# Patient Record
Sex: Female | Born: 1955 | Race: Black or African American | Hispanic: No | Smoking: Never smoker
Health system: Southern US, Community
[De-identification: ages and names within clinical notes are randomized; demographics above are authoritative.]

## PROBLEM LIST (undated history)

## (undated) DIAGNOSIS — K635 Polyp of colon: Secondary | ICD-10-CM

## (undated) DIAGNOSIS — C55 Malignant neoplasm of uterus, part unspecified: Secondary | ICD-10-CM

## (undated) DIAGNOSIS — G629 Polyneuropathy, unspecified: Secondary | ICD-10-CM

## (undated) DIAGNOSIS — E119 Type 2 diabetes mellitus without complications: Secondary | ICD-10-CM

## (undated) DIAGNOSIS — I639 Cerebral infarction, unspecified: Secondary | ICD-10-CM

## (undated) HISTORY — DX: Cerebral infarction, unspecified: I63.9

## (undated) HISTORY — PX: OTHER SURGICAL HISTORY: SHX169

## (undated) HISTORY — PX: ABDOMINAL HYSTERECTOMY: SHX81

## (undated) HISTORY — PX: HERNIA REPAIR: SHX51

## (undated) HISTORY — DX: Polyp of colon: K63.5

## (undated) HISTORY — DX: Malignant neoplasm of uterus, part unspecified: C55

## (undated) HISTORY — DX: Polyneuropathy, unspecified: G62.9

---

## 2019-10-19 ENCOUNTER — Other Ambulatory Visit: Payer: Self-pay

## 2019-10-19 ENCOUNTER — Encounter (HOSPITAL_COMMUNITY): Payer: Self-pay

## 2019-10-19 ENCOUNTER — Inpatient Hospital Stay (HOSPITAL_COMMUNITY)
Admission: EM | Admit: 2019-10-19 | Discharge: 2019-10-24 | DRG: 041 | Disposition: A | Discharge status: Rehabilitation Facility | Payer: Medicare HMO | Attending: Internal Medicine | Admitting: Internal Medicine

## 2019-10-19 ENCOUNTER — Inpatient Hospital Stay (HOSPITAL_COMMUNITY): Payer: Medicare HMO

## 2019-10-19 ENCOUNTER — Emergency Department (HOSPITAL_COMMUNITY): Payer: Medicare HMO

## 2019-10-19 DIAGNOSIS — G629 Polyneuropathy, unspecified: Secondary | ICD-10-CM | POA: Diagnosis not present

## 2019-10-19 DIAGNOSIS — Z833 Family history of diabetes mellitus: Secondary | ICD-10-CM

## 2019-10-19 DIAGNOSIS — R29708 NIHSS score 8: Secondary | ICD-10-CM | POA: Diagnosis present

## 2019-10-19 DIAGNOSIS — R000 Tachycardia, unspecified: Secondary | ICD-10-CM

## 2019-10-19 DIAGNOSIS — Z8673 Personal history of transient ischemic attack (TIA), and cerebral infarction without residual deficits: Secondary | ICD-10-CM

## 2019-10-19 DIAGNOSIS — E1151 Type 2 diabetes mellitus with diabetic peripheral angiopathy without gangrene: Secondary | ICD-10-CM | POA: Diagnosis present

## 2019-10-19 DIAGNOSIS — H548 Legal blindness, as defined in USA: Secondary | ICD-10-CM | POA: Diagnosis present

## 2019-10-19 DIAGNOSIS — Z20822 Contact with and (suspected) exposure to covid-19: Secondary | ICD-10-CM | POA: Diagnosis present

## 2019-10-19 DIAGNOSIS — I63412 Cerebral infarction due to embolism of left middle cerebral artery: Secondary | ICD-10-CM | POA: Diagnosis present

## 2019-10-19 DIAGNOSIS — R509 Fever, unspecified: Secondary | ICD-10-CM

## 2019-10-19 DIAGNOSIS — I471 Supraventricular tachycardia: Secondary | ICD-10-CM | POA: Diagnosis present

## 2019-10-19 DIAGNOSIS — I69320 Aphasia following cerebral infarction: Secondary | ICD-10-CM | POA: Diagnosis not present

## 2019-10-19 DIAGNOSIS — Z803 Family history of malignant neoplasm of breast: Secondary | ICD-10-CM | POA: Diagnosis not present

## 2019-10-19 DIAGNOSIS — R296 Repeated falls: Secondary | ICD-10-CM | POA: Diagnosis present

## 2019-10-19 DIAGNOSIS — E1165 Type 2 diabetes mellitus with hyperglycemia: Secondary | ICD-10-CM | POA: Diagnosis not present

## 2019-10-19 DIAGNOSIS — M792 Neuralgia and neuritis, unspecified: Secondary | ICD-10-CM | POA: Diagnosis not present

## 2019-10-19 DIAGNOSIS — I639 Cerebral infarction, unspecified: Secondary | ICD-10-CM | POA: Diagnosis present

## 2019-10-19 DIAGNOSIS — Z8041 Family history of malignant neoplasm of ovary: Secondary | ICD-10-CM

## 2019-10-19 DIAGNOSIS — Z90710 Acquired absence of both cervix and uterus: Secondary | ICD-10-CM | POA: Diagnosis not present

## 2019-10-19 DIAGNOSIS — Z7902 Long term (current) use of antithrombotics/antiplatelets: Secondary | ICD-10-CM

## 2019-10-19 DIAGNOSIS — E785 Hyperlipidemia, unspecified: Secondary | ICD-10-CM | POA: Diagnosis present

## 2019-10-19 DIAGNOSIS — I10 Essential (primary) hypertension: Secondary | ICD-10-CM | POA: Diagnosis present

## 2019-10-19 DIAGNOSIS — E1142 Type 2 diabetes mellitus with diabetic polyneuropathy: Secondary | ICD-10-CM | POA: Diagnosis present

## 2019-10-19 DIAGNOSIS — D509 Iron deficiency anemia, unspecified: Secondary | ICD-10-CM

## 2019-10-19 DIAGNOSIS — I2510 Atherosclerotic heart disease of native coronary artery without angina pectoris: Secondary | ICD-10-CM | POA: Diagnosis present

## 2019-10-19 DIAGNOSIS — Z8541 Personal history of malignant neoplasm of cervix uteri: Secondary | ICD-10-CM | POA: Diagnosis not present

## 2019-10-19 DIAGNOSIS — D649 Anemia, unspecified: Secondary | ICD-10-CM | POA: Diagnosis present

## 2019-10-19 DIAGNOSIS — E119 Type 2 diabetes mellitus without complications: Secondary | ICD-10-CM

## 2019-10-19 DIAGNOSIS — R739 Hyperglycemia, unspecified: Secondary | ICD-10-CM | POA: Diagnosis not present

## 2019-10-19 DIAGNOSIS — I69351 Hemiplegia and hemiparesis following cerebral infarction affecting right dominant side: Secondary | ICD-10-CM

## 2019-10-19 DIAGNOSIS — Z79899 Other long term (current) drug therapy: Secondary | ICD-10-CM | POA: Diagnosis not present

## 2019-10-19 DIAGNOSIS — Z6841 Body Mass Index (BMI) 40.0 and over, adult: Secondary | ICD-10-CM

## 2019-10-19 DIAGNOSIS — I6300 Cerebral infarction due to thrombosis of unspecified precerebral artery: Secondary | ICD-10-CM | POA: Diagnosis not present

## 2019-10-19 DIAGNOSIS — R32 Unspecified urinary incontinence: Secondary | ICD-10-CM | POA: Diagnosis present

## 2019-10-19 DIAGNOSIS — R7309 Other abnormal glucose: Secondary | ICD-10-CM | POA: Diagnosis not present

## 2019-10-19 DIAGNOSIS — I6389 Other cerebral infarction: Secondary | ICD-10-CM | POA: Diagnosis not present

## 2019-10-19 DIAGNOSIS — Z794 Long term (current) use of insulin: Secondary | ICD-10-CM

## 2019-10-19 DIAGNOSIS — I63512 Cerebral infarction due to unspecified occlusion or stenosis of left middle cerebral artery: Secondary | ICD-10-CM | POA: Diagnosis not present

## 2019-10-19 DIAGNOSIS — I69354 Hemiplegia and hemiparesis following cerebral infarction affecting left non-dominant side: Secondary | ICD-10-CM

## 2019-10-19 DIAGNOSIS — E162 Hypoglycemia, unspecified: Secondary | ICD-10-CM | POA: Diagnosis not present

## 2019-10-19 DIAGNOSIS — E46 Unspecified protein-calorie malnutrition: Secondary | ICD-10-CM | POA: Diagnosis not present

## 2019-10-19 DIAGNOSIS — E8809 Other disorders of plasma-protein metabolism, not elsewhere classified: Secondary | ICD-10-CM | POA: Diagnosis not present

## 2019-10-19 DIAGNOSIS — Z87891 Personal history of nicotine dependence: Secondary | ICD-10-CM

## 2019-10-19 HISTORY — DX: Type 2 diabetes mellitus without complications: E11.9

## 2019-10-19 LAB — URINALYSIS, ROUTINE W REFLEX MICROSCOPIC
Bilirubin Urine: NEGATIVE
Glucose, UA: >=500 mg/dL — AB
Hgb urine dipstick: NEGATIVE
Ketones, ur: 5 mg/dL — AB
Leukocytes,Ua: NEGATIVE
Nitrite: NEGATIVE
Protein, ur: NEGATIVE mg/dL
Specific Gravity, Urine: 1.024 (ref 1.005–1.030)
pH: 6 (ref 5.0–8.0)

## 2019-10-19 LAB — DIFFERENTIAL
Abs Immature Granulocytes: 0.03 10*3/uL (ref 0.00–0.07)
Basophils Absolute: 0.1 10*3/uL (ref 0.0–0.1)
Basophils Relative: 1 %
Eosinophils Absolute: 0.2 10*3/uL (ref 0.0–0.5)
Eosinophils Relative: 3 %
Immature Granulocytes: 0 %
Lymphocytes Relative: 26 %
Lymphs Abs: 2.1 10*3/uL (ref 0.7–4.0)
Monocytes Absolute: 0.6 10*3/uL (ref 0.1–1.0)
Monocytes Relative: 8 %
Neutro Abs: 5 10*3/uL (ref 1.7–7.7)
Neutrophils Relative %: 62 %

## 2019-10-19 LAB — CBC
HCT: 37.6 % (ref 36.0–46.0)
Hemoglobin: 11.5 g/dL — ABNORMAL LOW (ref 12.0–15.0)
MCH: 25.3 pg — ABNORMAL LOW (ref 26.0–34.0)
MCHC: 30.6 g/dL (ref 30.0–36.0)
MCV: 82.6 fL (ref 80.0–100.0)
Platelets: 381 10*3/uL (ref 150–400)
RBC: 4.55 MIL/uL (ref 3.87–5.11)
RDW: 15.4 % (ref 11.5–15.5)
WBC: 8 10*3/uL (ref 4.0–10.5)
nRBC: 0 % (ref 0.0–0.2)

## 2019-10-19 LAB — RAPID URINE DRUG SCREEN, HOSP PERFORMED
Amphetamines: NOT DETECTED
Barbiturates: NOT DETECTED
Benzodiazepines: NOT DETECTED
Cocaine: NOT DETECTED
Opiates: NOT DETECTED
Tetrahydrocannabinol: NOT DETECTED

## 2019-10-19 LAB — COMPREHENSIVE METABOLIC PANEL
ALT: 17 U/L (ref 0–44)
AST: 17 U/L (ref 15–41)
Albumin: 3.3 g/dL — ABNORMAL LOW (ref 3.5–5.0)
Alkaline Phosphatase: 60 U/L (ref 38–126)
Anion gap: 10 (ref 5–15)
BUN: 12 mg/dL (ref 8–23)
CO2: 27 mmol/L (ref 22–32)
Calcium: 8.4 mg/dL — ABNORMAL LOW (ref 8.9–10.3)
Chloride: 100 mmol/L (ref 98–111)
Creatinine, Ser: 0.72 mg/dL (ref 0.44–1.00)
GFR calc Af Amer: >60 mL/min (ref >60)
GFR calc non Af Amer: >60 mL/min (ref >60)
Glucose, Bld: 371 mg/dL — ABNORMAL HIGH (ref 70–99)
Potassium: 4 mmol/L (ref 3.5–5.1)
Sodium: 137 mmol/L (ref 135–145)
Total Bilirubin: 0.5 mg/dL (ref 0.3–1.2)
Total Protein: 7.5 g/dL (ref 6.5–8.1)

## 2019-10-19 LAB — CBG MONITORING, ED: Glucose-Capillary: 282 mg/dL — ABNORMAL HIGH (ref 70–99)

## 2019-10-19 LAB — PROTIME-INR
INR: 1 (ref 0.8–1.2)
Prothrombin Time: 13.2 seconds (ref 11.4–15.2)

## 2019-10-19 LAB — APTT: aPTT: 28 seconds (ref 24–36)

## 2019-10-19 LAB — ETHANOL: Alcohol, Ethyl (B): <10 mg/dL (ref <10)

## 2019-10-19 MED ORDER — DULOXETINE HCL 60 MG PO CPEP
60.0000 mg | ORAL_CAPSULE | Freq: Every day | ORAL | Status: DC
  Administered 2019-10-19 – 2019-10-24 (×6): 60 mg via ORAL
  Filled 2019-10-19 (×2): qty 2
  Filled 2019-10-19 (×4): qty 1

## 2019-10-19 MED ORDER — GABAPENTIN 300 MG PO CAPS
600.0000 mg | ORAL_CAPSULE | Freq: Every day | ORAL | Status: DC
  Administered 2019-10-19 – 2019-10-23 (×5): 600 mg via ORAL
  Filled 2019-10-19 (×5): qty 2

## 2019-10-19 MED ORDER — INSULIN DETEMIR 100 UNIT/ML ~~LOC~~ SOLN
15.0000 [IU] | Freq: Every day | SUBCUTANEOUS | Status: DC
  Administered 2019-10-19 – 2019-10-23 (×5): 15 [IU] via SUBCUTANEOUS
  Filled 2019-10-19 (×7): qty 0.15

## 2019-10-19 MED ORDER — ENOXAPARIN SODIUM 40 MG/0.4ML ~~LOC~~ SOLN
40.0000 mg | Freq: Every day | SUBCUTANEOUS | Status: DC
  Administered 2019-10-19 – 2019-10-20 (×2): 40 mg via SUBCUTANEOUS
  Filled 2019-10-19 (×2): qty 0.4

## 2019-10-19 MED ORDER — ACETAMINOPHEN 325 MG PO TABS
650.0000 mg | ORAL_TABLET | ORAL | Status: DC | PRN
  Administered 2019-10-21 – 2019-10-24 (×8): 650 mg via ORAL
  Filled 2019-10-19 (×7): qty 2

## 2019-10-19 MED ORDER — CLOPIDOGREL BISULFATE 75 MG PO TABS
75.0000 mg | ORAL_TABLET | Freq: Every day | ORAL | Status: DC
  Administered 2019-10-19 – 2019-10-24 (×6): 75 mg via ORAL
  Filled 2019-10-19 (×6): qty 1

## 2019-10-19 MED ORDER — ACETAMINOPHEN 650 MG RE SUPP: 650.0000 mg | RECTAL | Status: DC | PRN

## 2019-10-19 MED ORDER — ATORVASTATIN CALCIUM 40 MG PO TABS
40.0000 mg | ORAL_TABLET | Freq: Every day | ORAL | Status: DC
  Administered 2019-10-19 – 2019-10-24 (×6): 40 mg via ORAL
  Filled 2019-10-19 (×7): qty 1

## 2019-10-19 MED ORDER — ACETAMINOPHEN 160 MG/5ML PO SOLN: 650.0000 mg | ORAL | Status: DC | PRN

## 2019-10-19 MED ORDER — IOHEXOL 350 MG/ML SOLN
100.0000 mL | Freq: Once | INTRAVENOUS | Status: AC | PRN
  Administered 2019-10-19: 100 mL via INTRAVENOUS

## 2019-10-19 MED ORDER — SODIUM CHLORIDE 0.9 % IV SOLN: INTRAVENOUS | Status: DC

## 2019-10-19 MED ORDER — OXYCODONE-ACETAMINOPHEN 5-325 MG PO TABS
2.0000 | ORAL_TABLET | Freq: Four times a day (QID) | ORAL | Status: DC | PRN
  Administered 2019-10-19 – 2019-10-23 (×4): 2 via ORAL
  Filled 2019-10-19 (×4): qty 2

## 2019-10-19 MED ORDER — INSULIN ASPART 100 UNIT/ML ~~LOC~~ SOLN
0.0000 [IU] | Freq: Three times a day (TID) | SUBCUTANEOUS | Status: DC
  Filled 2019-10-19: qty 0.2

## 2019-10-19 MED ORDER — AMITRIPTYLINE HCL 25 MG PO TABS
25.0000 mg | ORAL_TABLET | Freq: Every day | ORAL | Status: DC
  Administered 2019-10-19 – 2019-10-23 (×5): 25 mg via ORAL
  Filled 2019-10-19 (×5): qty 1

## 2019-10-19 MED ORDER — SODIUM CHLORIDE (PF) 0.9 % IJ SOLN
INTRAMUSCULAR | Status: AC
  Administered 2019-10-20: 1 mL
  Filled 2019-10-19: qty 50

## 2019-10-19 MED ORDER — SENNOSIDES-DOCUSATE SODIUM 8.6-50 MG PO TABS: 1.0000 | ORAL_TABLET | Freq: Every evening | ORAL | Status: DC | PRN

## 2019-10-19 MED ORDER — STROKE: EARLY STAGES OF RECOVERY BOOK
Freq: Once | Status: AC
  Filled 2019-10-19: qty 1

## 2019-10-19 NOTE — ED Notes (Signed)
Stroke swallow study was repeated at this time per pt. Request. This time she passed with ease.

## 2019-10-19 NOTE — ED Notes (Signed)
She has just returned from MRI and is in no distress. Her daughter remains with her.

## 2019-10-19 NOTE — ED Notes (Signed)
MRI called, no answer, will call again

## 2019-10-19 NOTE — ED Triage Notes (Signed)
Pt attempted to explain what brought in today but story was confusing. Pts family member reports pt has been confused, incontinence, and unable to control bowels over the last week. Pt has hx of stroke back in 2018.

## 2019-10-19 NOTE — H&P (Signed)
History and Physical    Regina Wolfe [id number] DOB: 1955-12-02 DOA: 10/19/2019  PCP: PCP in [place] Patient coming from: Home, lives in [place] with brother and sister.  Patient is a retired Marine scientist. I have personally briefly reviewed patient's old medical records in [place]  Chief Complaint: weakness, disorientation  HPI: Regina Wolfe is a 64 y.o. 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 Patient's daughter at bedside provides most of the history as she has mild short-term memory issues recalling her symptoms. About 2 weeks ago, daughter noticed that she would repeatedly fall off the bed due to weakness and started to have bowel/urinary incontinent in the bed.  When asked why the patient simply stated that she "cannot remember" to use the bathroom.  Then about a week ago she also began to have trouble word finding and seemed disoriented.  Patient normally is able to do all of her normal ADLs without assistance.  She normally ambulates with a cane. They then took her to an ER in [place] and was just told that she had a TIA and discharged home.  However they felt that she continued to be disoriented and decided to bring her here for better care.  Patient denies having any symptoms and just that she cannot recall what happened this past week.  Currently does not have any complaints of chest pain, palpitation or shortness of breath.  No headache.  No nausea, vomiting or diarrhea abdominal pain.  Has had good appetite.  ED Course: She was afebrile, hypertensive up to systolic of [id number] on room air.  No leukocytosis. Mild anemia with hemoglobin of 11.5 Glucose of 317.   MRI brain showed several acute/early subacture infarcts within the left frontal and  temporal lobes within the left MCA vascular territory.   ED physician discussed case with neurology and they would like her to have further embolic stroke work-up and be transferred to [facility].  Review of Systems:  Constitutional: No Weight Change, No Fever ENT/Mouth: No sore throat, No Rhinorrhea Eyes: No Eye Pain, No Vision Changes Cardiovascular: No Chest Pain, no SOB Respiratory: No Cough, No Sputum, No Wheezing, no Dyspnea  Gastrointestinal: No Nausea, No Vomiting, No Diarrhea, No Constipation, No Pain Genitourinary: + Urinary Incontinence, No Urgency, No Flank Pain Musculoskeletal: No Arthralgias, No Myalgias Skin: No Skin Lesions, No Pruritus, Neuro: +Weakness, No Numbness,  No Loss of Consciousness, No Syncope Psych: No Anxiety/Panic, No Depression, no decrease appetite Heme/Lymph: No Bruising, No Bleeding  Past Medical History:  Diagnosis Date   Diabetes mellitus without complication (HCC)    Stroke [facility])    Surgical history Hysterectomy secondary to cervical cancer C-section Heart catheter in 2006   Denies tobacco, alcohol illicit drug use.   Family history Mother-MI, type 2 diabetes Father-unknown Sisters-ovarian cancer, cervical cancer, leukemia Brother-pacemaker Aunt-breast cancer Maternal grandmother-brain cancer  Prior to Admission medications   Not on File    Physical Exam: Vitals:   10/19/19 1349 10/19/19 1715  BP: (!) 197/96 (!) 182/95  Pulse: (!) 118 97  Resp: 15 18  Temp: 98.4 F (36.9 C) (!) 97.5 F (36.4 C)  TempSrc: Oral Oral  SpO2: 100% 100%    Constitutional: NAD, calm, comfortable, well-appearing morbidly obese female laying at about 30 degree incline in bed Vitals:   10/19/19 1349 10/19/19  1715  BP: (!) 197/96 (!) 182/95  Pulse: (!) 118 97  Resp: 15 18  Temp: 98.4 F (36.9 C) (!) 97.5 F (36.4 C)  TempSrc: Oral Oral  SpO2: 100% 100%   Eyes: PERRL of the right eye but not the left due to legal blindness, lids and  conjunctivae normal ENMT: Mucous membranes are moist.  Neck: normal, supple, no masses, no thyromegaly Respiratory: clear to auscultation bilaterally, no wheezing, no crackles. Normal respiratory effort. No accessory muscle use.  Cardiovascular: Tachycardia with rates up to 110 on telemetry, no murmurs / rubs / gallops. No extremity edema. 2+ pedal pulses. No carotid bruits.  Abdomen: no tenderness, no masses palpated. Bowel sounds positive.  Musculoskeletal: no clubbing / cyanosis. No joint deformity upper and lower extremities. Good ROM, no contractures. Normal muscle tone.  Skin: no rashes, lesions, ulcers. No induration Neurologic: Alert and oriented x4 although unable to share recent symptoms, CN 2-12 grossly intact. Sensation intact. Strength 5/5 in all 4.  Able to name simple objects.  Normal finger-to-nose.  Intact heel-to-shin. Psychiatric: Normal judgment and insight. Alert and oriented x 3. Normal mood.     Labs on Admission: I have personally reviewed following labs and imaging studies  CBC: Recent Labs  Lab 10/19/19 1436  WBC 8.0  NEUTROABS 5.0  HGB 11.5*  HCT 37.6  MCV 82.6  PLT 123XX123   Basic Metabolic Panel: Recent Labs  Lab 10/19/19 1436  NA 137  K 4.0  CL 100  CO2 27  GLUCOSE 371*  BUN 12  CREATININE 0.72  CALCIUM 8.4*   GFR: CrCl cannot be calculated (Unknown ideal weight.). Liver Function Tests: Recent Labs  Lab 10/19/19 1436  AST 17  ALT 17  ALKPHOS 60  BILITOT 0.5  PROT 7.5  ALBUMIN 3.3*   No results for input(s): LIPASE, AMYLASE in the last 168 hours. No results for input(s): AMMONIA in the last 168 hours. Coagulation Profile: Recent Labs  Lab 10/19/19 1436  INR 1.0   Cardiac Enzymes: No results for input(s): CKTOTAL, CKMB, CKMBINDEX, TROPONINI in the last 168 hours. BNP (last 3 results) No results for input(s): PROBNP in the last 8760 hours. HbA1C: No results for input(s): HGBA1C in the last 72 hours. CBG: Recent Labs  Lab  10/19/19 1754  GLUCAP 282*   Lipid Profile: No results for input(s): CHOL, HDL, LDLCALC, TRIG, CHOLHDL, LDLDIRECT in the last 72 hours. Thyroid Function Tests: No results for input(s): TSH, T4TOTAL, FREET4, T3FREE, THYROIDAB in the last 72 hours. Anemia Panel: No results for input(s): VITAMINB12, FOLATE, FERRITIN, TIBC, IRON, RETICCTPCT in the last 72 hours. Urine analysis:    Component Value Date/Time   COLORURINE YELLOW 10/19/2019 1712   APPEARANCEUR CLEAR 10/19/2019 1712   LABSPEC 1.024 10/19/2019 1712   PHURINE 6.0 10/19/2019 1712   GLUCOSEU >=500 (A) 10/19/2019 1712   HGBUR NEGATIVE 10/19/2019 1712   BILIRUBINUR NEGATIVE 10/19/2019 1712   KETONESUR 5 (A) 10/19/2019 1712   PROTEINUR NEGATIVE 10/19/2019 1712   NITRITE NEGATIVE 10/19/2019 1712   LEUKOCYTESUR NEGATIVE 10/19/2019 1712    Radiological Exams on Admission: MR BRAIN WO CONTRAST  Result Date: 10/19/2019 CLINICAL DATA:  Focal neuro deficit, greater than 6 hours, stroke suspected. Additional history provided: Confusion, incontinence, history of stroke in 2018 EXAM: MRI HEAD WITHOUT CONTRAST TECHNIQUE: Multiplanar, multiecho pulse sequences of the brain and surrounding structures were obtained without intravenous contrast. COMPARISON:  No pertinent prior studies available for comparison. FINDINGS: Brain: Intermittently motion degraded examination. Most notably there  is moderate motion degradation of the axial T2 FLAIR and coronal T2 weighted sequences. There are multiple subcentimeter acute/early subacute infarcts within the left MCA vascular territory within the anterior left frontal lobe subcortical white matter (series 11, image 42) and a cortical/subcortical focus within the left frontal lobe motor strip (series 11, image 43), as well as within the anterior left temporal lobe (series 11, image 35). There is an additional subcentimeter acute/early subacute infarct within the body of the corpus callosum (series 11, image 41).  Background advanced patchy and confluent T2/FLAIR hyperintensity within the cerebral white matter and pons is nonspecific, but consistent with chronic small vessel ischemic disease. There are also numerous chronic lacunar infarcts within the bilateral cerebral white matter, basal ganglia, thalami and within the pons. There is also a focus of encephalomalacia within the anterior body/genu of the corpus callosum which may be post ischemic. No evidence of intracranial mass. No midline shift or extra-axial fluid collection. No chronic intracranial blood products. Moderate generalized parenchymal atrophy. Vascular: Flow voids maintained within the proximal large arterial vessels. Skull and upper cervical spine: No focal marrow lesion. Incompletely assessed upper cervical spondylosis. This includes a C3-C4 disc bulge partially effacing the ventral thecal sac and possibly contacting the ventral spinal cord. Sinuses/Orbits: Visualized orbits demonstrate no acute abnormality. No significant paranasal sinus disease or mastoid effusion. IMPRESSION: Intermittently motion degraded examination. Several subcentimeter acute/early subacute infarcts within the left frontal and temporal lobes within the left MCA vascular territory. There is an additional small acute/early subacute infarct within the body of the corpus callosum. Correlate for an embolic phenomenon, possibly central or within the left carotid system. Background advanced chronic small vessel ischemic disease with multiple chronic lacunar infarcts as described. Moderate generalized parenchymal atrophy. Electronically Signed   By: Kellie Simmering DO   On: 10/19/2019 16:53    EKG: Independently reviewed.   Assessment/Plan  Several acute/early subacute infarct of the left frontal and temporal lobe within the left MCA vascular territory with hx of CVA on Plavix   - CTA head and neck - echocardiogram  - continue Plavix and atorvastatin -Obtain A1c and  lipids -PT/OT/SLT -Frequent neuro checks and keep on telemetry -Allow for permissive hypertension with blood pressure treatment as needed only if systolic goes above [id number]  Tachycardia Unknown cause- due to stroke?Marland Kitchen No cardiac symptoms.  Keep on telemetry Will give 75cc IV fluid continuous  HTN allow for permissive HTN for stroke work up  Hold losartan and Coreg  Mild anemia Hgb of 11.5 Will repeat in the morning before further workup  Insulin-dependent type 2 diabetes with peripheral neuropathy Reports taking Humalog 30qHS, Levermir 15qHS and a sliding scale  continue with Levermir 15qHS and resistant SSI  Continue gabapentin, amitriptyline, duloxetine Continue home PRN Percocet  History of CAD No ischemic changes on EKG  DVT prophylaxis:.Lovenox Code Status: Full Family Communication: Plan discussed with patient daughter at bedside  disposition Plan: Home with at least 2 midnight stays  Consults called:  Admission status: inpatient with transportation to [facility] for further neurological work-up of suspected embolic stroke.  Orene Desanctis DO [facility]   If 7PM-7AM, please contact night-coverage [email or web address]   10/19/2019, 7:19 PM

## 2019-10-19 NOTE — ED Provider Notes (Signed)
[facility] DEPT Provider Note   CSN: [id number] Arrival date & time: 10/19/19  1342     History Chief Complaint  Patient presents with  . Altered Mental Status    Regina Wolfe is a 64 y.o. female.  HPI   Patient has a history of diabetes as well as a prior stroke in 2018.  This left the patient with some memory deficits but without other significant functional deficits according to the patient and her daughter.  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  The last couple of weeks however the daughter has noticed a significant change in the patient's behavior.  She has had some episodes of fecal and urinary incontinence.  Patient also has seemed very confused.  At times her speech has been very nonsensical.  She is able to speak clearly but the sentence structure does not take any sense.  The symptoms had been waxing and waning slightly but increasing in severity.  Family was with concerned and brought her to her local hospital in [facility].  Patient states she had an evaluation yesterday that included a CT scan.  They were told it is possible she might of had a TIA and recommended she start taking Plavix.  Family was concerned about the uncertainty of the diagnosis and so they brought her here for further evaluation.  Past Medical History:  Diagnosis Date  . Diabetes mellitus without complication ([place])   . Stroke [facility])     There are no problems to display for this patient.   History reviewed. No pertinent surgical history.   OB History   No obstetric history on file.     History reviewed. No pertinent family history.  Social History   Tobacco Use  . Smoking status: Not on file  Substance Use Topics  . Alcohol use: Not on file  . Drug use: Not on file    Home Medications Prior to Admission medications   Not on File    Allergies    Patient has no allergy information on  record.  Review of Systems   Review of Systems  All other systems reviewed and are negative.   Physical Exam Updated Vital Signs BP (!) 182/95 (BP Location: Left Arm)   Pulse 97   Temp (!) 97.5 F (36.4 C) (Oral)   Resp 18   SpO2 100%   Physical Exam Vitals and nursing note reviewed.  Constitutional:      General: She is not in acute distress.    Appearance: She is well-developed.  HENT:     Head: Normocephalic and atraumatic.     Right Ear: External ear normal.     Left Ear: External ear normal.  Eyes:     General: Visual field deficit present. No scleral icterus.       Right eye: No discharge.        Left eye: No discharge.     Conjunctiva/sclera: Conjunctivae normal.     Comments: Blind left eye (chronic per daughter)  Neck:     Trachea: No tracheal deviation.  Cardiovascular:     Rate and Rhythm: Normal rate and regular rhythm.  Pulmonary:     Effort: Pulmonary effort is normal. No respiratory distress.     Breath sounds: Normal breath sounds. No stridor. No wheezing or rales.  Abdominal:     General: Bowel sounds are normal. There is no distension.     Palpations:  Abdomen is soft.     Tenderness: There is no abdominal tenderness. There is no guarding or rebound.  Musculoskeletal:        General: No tenderness.     Cervical back: Neck supple.  Skin:    General: Skin is warm and dry.     Findings: No rash.  Neurological:     Mental Status: She is alert. She is disoriented.     GCS: GCS eye subscore is 4. GCS verbal subscore is 4. GCS motor subscore is 6.     Cranial Nerves: No cranial nerve deficit (no facial droop, extraocular movements intact, no slurred speech).     Sensory: No sensory deficit.     Motor: No weakness, abnormal muscle tone or seizure activity.     Coordination: Coordination abnormal.     Comments: No pronator drift bilateral upper extrem, able to hold both legs off bed for 5 seconds, sensation intact in all extremities,  no left or right  sided neglect, abnormal finger-nose exam bilaterally, no nystagmus noted      ED Results / Procedures / Treatments   Labs (all labs ordered are listed, but only abnormal results are displayed) Labs Reviewed  CBC - Abnormal; Notable for the following components:      Result Value   Hemoglobin 11.5 (*)    MCH 25.3 (*)    All other components within normal limits  COMPREHENSIVE METABOLIC PANEL - Abnormal; Notable for the following components:   Glucose, Bld 371 (*)    Calcium 8.4 (*)    Albumin 3.3 (*)    All other components within normal limits  URINALYSIS, ROUTINE W REFLEX MICROSCOPIC - Abnormal; Notable for the following components:   Glucose, UA >=500 (*)    Ketones, ur 5 (*)    Bacteria, UA RARE (*)    All other components within normal limits  ETHANOL  PROTIME-INR  APTT  DIFFERENTIAL  RAPID URINE DRUG SCREEN, HOSP PERFORMED  CBG MONITORING, ED    EKG EKG Interpretation  Date/Time:  Sunday October 19 2019 14:45:33 EST Ventricular Rate:  114 PR Interval:    QRS Duration: 101 QT Interval:  351 QTC Calculation: 484 R Axis:   -36 Text Interpretation: Sinus tachycardia Left ventricular hypertrophy Anterior Q waves, possibly due to LVH Minimal ST elevation, lateral leads Baseline wander in lead(s) III V5 V6 No old tracing to compare Confirmed by Dorie Rank [phone]) on 10/19/2019 2:54:48 PM   Radiology MR BRAIN WO CONTRAST  Result Date: 10/19/2019 CLINICAL DATA:  Focal neuro deficit, greater than 6 hours, stroke suspected. Additional history provided: Confusion, incontinence, history of stroke in 2018 EXAM: MRI HEAD WITHOUT CONTRAST TECHNIQUE: Multiplanar, multiecho pulse sequences of the brain and surrounding structures were obtained without intravenous contrast. COMPARISON:  No pertinent prior studies available for comparison. FINDINGS: Brain: Intermittently motion degraded examination. Most notably there is moderate motion degradation of the axial T2 FLAIR and coronal T2  weighted sequences. There are multiple subcentimeter acute/early subacute infarcts within the left MCA vascular territory within the anterior left frontal lobe subcortical white matter (series 11, image 42) and a cortical/subcortical focus within the left frontal lobe motor strip (series 11, image 43), as well as within the anterior left temporal lobe (series 11, image 35). There is an additional subcentimeter acute/early subacute infarct within the body of the corpus callosum (series 11, image 41). Background advanced patchy and confluent T2/FLAIR hyperintensity within the cerebral white matter and pons is nonspecific, but consistent with chronic  small vessel ischemic disease. There are also numerous chronic lacunar infarcts within the bilateral cerebral white matter, basal ganglia, thalami and within the pons. There is also a focus of encephalomalacia within the anterior body/genu of the corpus callosum which may be post ischemic. No evidence of intracranial mass. No midline shift or extra-axial fluid collection. No chronic intracranial blood products. Moderate generalized parenchymal atrophy. Vascular: Flow voids maintained within the proximal large arterial vessels. Skull and upper cervical spine: No focal marrow lesion. Incompletely assessed upper cervical spondylosis. This includes a C3-C4 disc bulge partially effacing the ventral thecal sac and possibly contacting the ventral spinal cord. Sinuses/Orbits: Visualized orbits demonstrate no acute abnormality. No significant paranasal sinus disease or mastoid effusion. IMPRESSION: Intermittently motion degraded examination. Several subcentimeter acute/early subacute infarcts within the left frontal and temporal lobes within the left MCA vascular territory. There is an additional small acute/early subacute infarct within the body of the corpus callosum. Correlate for an embolic phenomenon, possibly central or within the left carotid system. Background advanced  chronic small vessel ischemic disease with multiple chronic lacunar infarcts as described. Moderate generalized parenchymal atrophy. Electronically Signed   By: Kellie Simmering DO   On: 10/19/2019 16:53    Procedures .Critical Care Performed by: Dorie Rank, MD Authorized by: Dorie Rank, MD   Critical care provider statement:    Critical care time (minutes):  30   Critical care was time spent personally by me on the following activities:  Discussions with consultants, evaluation of patient's response to treatment, examination of patient, ordering and performing treatments and interventions, ordering and review of laboratory studies, ordering and review of radiographic studies, pulse oximetry, re-evaluation of patient's condition, obtaining history from patient or surrogate and review of old charts   (including critical care time)  Medications Ordered in ED Medications - No data to display  ED Course  I have reviewed the triage vital signs and the nursing notes.  Pertinent labs & imaging results that were available during my care of the patient were reviewed by me and considered in my medical decision making (see chart for details).  Clinical Course as of Oct 19 1743  Nancy Fetter Oct 19, 2019  1428 Neuro deficits noted.  Sx ongoing for a couple of weeks.  Per report, normal head CT the other day.  Will proceed with MRI   [JK]  1741 D/w Dr Lorraine Lax.  Recommend admission to [facility] hospital for further workup   [JK]    Clinical Course User Index [JK] Dorie Rank, MD   MDM Rules/Calculators/A&P               NIH Stroke Scale: 5      Patient presented with concerns of possible TIA stroke symptoms.  Patient had an abnormal neuro exam with NIH stroke scale of 5.  Patient reportedly was seen in another ED abnormal CT scan so I proceeded to MRI.  Patient MRI shows several subacute/acute areas of infarct in the temporal and frontal lobes on the left side.  Patient will need further work-up.  I will consult  with neuro hospitalist and the medical service for admission and further treatment.  Final Clinical Impression(s) / ED Diagnoses Final diagnoses:  Cerebrovascular accident (CVA), unspecified mechanism ([place])  Hyperglycemia      Dorie Rank, MD 10/19/19 1745

## 2019-10-20 ENCOUNTER — Encounter (HOSPITAL_COMMUNITY): Payer: Self-pay | Admitting: Family Medicine

## 2019-10-20 ENCOUNTER — Inpatient Hospital Stay (HOSPITAL_COMMUNITY): Payer: Medicare HMO

## 2019-10-20 DIAGNOSIS — I6389 Other cerebral infarction: Secondary | ICD-10-CM

## 2019-10-20 DIAGNOSIS — I639 Cerebral infarction, unspecified: Secondary | ICD-10-CM

## 2019-10-20 LAB — LIPID PANEL
Cholesterol: 113 mg/dL (ref 0–200)
HDL: 43 mg/dL (ref >40)
LDL Cholesterol: 57 mg/dL (ref 0–99)
Total CHOL/HDL Ratio: 2.6 RATIO
Triglycerides: 63 mg/dL (ref <150)
VLDL: 13 mg/dL (ref 0–40)

## 2019-10-20 LAB — CBG MONITORING, ED
Glucose-Capillary: 162 mg/dL — ABNORMAL HIGH (ref 70–99)
Glucose-Capillary: 235 mg/dL — ABNORMAL HIGH (ref 70–99)
Glucose-Capillary: 245 mg/dL — ABNORMAL HIGH (ref 70–99)
Glucose-Capillary: 263 mg/dL — ABNORMAL HIGH (ref 70–99)

## 2019-10-20 LAB — HEMOGLOBIN A1C
Hgb A1c MFr Bld: 8.7 % — ABNORMAL HIGH (ref 4.8–5.6)
Mean Plasma Glucose: 202.99 mg/dL

## 2019-10-20 LAB — GLUCOSE, CAPILLARY
Glucose-Capillary: 178 mg/dL — ABNORMAL HIGH (ref 70–99)
Glucose-Capillary: 193 mg/dL — ABNORMAL HIGH (ref 70–99)

## 2019-10-20 LAB — ECHOCARDIOGRAM COMPLETE

## 2019-10-20 LAB — HIV ANTIBODY (ROUTINE TESTING W REFLEX): HIV Screen 4th Generation wRfx: NONREACTIVE

## 2019-10-20 LAB — SARS CORONAVIRUS 2 (TAT 6-24 HRS): SARS Coronavirus 2: NEGATIVE

## 2019-10-20 MED ORDER — PERFLUTREN LIPID MICROSPHERE
1.0000 mL | INTRAVENOUS | Status: AC | PRN
  Administered 2019-10-20: 4 mL via INTRAVENOUS
  Filled 2019-10-20: qty 10

## 2019-10-20 MED ORDER — INSULIN ASPART 100 UNIT/ML ~~LOC~~ SOLN
0.0000 [IU] | Freq: Three times a day (TID) | SUBCUTANEOUS | Status: DC
  Administered 2019-10-20: 7 [IU] via SUBCUTANEOUS
  Administered 2019-10-20: 12:00:00 11 [IU] via SUBCUTANEOUS
  Administered 2019-10-20 – 2019-10-21 (×3): 4 [IU] via SUBCUTANEOUS
  Administered 2019-10-21: 17:00:00 7 [IU] via SUBCUTANEOUS
  Administered 2019-10-21 – 2019-10-22 (×2): 3 [IU] via SUBCUTANEOUS
  Administered 2019-10-23: 07:00:00 4 [IU] via SUBCUTANEOUS
  Administered 2019-10-23 (×2): 11 [IU] via SUBCUTANEOUS
  Administered 2019-10-24: 20 [IU] via SUBCUTANEOUS
  Filled 2019-10-20: qty 0.2

## 2019-10-20 NOTE — ED Notes (Signed)
Pt is currently A&Ox4.  Decision made to allow daughter to visit d/t diagnosis of disorientation, episodes of confusion, and memory impairment.

## 2019-10-20 NOTE — ED Notes (Signed)
Took recliner into room so patient can get up to reposition out of bed as needed.

## 2019-10-20 NOTE — Progress Notes (Signed)
STROKE TEAM PROGRESS NOTE   INTERVAL HISTORY I personally reviewed history of presenting illness with the patient and her sister was present at the bedside.  I reviewed electronic medical records as well as imaging films in PACS personally.  Patient continues to have some expressive aphasia but appears to be improving.  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  She has prior history of stroke with some residual mild left-sided weakness. Vitals:   10/21/19 0415 10/21/19 0839 10/21/19 0839 10/21/19 0841  BP: 123/77 (!) 166/98 (!) 166/98 (!) 166/98  Pulse: 95 (!) 122 (!) 124 (!) 123  Resp: 17  (!) 22 (!) 22  Temp: 98.7 F (37.1 C) 98.5 F (36.9 C) 98.5 F (36.9 C) 98.5 F (36.9 C)  TempSrc: Oral Oral Oral Oral  SpO2: 96% 100%  100%  Weight:      Height:        CBC:  Recent Labs  Lab 10/19/19 1436  WBC 8.0  NEUTROABS 5.0  HGB 11.5*  HCT 37.6  MCV 82.6  PLT [id number]    Basic Metabolic Panel:  Recent Labs  Lab 10/19/19 1436  NA 137  K 4.0  CL 100  CO2 27  GLUCOSE 371*  BUN 12  CREATININE 0.72  CALCIUM 8.4*   Lipid Panel:     Component Value Date/Time   CHOL 113 10/20/2019 0658   TRIG 63 10/20/2019 0658   HDL 43 10/20/2019 0658   CHOLHDL 2.6 10/20/2019 0658   VLDL 13 10/20/2019 0658   LDLCALC 57 10/20/2019 0658   HgbA1c:  Lab Results  Component Value Date   HGBA1C 8.7 (H) 10/19/2019   Urine Drug Screen:     Component Value Date/Time   LABOPIA NONE DETECTED 10/19/2019 1712   COCAINSCRNUR NONE DETECTED 10/19/2019 1712   LABBENZ NONE DETECTED 10/19/2019 1712   AMPHETMU NONE DETECTED 10/19/2019 1712   THCU NONE DETECTED 10/19/2019 1712   LABBARB NONE DETECTED 10/19/2019 1712    Alcohol Level     Component Value Date/Time   ETH <10 10/19/2019 1436    IMAGING past 48 hours CT ANGIO HEAD W OR WO CONTRAST  Result Date: 10/20/2019 CLINICAL DATA:  Confusion. Incontinence. Left MCA strokes seen by MRI. EXAM: CT ANGIOGRAPHY HEAD AND NECK TECHNIQUE:  Multidetector CT imaging of the head and neck was performed using the standard protocol during bolus administration of intravenous contrast. Multiplanar CT image reconstructions and MIPs were obtained to evaluate the vascular anatomy. Carotid stenosis measurements (when applicable) are obtained utilizing NASCET criteria, using the distal internal carotid diameter as the denominator. CONTRAST:  122mL OMNIPAQUE IOHEXOL 350 MG/ML SOLN COMPARISON:  MRI 10/19/2019 FINDINGS: CT HEAD FINDINGS Brain: Old infarction in the pons. No focal cerebellar finding. Cerebral hemispheres show old lacunar infarctions of the thalami, right basal ganglia and affecting the cerebral hemispheric white matter. No large vessel territory infarction. No mass lesion, hemorrhage, hydrocephalus or extra-axial collection. Vascular: There is atherosclerotic calcification of the major vessels at the base of the brain. Skull: Negative Sinuses: Clear Orbits: Normal Review of the MIP images confirms the above findings CTA NECK FINDINGS Aortic arch: Normal Right carotid system: Common carotid artery widely patent the bifurcation. Calcified plaque at the carotid bifurcation and ICA bulb but no stenosis. Cervical ICA widely patent. Left carotid system: Common carotid artery widely patent to the bifurcation. Calcified plaque at the carotid bifurcation and ICA bulb but no stenosis. Cervical ICA widely patent. Vertebral arteries: Both vertebral artery origins widely patent.  Both vertebral arteries widely patent through the cervical region to the foramen magnum. Skeleton: Mild cervical spondylosis. Other neck: No soft tissue mass or lymphadenopathy. Upper chest: Normal Review of the MIP images confirms the above findings CTA HEAD FINDINGS Anterior circulation: Both internal carotid arteries are patent through the skull base and siphon regions. There is calcified plaque in both carotid siphon regions but no stenosis greater than 50% suspected. The anterior and  middle cerebral vessels are patent. No large or medium vessel occlusion is identified. No correctable proximal stenosis. No aneurysm or vascular malformation. Posterior circulation: Both vertebral arteries are patent at the foramen magnum. There is calcified plaque in both vertebral V4 segments but no stenosis greater than 30%. Both vertebral arteries reach the basilar. No basilar stenosis. Posterior circulation branch vessels are patent. Venous sinuses: Patent and normal. Anatomic variants: None significant. Review of the MIP images confirms the above findings IMPRESSION: No large or medium vessel intracranial occlusion. Atherosclerotic disease at both carotid bifurcations but no stenosis or significant irregularity. Atherosclerotic change in both carotid siphon regions and both vertebral artery V4 segments, but no flow limiting stenosis. Electronically Signed   By: Nelson Chimes M.D.   On: 10/20/2019 00:11   CT ANGIO NECK W OR WO CONTRAST  Result Date: 10/20/2019 CLINICAL DATA:  Confusion. Incontinence. Left MCA strokes seen by MRI. EXAM: CT ANGIOGRAPHY HEAD AND NECK TECHNIQUE: Multidetector CT imaging of the head and neck was performed using the standard protocol during bolus administration of intravenous contrast. Multiplanar CT image reconstructions and MIPs were obtained to evaluate the vascular anatomy. Carotid stenosis measurements (when applicable) are obtained utilizing NASCET criteria, using the distal internal carotid diameter as the denominator. CONTRAST:  118mL OMNIPAQUE IOHEXOL 350 MG/ML SOLN COMPARISON:  MRI 10/19/2019 FINDINGS: CT HEAD FINDINGS Brain: Old infarction in the pons. No focal cerebellar finding. Cerebral hemispheres show old lacunar infarctions of the thalami, right basal ganglia and affecting the cerebral hemispheric white matter. No large vessel territory infarction. No mass lesion, hemorrhage, hydrocephalus or extra-axial collection. Vascular: There is atherosclerotic calcification  of the major vessels at the base of the brain. Skull: Negative Sinuses: Clear Orbits: Normal Review of the MIP images confirms the above findings CTA NECK FINDINGS Aortic arch: Normal Right carotid system: Common carotid artery widely patent the bifurcation. Calcified plaque at the carotid bifurcation and ICA bulb but no stenosis. Cervical ICA widely patent. Left carotid system: Common carotid artery widely patent to the bifurcation. Calcified plaque at the carotid bifurcation and ICA bulb but no stenosis. Cervical ICA widely patent. Vertebral arteries: Both vertebral artery origins widely patent. Both vertebral arteries widely patent through the cervical region to the foramen magnum. Skeleton: Mild cervical spondylosis. Other neck: No soft tissue mass or lymphadenopathy. Upper chest: Normal Review of the MIP images confirms the above findings CTA HEAD FINDINGS Anterior circulation: Both internal carotid arteries are patent through the skull base and siphon regions. There is calcified plaque in both carotid siphon regions but no stenosis greater than 50% suspected. The anterior and middle cerebral vessels are patent. No large or medium vessel occlusion is identified. No correctable proximal stenosis. No aneurysm or vascular malformation. Posterior circulation: Both vertebral arteries are patent at the foramen magnum. There is calcified plaque in both vertebral V4 segments but no stenosis greater than 30%. Both vertebral arteries reach the basilar. No basilar stenosis. Posterior circulation branch vessels are patent. Venous sinuses: Patent and normal. Anatomic variants: None significant. Review of the MIP images confirms the  above findings IMPRESSION: No large or medium vessel intracranial occlusion. Atherosclerotic disease at both carotid bifurcations but no stenosis or significant irregularity. Atherosclerotic change in both carotid siphon regions and both vertebral artery V4 segments, but no flow limiting  stenosis. Electronically Signed   By: Nelson Chimes M.D.   On: 10/20/2019 00:11   MR BRAIN WO CONTRAST  Result Date: 10/19/2019 CLINICAL DATA:  Focal neuro deficit, greater than 6 hours, stroke suspected. Additional history provided: Confusion, incontinence, history of stroke in 2018 EXAM: MRI HEAD WITHOUT CONTRAST TECHNIQUE: Multiplanar, multiecho pulse sequences of the brain and surrounding structures were obtained without intravenous contrast. COMPARISON:  No pertinent prior studies available for comparison. FINDINGS: Brain: Intermittently motion degraded examination. Most notably there is moderate motion degradation of the axial T2 FLAIR and coronal T2 weighted sequences. There are multiple subcentimeter acute/early subacute infarcts within the left MCA vascular territory within the anterior left frontal lobe subcortical white matter (series 11, image 42) and a cortical/subcortical focus within the left frontal lobe motor strip (series 11, image 43), as well as within the anterior left temporal lobe (series 11, image 35). There is an additional subcentimeter acute/early subacute infarct within the body of the corpus callosum (series 11, image 41). Background advanced patchy and confluent T2/FLAIR hyperintensity within the cerebral white matter and pons is nonspecific, but consistent with chronic small vessel ischemic disease. There are also numerous chronic lacunar infarcts within the bilateral cerebral white matter, basal ganglia, thalami and within the pons. There is also a focus of encephalomalacia within the anterior body/genu of the corpus callosum which may be post ischemic. No evidence of intracranial mass. No midline shift or extra-axial fluid collection. No chronic intracranial blood products. Moderate generalized parenchymal atrophy. Vascular: Flow voids maintained within the proximal large arterial vessels. Skull and upper cervical spine: No focal marrow lesion. Incompletely assessed upper cervical  spondylosis. This includes a C3-C4 disc bulge partially effacing the ventral thecal sac and possibly contacting the ventral spinal cord. Sinuses/Orbits: Visualized orbits demonstrate no acute abnormality. No significant paranasal sinus disease or mastoid effusion. IMPRESSION: Intermittently motion degraded examination. Several subcentimeter acute/early subacute infarcts within the left frontal and temporal lobes within the left MCA vascular territory. There is an additional small acute/early subacute infarct within the body of the corpus callosum. Correlate for an embolic phenomenon, possibly central or within the left carotid system. Background advanced chronic small vessel ischemic disease with multiple chronic lacunar infarcts as described. Moderate generalized parenchymal atrophy. Electronically Signed   By: Kellie Simmering DO   On: 10/19/2019 16:53   ECHOCARDIOGRAM COMPLETE  Result Date: 10/20/2019    ECHOCARDIOGRAM REPORT   Patient Name:   ERLINDA SARLEY Date of Exam: 10/20/2019 Medical Rec #:  [id number]     Height:       64.0 in Accession #:    [id number]    Weight:       289.0 lb Date of Birth:  17-Dec-1955    BSA:          2.29 m Patient Age:    12 years      BP:           158/83 mmHg Patient Gender: F             HR:           111 bpm. Exam Location:  Inpatient Procedure: 2D Echo, Color Doppler, Cardiac Doppler and Intracardiac            Opacification Agent  Indications:    Stroke i163.9  History:        Patient has no prior history of Echocardiogram examinations.                 CAD; Risk Factors:Hypertension and Diabetes.  Sonographer:    Raquel Sarna Senior RDCS Referring Phys: [id number] Rice T TU  Sonographer Comments: Technically difficult study due to poor echo windows. IMPRESSIONS  1. Left ventricular ejection fraction, by estimation, is 50 to 55%. The left ventricle has low normal function. The left ventricle has no regional wall motion abnormalities. Indeterminate diastolic filling due to E-A fusion.  2.  Right ventricular systolic function is normal. The right ventricular size is normal. Tricuspid regurgitation signal is inadequate for assessing PA pressure.  3. The mitral valve is degenerative. No evidence of mitral valve regurgitation. No evidence of mitral stenosis.  4. The aortic valve is tricuspid. Aortic valve regurgitation is not visualized. No aortic stenosis is present.  5. The inferior vena cava is normal in size with greater than 50% respiratory variability, suggesting right atrial pressure of 3 mmHg. Comparison(s): No prior Echocardiogram. Conclusion(s)/Recomendation(s): No intracardiac source of embolism detected on this transthoracic study. A transesophageal echocardiogram is recommended to exclude cardiac source of embolism if clinically indicated. FINDINGS  Left Ventricle: Left ventricular ejection fraction, by estimation, is 50 to 55%. The left ventricle has low normal function. The left ventricle has no regional wall motion abnormalities. Definity contrast agent was given IV to delineate the left ventricular endocardial borders. The left ventricular internal cavity size was normal in size. There is no left ventricular hypertrophy. Indeterminate diastolic filling due to E-A fusion. Right Ventricle: The right ventricular size is normal. No increase in right ventricular wall thickness. Right ventricular systolic function is normal. Tricuspid regurgitation signal is inadequate for assessing PA pressure. Left Atrium: Left atrial size was normal in size. Right Atrium: Right atrial size was normal in size. Pericardium: A small pericardial effusion is present. The pericardial effusion is circumferential. Mitral Valve: The mitral valve is degenerative in appearance. Mild to moderate mitral annular calcification. No evidence of mitral valve regurgitation. No evidence of mitral valve stenosis. Tricuspid Valve: The tricuspid valve is grossly normal. Tricuspid valve regurgitation is not demonstrated. Aortic  Valve: The aortic valve is tricuspid. Aortic valve regurgitation is not visualized. No aortic stenosis is present. Pulmonic Valve: The pulmonic valve was grossly normal. Pulmonic valve regurgitation is not visualized. No evidence of pulmonic stenosis. Aorta: The aortic root and ascending aorta are structurally normal, with no evidence of dilitation. Venous: The inferior vena cava is normal in size with greater than 50% respiratory variability, suggesting right atrial pressure of 3 mmHg. IAS/Shunts: No atrial level shunt detected by color flow Doppler.  LEFT VENTRICLE PLAX 2D LVIDd:         4.80 cm LVIDs:         3.60 cm LV PW:         0.90 cm LV IVS:        0.90 cm LVOT diam:     2.00 cm LV SV:         33.30 ml LV SV Index:   21.12 LVOT Area:     3.14 cm  RIGHT VENTRICLE RV S prime:     12.90 cm/s TAPSE (M-mode): 2.1 cm LEFT ATRIUM             Index LA diam:        3.60 cm 1.57 cm/m LA Vol (A2C):  49.8 ml 21.77 ml/m LA Vol (A4C):   67.2 ml 29.38 ml/m LA Biplane Vol: 62.7 ml 27.41 ml/m  AORTIC VALVE LVOT Vmax:   62.70 cm/s LVOT Vmean:  46.200 cm/s LVOT VTI:    0.106 m  AORTA Ao Root diam: 2.90 cm Ao Asc diam:  3.20 cm  SHUNTS Systemic VTI:  0.11 m Systemic Diam: 2.00 cm Eleonore Chiquito MD Electronically signed by Eleonore Chiquito MD Signature Date/Time: 10/20/2019/3:39:46 PM    Final     PHYSICAL EXAM Obese middle-aged African-American lady not in distress. . Afebrile. Head is nontraumatic. Neck is supple without bruit.    Cardiac exam no murmur or gallop. Lungs are clear to auscultation. Distal pulses are well felt. Neurological Exam ; ;  She is awake alert she is oriented to place and person.  She has nonfluent speech with mild expressive aphasia and word finding difficulties with some verbal perseveration.  She follows simple one-step midline and two-step commands.  Extraocular movements are full range without nystagmus.  She is blind in the left eye.  She blinks to threat in the right eye not in the left.   Face is symmetric.  Tongue midline.  Motor system exam shows poor cooperation but mild left upper and lower extremity drift.  Mild weakness of left grip and intrinsic hand muscles.  Orbits right over left upper extremity.  Mild weakness of left hip flexors and ankle dorsiflexors.  Tone is symmetric.  Sensation appears intact bilaterally.  Gait not tested.  NIH stroke scale 4.  Baseline premorbid modified Rankin scale 2 ASSESSMENT/PLAN Regina Wolfe is a 64 y.o. 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 Small corpus callosum infarct. Small vessel disease. Atrophy.       CTA head & neck no sign stenosis or occlusion. B ICA bifurcation atherosclerosis. B V4 atherosclerosis.   2D Echo EF 50-55%. No source of embolus   TEE to look for embolic source. Arranged with [facility] for Thursday. If positive for PFO (patent foramen ovale), check bilateral lower extremity venous dopplers to rule out DVT as possible source of stroke. (I have made patient NPO after midnight tonight).  If TEE negative, a [place] electrophysiologist will consult and consider placement of an implantable loop recorder to evaluate for atrial fibrillation as etiology of stroke. This has been explained to patient/family by Dr. Leonie Man and they are agreeable.   LDL 57  HgbA1c 8.7  Lovenox 60 mg sq daily for VTE prophylaxis  clopidogrel 75 mg daily prior to admission, now on aspirin 81 mg daily and  clopidogrel 75 mg daily. continue DAPT x 3 weeks then plavix alone    Therapy recommendations:  pending   Disposition:  pending   Hypertension  BP elevated 190s on arrival  Stable . Permissive hypertension (OK if < 220/120) but gradually normalize in 5-7 days . Long-term BP goal normotensive  Hyperlipidemia  Home meds:  On lipitor 40, resumed in hospital  LDL 57, goal < 70  Continue statin at discharge  Diabetes type II Uncontrolled Peripheral  Neuropathy  HgbA1c 8.7, goal < 7.0  CBGs Recent Labs    10/20/19 2110 10/21/19 0606 10/21/19 0846  GLUCAP 193* 144* 134*      SSI  Other Stroke Risk Factors  Hx stroke/TIA   2018 - put on plavix. Resultant mild expressive aphasia and R sided weakness from last stroke. Walks with cane. No futher details available in EPIC   CAD  Possible obstructive sleep apnea. Patient interested in participating in the [facility] will follow up for possible enrollment. Please contact them at [phone] for any questions.    Other Active Problems  Hx OS retinal detachment w/ resultant blindness  Hx cervical cancer s/p hysterectomy  Anemia   Hospital day # 2 I have personally obtained history,examined this patient, reviewed notes, independently viewed imaging studies, participated in medical decision making and plan of care.ROS completed by me personally and pertinent positives fully documented  I have made any additions or clarifications directly to the above note.  She presented with speech difficulties and left-sided weakness likely due to by cerebral embolic infarcts.  Continue ongoing stroke work-up and evaluate for cardiac source of embolism with TEE and loop recorder.  Patient also appears to be at risk for sleep apnea.  I discussed with patient and sister and she seems interested in considering participation in the sleep smart study for stroke prevention in patients at risk for  obstructive sleep apnea.  Will give her information to review and decide if she wants to participate.  Greater than 50% time during this 35-minute visit was spent on counseling and coordination of care and discussion with patient and his daughter and Dr. Darrick Meigs and answering questions Antony Contras, MD Medical Director [place] Pager: [phone] 10/21/2019 5:01 PM   To contact Stroke Continuity provider, please refer to [email or web address] After hours, contact General Neurology

## 2019-10-20 NOTE — Progress Notes (Signed)
Echocardiogram 2D Echocardiogram has been performed.  [place] Deputy Radwan Cowley 10/20/2019, 1:53 PM

## 2019-10-20 NOTE — ED Notes (Signed)
[facility] called for transport. 

## 2019-10-20 NOTE — Progress Notes (Signed)
PROGRESS NOTE    Tiffanni Bricco  [id number]:[id number] DOB: 1956-04-22 DOA: 10/19/2019 PCP: Patient, No Pcp Per   Brief Narrative:64 y.o. 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 Patient's daughter at bedside provides most of the history as she has mild short-term memory issues recalling her symptoms. About 2 weeks ago, daughter noticed that she would repeatedly fall off the bed due to weakness and started to have bowel/urinary incontinent in the bed.  When asked why the patient simply stated that she "cannot remember" to use the bathroom.  Then about a week ago she also began to have trouble word finding and seemed disoriented.  Patient normally is able to do all of her normal ADLs without assistance.  She normally ambulates with a cane. They then took her to an ER in [place] and was just told that she had a TIA and discharged home.  However they felt that she continued to be disoriented and decided to bring her here for better care.  Patient denies having any symptoms and just that she cannot recall what happened this past week.  Currently does not have any complaints of chest pain, palpitation or shortness of breath.  No headache.  No nausea, vomiting or diarrhea abdominal pain.  Has had good appetite.  ED Course: She was afebrile, hypertensive up to systolic of 190s on room air.  No leukocytosis. Mild anemia with hemoglobin of 11.5 Glucose of 317.   MRI brain showed several acute/early subacture infarcts within the left frontal and temporal lobes within the left MCA vascular territory.   ED physician discussed case with neurology and they would like her to have further embolic stroke work-up and be transferred to [facility].   Assessment & Plan:     Principal Problem:   CVA (cerebral vascular accident) [facility]) Active Problems:   Tachycardia   Essential hypertension   Anemia   Insulin dependent type 2 diabetes mellitus (HCC)   Peripheral neuropathy   CAD (coronary artery disease)   #1 left MCA stroke-continue Plavix and statin. Work-up shows LDL of 57 triglycerides 63 cholesterol 113 Hemoglobin A1c 8.7   MRI Several subcentimeter acute/early subacute infarcts within the left frontal and temporal lobes within the left MCA vascular territory. There is an additional small acute/early subacute infarct within the body of the corpus callosum. Correlate for an embolic phenomenon, possibly central or within the left carotid system.  Background advanced chronic small vessel ischemic disease with multiple chronic lacunar infarcts as described. Moderate generalized parenchymal atrophy.  CT angiogram head and neck no large or medium vessel intracranial occlusion, sclerotic disease at both carotid bifurcation but no stenosis.  Echo done results pending  PT/OT and speech therapy pending  #2 type 2 diabetes complicated with neuropathy- She is on glyburide/metformin and Humalog and Levemir insulin at home.  On Levemir 15 units nightly with resistant SSI CBG (last 3)  Recent Labs    10/20/19 0736 10/20/19 1218 10/20/19 1256  GLUCAP 162* 263* 245*   Continue same for now and adjust the dose as needed.  She is on gabapentin, amitriptyline, duloxetine, and as needed Percocet Consider tapering or stopping some of the above medications prior to discharge.  Patient on her way to [facility] now.  #3 history of essential hypertension-losartan and Coreg on hold to allow permissive hypertension.  Blood pressure is 113/64.  #4 history of CAD -stable continue statin    There is no height or weight on file to calculate BMI.  DVT prophylaxis: Lovenox Code Status: Full code Family Communication:  Disposition Plan: .  Patient came from  home PT consult pending for discharge plans She is admitted with acute to subacute stroke Neurology work-up in progress   Consultants:  Neurology  Procedures: None Antimicrobials: None  Subjective: She is resting in bed awake and alert her speech is fluent anxious to go to [facility] waiting for a bed  Objective: Vitals:   10/20/19 0630 10/20/19 0703 10/20/19 0730 10/20/19 0800  BP: (!) 152/88 (!) 161/77 (!) 141/72 (!) 155/81  Pulse: (!) 104 (!) 114 (!) 103 (!) 110  Resp: 19 19 20  (!) 22  Temp:      TempSrc:      SpO2: 91% 94% 91% 90%    Intake/Output Summary (Last 24 hours) at 10/20/2019 1038 Last data filed at 10/20/2019 0825 Gross per 24 hour  Intake 732.5 ml  Output --  Net 732.5 ml   There were no vitals filed for this visit.  Examination:  General exam: Appears calm and comfortable  Respiratory system: Clear to auscultation. Respiratory effort normal. Cardiovascular system: S1 & S2 heard, RRR. No JVD, murmurs, rubs, gallops or clicks. No pedal edema. Gastrointestinal system: Abdomen is nondistended, soft and nontender. No organomegaly or masses felt. Normal bowel sounds heard. Central nervous system: Awake alert oriented.  Moves all extremities.  Speech is fluent.   Extremities: Symmetric 5 x 5 power. Skin: No rashes, lesions or ulcers Psychiatry: Judgement and insight appear normal. Mood & affect appropriate.     Data Reviewed: I have personally reviewed following labs and imaging studies  CBC: Recent Labs  Lab 10/19/19 1436  WBC 8.0  NEUTROABS 5.0  HGB 11.5*  HCT 37.6  MCV 82.6  PLT 381   Basic Metabolic Panel: Recent Labs  Lab 10/19/19 1436  NA 137  K 4.0  CL 100  CO2 27  GLUCOSE 371*  BUN 12  CREATININE 0.72  CALCIUM 8.4*   GFR: CrCl cannot be calculated (Unknown ideal weight.). Liver Function Tests: Recent Labs  Lab 10/19/19 1436  AST 17  ALT 17  ALKPHOS 60  BILITOT 0.5  PROT 7.5  ALBUMIN 3.3*   No results for input(s):  LIPASE, AMYLASE in the last 168 hours. No results for input(s): AMMONIA in the last 168 hours. Coagulation Profile: Recent Labs  Lab 10/19/19 1436  INR 1.0   Cardiac Enzymes: No results for input(s): CKTOTAL, CKMB, CKMBINDEX, TROPONINI in the last 168 hours. BNP (last 3 results) No results for input(s): PROBNP in the last 8760 hours. HbA1C: Recent Labs    10/19/19 2300  HGBA1C 8.7*   CBG: Recent Labs  Lab 10/19/19 1754 10/20/19 0006 10/20/19 0736  GLUCAP 282* 235* 162*   Lipid Profile: Recent Labs    10/20/19 0658  CHOL 113  HDL 43  LDLCALC 57  TRIG 63  CHOLHDL 2.6   Thyroid Function Tests: No results for input(s): TSH, T4TOTAL, FREET4, T3FREE, THYROIDAB in the last 72 hours. Anemia Panel: No results for input(s): VITAMINB12, FOLATE, FERRITIN, TIBC, IRON, RETICCTPCT in the last 72 hours. Sepsis Labs: No results for input(s): PROCALCITON, LATICACIDVEN in the last 168 hours.  Recent Results (from the past 240 hour(s))  SARS CORONAVIRUS 2 (TAT 6-24 HRS) Nasopharyngeal Nasopharyngeal Swab     Status: None   Collection Time: 10/19/19  6:15 PM   Specimen: Nasopharyngeal Swab  Result Value Ref Range Status   SARS Coronavirus 2 NEGATIVE NEGATIVE Final    Comment: (NOTE) SARS-CoV-2 target nucleic acids are NOT DETECTED. The SARS-CoV-2 RNA is generally detectable in upper and lower respiratory specimens during the acute phase of infection. Negative results do not preclude SARS-CoV-2 infection, do not rule out co-infections with other pathogens, and should not be used as the sole basis for treatment or other patient management decisions. Negative results must be combined with clinical observations, patient history, and epidemiological information. The expected result is Negative. Fact Sheet for Patients: HairSlick.no Fact Sheet for Healthcare Providers: quierodirigir.com This test is not yet approved or cleared  by the [place] FDA and  has been authorized for detection and/or diagnosis of SARS-CoV-2 by FDA under an Emergency Use Authorization (EUA). This EUA will remain  in effect (meaning this test can be used) for the duration of the COVID-19 declaration under Section 56 4(b)(1) of the Act, 21 U.S.C. section 360bbb-3(b)(1), unless the authorization is terminated or revoked sooner. Performed at [facility], [address] [place], [place] [address]          Radiology Studies: CT ANGIO HEAD W OR WO CONTRAST  Result Date: 10/20/2019 CLINICAL DATA:  Confusion. Incontinence. Left MCA strokes seen by MRI. EXAM: CT ANGIOGRAPHY HEAD AND NECK TECHNIQUE: Multidetector CT imaging of the head and neck was performed using the standard protocol during bolus administration of intravenous contrast. Multiplanar CT image reconstructions and MIPs were obtained to evaluate the vascular anatomy. Carotid stenosis measurements (when applicable) are obtained utilizing NASCET criteria, using the distal internal carotid diameter as the denominator. CONTRAST:  OMNIPAQUE IOHEXOL 350 MG/ML SOLN COMPARISON:  MRI 10/19/2019 FINDINGS: CT HEAD FINDINGS Brain: Old infarction in the pons. No focal cerebellar finding. Cerebral hemispheres show old lacunar infarctions of the thalami, right basal ganglia and affecting the cerebral hemispheric white matter. No large vessel territory infarction. No mass lesion, hemorrhage, hydrocephalus or extra-axial collection. Vascular: There is atherosclerotic calcification of the major vessels at the base of the brain. Skull: Negative Sinuses: Clear Orbits: Normal Review of the MIP images confirms the above findings CTA NECK FINDINGS Aortic arch: Normal Right carotid system: Common carotid artery widely patent the bifurcation. Calcified plaque at the carotid bifurcation and ICA bulb but no stenosis. Cervical ICA widely patent. Left carotid system: Common carotid artery widely patent  to the bifurcation. Calcified plaque at the carotid bifurcation and ICA bulb but no stenosis. Cervical ICA widely patent. Vertebral arteries: Both vertebral artery origins widely patent. Both vertebral arteries widely patent through the cervical region to the foramen magnum. Skeleton: Mild cervical spondylosis. Other neck: No soft tissue mass or lymphadenopathy. Upper chest: Normal Review of the MIP images confirms the above findings CTA HEAD FINDINGS Anterior circulation: Both internal carotid arteries are patent through the skull base and siphon regions. There is calcified plaque in both carotid siphon regions but no stenosis greater than 50% suspected. The anterior and middle cerebral vessels are patent. No large or medium vessel occlusion is identified. No correctable proximal stenosis. No aneurysm or vascular malformation. Posterior circulation: Both vertebral arteries are patent at the foramen magnum. There is calcified plaque in both vertebral V4 segments but no stenosis greater than 30%. Both vertebral arteries reach the basilar. No basilar stenosis. Posterior circulation branch vessels are patent. Venous sinuses: Patent and normal. Anatomic variants: None significant. Review of the MIP images confirms the above findings IMPRESSION:  No large or medium vessel intracranial occlusion. Atherosclerotic disease at both carotid bifurcations but no stenosis or significant irregularity. Atherosclerotic change in both carotid siphon regions and both vertebral artery V4 segments, but no flow limiting stenosis. Electronically Signed   By: Paulina Fusi M.D.   On: 10/20/2019 00:11   CT ANGIO NECK W OR WO CONTRAST  Result Date: 10/20/2019 CLINICAL DATA:  Confusion. Incontinence. Left MCA strokes seen by MRI. EXAM: CT ANGIOGRAPHY HEAD AND NECK TECHNIQUE: Multidetector CT imaging of the head and neck was performed using the standard protocol during bolus administration of intravenous contrast. Multiplanar CT image  reconstructions and MIPs were obtained to evaluate the vascular anatomy. Carotid stenosis measurements (when applicable) are obtained utilizing NASCET criteria, using the distal internal carotid diameter as the denominator. CONTRAST:  OMNIPAQUE IOHEXOL 350 MG/ML SOLN COMPARISON:  MRI 10/19/2019 FINDINGS: CT HEAD FINDINGS Brain: Old infarction in the pons. No focal cerebellar finding. Cerebral hemispheres show old lacunar infarctions of the thalami, right basal ganglia and affecting the cerebral hemispheric white matter. No large vessel territory infarction. No mass lesion, hemorrhage, hydrocephalus or extra-axial collection. Vascular: There is atherosclerotic calcification of the major vessels at the base of the brain. Skull: Negative Sinuses: Clear Orbits: Normal Review of the MIP images confirms the above findings CTA NECK FINDINGS Aortic arch: Normal Right carotid system: Common carotid artery widely patent the bifurcation. Calcified plaque at the carotid bifurcation and ICA bulb but no stenosis. Cervical ICA widely patent. Left carotid system: Common carotid artery widely patent to the bifurcation. Calcified plaque at the carotid bifurcation and ICA bulb but no stenosis. Cervical ICA widely patent. Vertebral arteries: Both vertebral artery origins widely patent. Both vertebral arteries widely patent through the cervical region to the foramen magnum. Skeleton: Mild cervical spondylosis. Other neck: No soft tissue mass or lymphadenopathy. Upper chest: Normal Review of the MIP images confirms the above findings CTA HEAD FINDINGS Anterior circulation: Both internal carotid arteries are patent through the skull base and siphon regions. There is calcified plaque in both carotid siphon regions but no stenosis greater than 50% suspected. The anterior and middle cerebral vessels are patent. No large or medium vessel occlusion is identified. No correctable proximal stenosis. No aneurysm or vascular malformation.  Posterior circulation: Both vertebral arteries are patent at the foramen magnum. There is calcified plaque in both vertebral V4 segments but no stenosis greater than 30%. Both vertebral arteries reach the basilar. No basilar stenosis. Posterior circulation branch vessels are patent. Venous sinuses: Patent and normal. Anatomic variants: None significant. Review of the MIP images confirms the above findings IMPRESSION: No large or medium vessel intracranial occlusion. Atherosclerotic disease at both carotid bifurcations but no stenosis or significant irregularity. Atherosclerotic change in both carotid siphon regions and both vertebral artery V4 segments, but no flow limiting stenosis. Electronically Signed   By: Paulina Fusi M.D.   On: 10/20/2019 00:11   MR BRAIN WO CONTRAST  Result Date: 10/19/2019 CLINICAL DATA:  Focal neuro deficit, greater than 6 hours, stroke suspected. Additional history provided: Confusion, incontinence, history of stroke in 2018 EXAM: MRI HEAD WITHOUT CONTRAST TECHNIQUE: Multiplanar, multiecho pulse sequences of the brain and surrounding structures were obtained without intravenous contrast. COMPARISON:  No pertinent prior studies available for comparison. FINDINGS: Brain: Intermittently motion degraded examination. Most notably there is moderate motion degradation of the axial T2 FLAIR and coronal T2 weighted sequences. There are multiple subcentimeter acute/early subacute infarcts within the left MCA vascular territory within the anterior left  frontal lobe subcortical white matter (series 11, image 42) and a cortical/subcortical focus within the left frontal lobe motor strip (series 11, image 43), as well as within the anterior left temporal lobe (series 11, image 35). There is an additional subcentimeter acute/early subacute infarct within the body of the corpus callosum (series 11, image 41). Background advanced patchy and confluent T2/FLAIR hyperintensity within the cerebral white  matter and pons is nonspecific, but consistent with chronic small vessel ischemic disease. There are also numerous chronic lacunar infarcts within the bilateral cerebral white matter, basal ganglia, thalami and within the pons. There is also a focus of encephalomalacia within the anterior body/genu of the corpus callosum which may be post ischemic. No evidence of intracranial mass. No midline shift or extra-axial fluid collection. No chronic intracranial blood products. Moderate generalized parenchymal atrophy. Vascular: Flow voids maintained within the proximal large arterial vessels. Skull and upper cervical spine: No focal marrow lesion. Incompletely assessed upper cervical spondylosis. This includes a C3-C4 disc bulge partially effacing the ventral thecal sac and possibly contacting the ventral spinal cord. Sinuses/Orbits: Visualized orbits demonstrate no acute abnormality. No significant paranasal sinus disease or mastoid effusion. IMPRESSION: Intermittently motion degraded examination. Several subcentimeter acute/early subacute infarcts within the left frontal and temporal lobes within the left MCA vascular territory. There is an additional small acute/early subacute infarct within the body of the corpus callosum. Correlate for an embolic phenomenon, possibly central or within the left carotid system. Background advanced chronic small vessel ischemic disease with multiple chronic lacunar infarcts as described. Moderate generalized parenchymal atrophy. Electronically Signed   By: Jackey Loge DO   On: 10/19/2019 16:53        Scheduled Meds: . amitriptyline  25 mg Oral QHS  . atorvastatin  40 mg Oral q1800  . clopidogrel  75 mg Oral Daily  . DULoxetine  60 mg Oral Daily  . enoxaparin (LOVENOX) injection  40 mg Subcutaneous QHS  . gabapentin  600 mg Oral QHS  . insulin aspart  0-20 Units Subcutaneous TID WC  . insulin detemir  15 Units Subcutaneous QHS   Continuous Infusions: . sodium chloride  Stopped (10/20/19 0825)     LOS: 1 day     Alwyn Ren, MD 10/20/2019, 10:38 AM

## 2019-10-20 NOTE — Progress Notes (Signed)
PT Cancellation Note  Patient Details Name: Regina Wolfe MRN: [id number] DOB: [birth year]   Cancelled Treatment:    Reason Eval/Treat Not Completed: Patient not medically ready, to trnafer to [facility] most likely for stroke W/U. Will  Check back another time.   Claretha Cooper 10/20/2019, 7:18 AM  [place] Pager [phone] Office [phone]

## 2019-10-20 NOTE — ED Notes (Signed)
Neurology updated to bed status that pt may not get Leon bed until sometime in the AM. Dr Rory Percy states he will come to Regina Wolfe to evaluate the pt.

## 2019-10-20 NOTE — ED Notes (Signed)
Called to give report to nurse, was unavailable, awaiting a call back.

## 2019-10-20 NOTE — Consult Note (Signed)
Neurology Consultation  Reason for Consult: Stroke Referring Physician: Dr. Dorie Rank  CC: Weakness and disorientation  History is obtained from: Chart review  HPI: Regina Wolfe is a 64 y.o. 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  Was taken to a hospital in [place] where she was told she had a TIA but she kept getting worse and had difficulty walking and ambulating along with difficulty with her bowel bladder continence for which she was brought into [place] for a second opinion and better care. Other symptoms noted in the HPI were difficulty with memory, difficulty using things and having word finding issues. No family was available at bedside at the time of this review and patient was very drowsy and could not provide much of the history. History was obtained from the chart. In the emergency room, patient was hypertensive to the systolic highs in the [id number], saturating well on room air, hyper glycemic with glucose 317. MRI brain was done that showed acute/early subacute infarcts in the left frontal and temporal lobes within the left MCA territory. Neurological consultation was obtained for further work-up  Of note patient had received Cymbalta, Elavil, gabapentin and oxycodone as her night dosages and was very difficult to interview.   LKW: 1 to 2 weeks ago tpa given?: no, outside the window Premorbid modified Rankin scale (mRS): Unable to reliably ascertain  ROS:  Unable to obtain due to altered mental status.   Past Medical History:  Diagnosis Date  . Diabetes mellitus without complication ([place])   . Stroke [facility])    Family history Unable to provide-per H&P mother had MI and type 2 diabetes, unknown history in father, ovarian and cervical cancer along with leukemia and sisters, heart disease in  brother.  Social History:   has no history on file for tobacco, alcohol, and drug. Per H&P no tobacco or illicit drug use or alcohol history.  Medications  Current Facility-Administered Medications:  .  0.9 %  sodium chloride infusion, , Intravenous, Continuous, Tu, Ching T, DO, Last Rate: 75 mL/hr at 10/19/19 2239, New Bag at 10/19/19 2239 .  acetaminophen (TYLENOL) tablet 650 mg, 650 mg, Oral, Q4H PRN **OR** acetaminophen (TYLENOL) 160 MG/5ML solution 650 mg, 650 mg, Per Tube, Q4H PRN **OR** acetaminophen (TYLENOL) suppository 650 mg, 650 mg, Rectal, Q4H PRN, Tu, Ching T, DO .  amitriptyline (ELAVIL) tablet 25 mg, 25 mg, Oral, QHS, Tu, Ching T, DO, 25 mg at 10/19/19 2234 .  atorvastatin (LIPITOR) tablet 40 mg, 40 mg, Oral, q1800, Tu, Ching T, DO, 40 mg at 10/19/19 2235 .  clopidogrel (PLAVIX) tablet 75 mg, 75 mg, Oral, Daily, Tu, Ching T, DO, 75 mg at 10/19/19 2234 .  DULoxetine (CYMBALTA) DR capsule 60 mg, 60 mg, Oral, Daily, Tu, Ching T, DO, 60 mg at 10/19/19 2236 .  enoxaparin (LOVENOX) injection 40 mg, 40 mg, Subcutaneous, QHS, Tu, Ching T, DO, 40 mg at 10/19/19 2235 .  gabapentin (NEURONTIN) capsule 600 mg, 600 mg, Oral, QHS, Tu, Ching T, DO, 600 mg at 10/19/19 2234 .  insulin aspart (novoLOG) injection 0-20 Units, 0-20 Units, Subcutaneous, TID WC, Tu, Ching T, DO, 7 Units at 10/20/19 0141 .  insulin detemir (LEVEMIR) injection 15 Units, 15 Units, Subcutaneous, QHS, Tu, Ching T, DO, 15 Units at 10/19/19 2235 .  oxyCODONE-acetaminophen (PERCOCET/ROXICET) 5-325 MG per tablet 2 tablet, 2 tablet, Oral,  Q6H PRN, Tu, Ching T, DO, 2 tablet at 10/19/19 2037 .  senna-docusate (Senokot-S) tablet 1 tablet, 1 tablet, Oral, QHS PRN, Tu, Ching T, DO  Current Outpatient Medications:  .  amitriptyline (ELAVIL) 50 MG tablet, Take 25 mg by mouth at bedtime., Disp: , Rfl:  .  atorvastatin (LIPITOR) 40 MG tablet, Take 40 mg by mouth at bedtime., Disp: , Rfl:  .  carvedilol (COREG) 25 MG tablet, Take 25  mg by mouth 2 (two) times daily., Disp: , Rfl:  .  clopidogrel (PLAVIX) 75 MG tablet, Take 75 mg by mouth daily., Disp: , Rfl:  .  DULoxetine (CYMBALTA) 60 MG capsule, Take 60 mg by mouth daily., Disp: , Rfl:  .  gabapentin (NEURONTIN) 600 MG tablet, Take 600 mg by mouth at bedtime., Disp: , Rfl:  .  glyBURIDE-metformin (GLUCOVANCE) 5-500 MG tablet, Take 2 tablets by mouth 2 (two) times daily., Disp: , Rfl:  .  insulin detemir (LEVEMIR) 100 UNIT/ML injection, Inject 15 Units into the skin every evening., Disp: , Rfl:  .  insulin lispro (HUMALOG) 100 UNIT/ML injection, Inject 15-30 Units into the skin 3 (three) times daily with meals. Inject 15 units if blood sugar is below 350mg /dL; Inject 30 units if blood sugar is above 350mg /dL, Disp: , Rfl:  .  losartan (COZAAR) 100 MG tablet, Take 100 mg by mouth daily., Disp: , Rfl:  .  oxyCODONE-acetaminophen (PERCOCET) 10-325 MG tablet, Take 1 tablet by mouth every 6 (six) hours as needed for pain. , Disp: , Rfl:  .  vitamin B-12 (CYANOCOBALAMIN) 100 MCG tablet, Take 100 mcg by mouth daily., Disp: , Rfl:  .  Vitamin D, Ergocalciferol, (DRISDOL) 1.25 MG (50000 UNIT) CAPS capsule, Take 50,000 Units by mouth once a week., Disp: , Rfl:    Exam: Current vital signs: BP 119/68   Pulse (!) 113   Temp (!) 97.5 F (36.4 C) (Oral)   Resp 19   SpO2 93%  Vital signs in last 24 hours: Temp:  [97.5 F (36.4 C)-98.4 F (36.9 C)] 97.5 F (36.4 C) (02/14 1715) Pulse Rate:  [97-119] 113 (02/15 0330) Resp:  [15-28] 19 (02/15 0330) BP: (119-197)/(61-99) 119/68 (02/15 0330) SpO2:  [93 %-100 %] 93 % (02/15 0330) General: Sleeping, opens eyes and keeps falling asleep during the entire duration of this interview. HEENT: Normocephalic atraumatic CVS: Regular rate rhythm Respiratory: Breathing well and saturating normally on room air Abdomen: Obese, nontender Extremities: Trace edema bilaterally Neurological exam Very sleepy and drowsy, opens eyes to voice, follows  some simple commands and then falls asleep. Only answers yes or no questions. Difficult to assess for aphasia or dysarthria Cranial nerves: Left pupil is sluggish but reactive, right pupil round reactive to light, mildly disconjugate gaze, oculocephalics present, no facial asymmetry noted. Motor exam: Moves all 4 to command but unable to lift them up for 10 and 5 seconds to do a formal NIH stroke scale at this time. Sensory exam: Grimaces to noxious stimulation in all fours Unable to assess coordination NIH stroke scale 1a Level of Conscious.: 1 1b LOC Questions: 2 1c LOC Commands: 0 2 Best Gaze: 1 3 Visual: 0 4 Facial Palsy: 0 5a Motor Arm - left: 1 5b Motor Arm - Right: 1 6a Motor Leg - Left: 1 6b Motor Leg - Right: 1 7 Limb Ataxia: 0 8 Sensory: 0 9 Best Language: 0 10 Dysarthria: 0 11 Extinct. and Inatten.: 0 TOTAL: 8  Labs I have reviewed labs in epic  and the results pertinent to this consultation are:  CBC    Component Value Date/Time   WBC 8.0 10/19/2019 1436   RBC 4.55 10/19/2019 1436   HGB 11.5 (L) 10/19/2019 1436   HCT 37.6 10/19/2019 1436   PLT 381 10/19/2019 1436   MCV 82.6 10/19/2019 1436   MCH 25.3 (L) 10/19/2019 1436   MCHC 30.6 10/19/2019 1436   RDW 15.4 10/19/2019 1436   LYMPHSABS 2.1 10/19/2019 1436   MONOABS 0.6 10/19/2019 1436   EOSABS 0.2 10/19/2019 1436   BASOSABS 0.1 10/19/2019 1436    CMP     Component Value Date/Time   NA 137 10/19/2019 1436   K 4.0 10/19/2019 1436   CL 100 10/19/2019 1436   CO2 27 10/19/2019 1436   GLUCOSE 371 (H) 10/19/2019 1436   BUN 12 10/19/2019 1436   CREATININE 0.72 10/19/2019 1436   CALCIUM 8.4 (L) 10/19/2019 1436   PROT 7.5 10/19/2019 1436   ALBUMIN 3.3 (L) 10/19/2019 1436   AST 17 10/19/2019 1436   ALT 17 10/19/2019 1436   ALKPHOS 60 10/19/2019 1436   BILITOT 0.5 10/19/2019 1436   GFRNONAA >60 10/19/2019 1436   GFRAA >60 10/19/2019 1436   Imaging I have reviewed the images obtained:  MRI  examination of the brain reveals multiple punctate acute/early subacute infarcts within the left frontal and temporal lobes in the left MCA territory.  There is additional acute early subacute infarct within the body of the corpus callosum.  Background advanced chronic small vessel ischemic disease with multiple chronic lacunar infarcts noted.  CTA head and neck showed no large or medium vessel intracranial occlusion.  Atherosclerotic disease at both carotid bifurcations but no stenosis or significant irregularity.  Atherosclerotic change in both carotid siphon regions in both vertebral artery segments V4-but no flow-limiting stenosis  Assessment: 64 year old woman with above past medical history with ongoing weakness and confusion for the past 1 to 2 weeks, seen at an outside hospital and symptoms attributed to a TIA presenting for worsening of word finding difficulty, confusion as well. Bowel and bladder incontinence noted to have multiple punctate areas of restricted diffusion in the left hemisphere in the left MCA territory. No significant carotid stenosis or occlusion on that side. No emergent large vessel occlusion on either side. Suspect a cardioembolic source. Needs further work-up  Of note, patient was seen and examined at the emergency room at [facility] where she had been given her nighttime doses of Elavil, gabapentin, Cymbalta and opiates and was extremely drowsy during the exam.    Impression: Acute ischemic stroke  Recommendations: Admit to hospitalist For now continue Plavix. Attending work-up will make a decision on dual antiplatelet versus anticoagulation if A. fib is revealed-findings suspicious for cardioembolic source. Atorvastatin 80 2D echo A1c Lipid panel Frequent neurochecks Telemetry PT OT speech therapy N.p.o. until cleared by bedside stroke swallow screen or formal swallow evaluation Might need outpatient cardiac monitoring if inpatient work-up is  unrevealing of her source. Stroke team will follow with you upon transfer to [facility] d/w bedside RN, questions answered.  Await family arrival in the AM for more detailed history.  -- Amie Portland, MD [facility] Pager: [phone] If 7pm to 7am, please call on call as listed on AMION.

## 2019-10-20 NOTE — ED Notes (Signed)
This RN went over stoke recovery book with patient. Patient's goals for stroke risk factor reduction is keeping her BP and CBG down (hx of HTN & Type 2 DM). Patient also states she wants to improve her diet and exercise more (specifically more walks).

## 2019-10-21 ENCOUNTER — Inpatient Hospital Stay (HOSPITAL_COMMUNITY): Payer: Medicare HMO

## 2019-10-21 ENCOUNTER — Encounter (HOSPITAL_COMMUNITY): Payer: Self-pay | Admitting: Family Medicine

## 2019-10-21 LAB — GLUCOSE, CAPILLARY
Glucose-Capillary: 144 mg/dL — ABNORMAL HIGH (ref 70–99)
Glucose-Capillary: 134 mg/dL — ABNORMAL HIGH (ref 70–99)
Glucose-Capillary: 172 mg/dL — ABNORMAL HIGH (ref 70–99)
Glucose-Capillary: 240 mg/dL — ABNORMAL HIGH (ref 70–99)
Glucose-Capillary: 214 mg/dL — ABNORMAL HIGH (ref 70–99)

## 2019-10-21 LAB — URINALYSIS, ROUTINE W REFLEX MICROSCOPIC
Bacteria, UA: NONE SEEN
Bilirubin Urine: NEGATIVE
Glucose, UA: >=500 mg/dL — AB
Hgb urine dipstick: NEGATIVE
Ketones, ur: NEGATIVE mg/dL
Nitrite: POSITIVE — AB
Protein, ur: NEGATIVE mg/dL
Specific Gravity, Urine: 1.014 (ref 1.005–1.030)
pH: 6 (ref 5.0–8.0)

## 2019-10-21 MED ORDER — CARVEDILOL 12.5 MG PO TABS
25.0000 mg | ORAL_TABLET | Freq: Two times a day (BID) | ORAL | Status: DC
  Administered 2019-10-21 – 2019-10-24 (×7): 25 mg via ORAL
  Filled 2019-10-21 (×7): qty 2

## 2019-10-21 MED ORDER — ENOXAPARIN SODIUM 60 MG/0.6ML ~~LOC~~ SOLN
60.0000 mg | Freq: Every day | SUBCUTANEOUS | Status: DC
  Administered 2019-10-21 – 2019-10-23 (×3): 60 mg via SUBCUTANEOUS
  Filled 2019-10-21 (×3): qty 0.6

## 2019-10-21 NOTE — Progress Notes (Signed)
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  Patient has been having short-term memory issues.  Patient started having difficulty finding words and seemed disoriented.    Subjective   Patient seen and examined, denies any chest pain or shortness of breath.  Mildly confused this morning.   Assessment/Plan:     1. Left MCA stroke-MRI showed several subcentimeter acute/early subacute infarcts within the left frontal and temporal lobes there is additional small acute/early subacute infarct within the body of corpus callosum.  CT angiogram head and neck showed no large or medium vessel intracranial occlusion, sclerotic disease at both carotid bifurcation but no stenosis.  Neurology is following.  Continue Plavix, atorvastatin.  Echocardiogram showed EF 50 to 55%, indeterminate diastolic filling. 2. Diabetes mellitus type 2-continue Levemir 50 units subcu daily, sliding scale insulin with NovoLog.  CBG well controlled. 3. Hypertension-losartan and Coreg on hold for permissive hypertension.  Blood pressure stable. 4. History of CAD-stable, continue statin.    SpO2: 100 %   COVID-19 Labs  No results for input(s): DDIMER, FERRITIN, LDH, CRP in the last 72 hours.  Lab Results  Component Value Date   [place] NEGATIVE 10/19/2019     CBG: Recent Labs  Lab 10/20/19 1256 10/20/19 1634 10/20/19 2110 10/21/19 0606 10/21/19 0846  GLUCAP 245* 178* 193* 144* 134*    CBC: Recent Labs  Lab 10/19/19 1436  WBC 8.0  NEUTROABS 5.0  HGB 11.5*  HCT 37.6  MCV 82.6  PLT [id number]    Basic Metabolic Panel: Recent Labs  Lab 10/19/19 1436  NA 137  K 4.0  CL 100  CO2 27   GLUCOSE 371*  BUN 12  CREATININE 0.72  CALCIUM 8.4*     Liver Function Tests: Recent Labs  Lab 10/19/19 1436  AST 17  ALT 17  ALKPHOS 60  BILITOT 0.5  PROT 7.5  ALBUMIN 3.3*        DVT prophylaxis: Lovenox  Code Status: Full code  Family Communication: No family at bedside  Disposition Plan: likely home when medically ready for discharge         Scheduled medications:  . amitriptyline  25 mg Oral QHS  . atorvastatin  40 mg Oral q1800  . clopidogrel  75 mg Oral Daily  . DULoxetine  60 mg Oral Daily  . enoxaparin (LOVENOX) injection  60 mg Subcutaneous QHS  . gabapentin  600 mg Oral QHS  . insulin aspart  0-20 Units Subcutaneous TID WC  . insulin detemir  15 Units Subcutaneous QHS    Consultants:  Neurology  Procedures:  Echocardiogram  Antibiotics:   Anti-infectives (From admission, onward)   None       Objective   Vitals:   10/21/19 0415 10/21/19 0839 10/21/19 0839 10/21/19 0841  BP: 123/77 (!) 166/98 (!) 166/98 (!) 166/98  Pulse: 95 (!) 122 (!) 124 (!) 123  Resp: 17  (!) 22 (!) 22  Temp: 98.7 F (37.1 C) 98.5 F (36.9 C) 98.5 F (36.9 C) 98.5 F (36.9 C)  TempSrc: Oral Oral Oral Oral  SpO2: 96% 100%  100%  Weight:      Height:        Intake/Output Summary (Last 24 hours) at 10/21/2019  [place] filed at 10/21/2019 0845 Gross per 24 hour  Intake 480 ml  Output 600 ml  Net -120 ml    02/14 1901 - 02/16 0700 In: 1212.5 [P.O.:480; I.V.:732.5] Out: 300 [Urine:300]  Filed Weights   10/20/19 2115  Weight: 128.3 kg    Physical Examination:    General-appears in no acute distress  Heart-S1-S2, regular, no murmur auscultated  Lungs-clear to auscultation bilaterally, no wheezing or crackles auscultated  Abdomen-soft, nontender, no organomegaly  Extremities-no edema in the lower extremities  Neuro-alert, oriented x3, no focal deficit noted    Data Reviewed:   Recent Results (from the past 240 hour(s))   SARS CORONAVIRUS 2 (TAT 6-24 HRS) Nasopharyngeal Nasopharyngeal Swab     Status: None   Collection Time: 10/19/19  6:15 PM   Specimen: Nasopharyngeal Swab  Result Value Ref Range Status   SARS Coronavirus 2 NEGATIVE NEGATIVE Final    Comment: (NOTE) SARS-CoV-2 target nucleic acids are NOT DETECTED. The SARS-CoV-2 RNA is generally detectable in upper and lower respiratory specimens during the acute phase of infection. Negative results do not preclude SARS-CoV-2 infection, do not rule out co-infections with other pathogens, and should not be used as the sole basis for treatment or other patient management decisions. Negative results must be combined with clinical observations, patient history, and epidemiological information. The expected result is Negative. Fact Sheet for Patients: SugarRoll.be Fact Sheet for Healthcare Providers: https://www.woods-mathews.com/ This test is not yet approved or cleared by the [place] FDA and  has been authorized for detection and/or diagnosis of SARS-CoV-2 by FDA under an Emergency Use Authorization (EUA). This EUA will remain  in effect (meaning this test can be used) for the duration of the COVID-19 declaration under Section 56 4(b)(1) of the Act, 21 U.S.C. section 360bbb-3(b)(1), unless the authorization is terminated or revoked sooner. Performed at [facility], [place] [address], [place], [place] [address]     No results for input(s): LIPASE, AMYLASE in the last 168 hours. No results for input(s): AMMONIA in the last 168 hours.  Cardiac Enzymes: No results for input(s): CKTOTAL, CKMB, CKMBINDEX, TROPONINI in the last 168 hours. BNP (last 3 results) No results for input(s): BNP in the last 8760 hours.  ProBNP (last 3 results) No results for input(s): PROBNP in the last 8760 hours.  Studies:  CT ANGIO HEAD W OR WO CONTRAST  Result Date: 10/20/2019 CLINICAL DATA:  Confusion. Incontinence.  Left MCA strokes seen by MRI. EXAM: CT ANGIOGRAPHY HEAD AND NECK TECHNIQUE: Multidetector CT imaging of the head and neck was performed using the standard protocol during bolus administration of intravenous contrast. Multiplanar CT image reconstructions and MIPs were obtained to evaluate the vascular anatomy. Carotid stenosis measurements (when applicable) are obtained utilizing NASCET criteria, using the distal internal carotid diameter as the denominator. CONTRAST:  167mL OMNIPAQUE IOHEXOL 350 MG/ML SOLN COMPARISON:  MRI 10/19/2019 FINDINGS: CT HEAD FINDINGS Brain: Old infarction in the pons. No focal cerebellar finding. Cerebral hemispheres show old lacunar infarctions of the thalami, right basal ganglia and affecting the cerebral hemispheric white matter. No large vessel territory infarction. No mass lesion, hemorrhage, hydrocephalus or extra-axial collection. Vascular: There is atherosclerotic calcification of the major vessels at the base of the brain. Skull: Negative Sinuses: Clear Orbits: Normal Review of the MIP images confirms the above findings CTA NECK FINDINGS Aortic arch: Normal Right carotid system: Common carotid artery widely patent the bifurcation. Calcified plaque at the carotid bifurcation and ICA bulb but no  stenosis. Cervical ICA widely patent. Left carotid system: Common carotid artery widely patent to the bifurcation. Calcified plaque at the carotid bifurcation and ICA bulb but no stenosis. Cervical ICA widely patent. Vertebral arteries: Both vertebral artery origins widely patent. Both vertebral arteries widely patent through the cervical region to the foramen magnum. Skeleton: Mild cervical spondylosis. Other neck: No soft tissue mass or lymphadenopathy. Upper chest: Normal Review of the MIP images confirms the above findings CTA HEAD FINDINGS Anterior circulation: Both internal carotid arteries are patent through the skull base and siphon regions. There is calcified plaque in both carotid  siphon regions but no stenosis greater than 50% suspected. The anterior and middle cerebral vessels are patent. No large or medium vessel occlusion is identified. No correctable proximal stenosis. No aneurysm or vascular malformation. Posterior circulation: Both vertebral arteries are patent at the foramen magnum. There is calcified plaque in both vertebral V4 segments but no stenosis greater than 30%. Both vertebral arteries reach the basilar. No basilar stenosis. Posterior circulation branch vessels are patent. Venous sinuses: Patent and normal. Anatomic variants: None significant. Review of the MIP images confirms the above findings IMPRESSION: No large or medium vessel intracranial occlusion. Atherosclerotic disease at both carotid bifurcations but no stenosis or significant irregularity. Atherosclerotic change in both carotid siphon regions and both vertebral artery V4 segments, but no flow limiting stenosis. Electronically Signed   By: Nelson Chimes M.D.   On: 10/20/2019 00:11   CT ANGIO NECK W OR WO CONTRAST  Result Date: 10/20/2019 CLINICAL DATA:  Confusion. Incontinence. Left MCA strokes seen by MRI. EXAM: CT ANGIOGRAPHY HEAD AND NECK TECHNIQUE: Multidetector CT imaging of the head and neck was performed using the standard protocol during bolus administration of intravenous contrast. Multiplanar CT image reconstructions and MIPs were obtained to evaluate the vascular anatomy. Carotid stenosis measurements (when applicable) are obtained utilizing NASCET criteria, using the distal internal carotid diameter as the denominator. CONTRAST:  145mL OMNIPAQUE IOHEXOL 350 MG/ML SOLN COMPARISON:  MRI 10/19/2019 FINDINGS: CT HEAD FINDINGS Brain: Old infarction in the pons. No focal cerebellar finding. Cerebral hemispheres show old lacunar infarctions of the thalami, right basal ganglia and affecting the cerebral hemispheric white matter. No large vessel territory infarction. No mass lesion, hemorrhage, hydrocephalus  or extra-axial collection. Vascular: There is atherosclerotic calcification of the major vessels at the base of the brain. Skull: Negative Sinuses: Clear Orbits: Normal Review of the MIP images confirms the above findings CTA NECK FINDINGS Aortic arch: Normal Right carotid system: Common carotid artery widely patent the bifurcation. Calcified plaque at the carotid bifurcation and ICA bulb but no stenosis. Cervical ICA widely patent. Left carotid system: Common carotid artery widely patent to the bifurcation. Calcified plaque at the carotid bifurcation and ICA bulb but no stenosis. Cervical ICA widely patent. Vertebral arteries: Both vertebral artery origins widely patent. Both vertebral arteries widely patent through the cervical region to the foramen magnum. Skeleton: Mild cervical spondylosis. Other neck: No soft tissue mass or lymphadenopathy. Upper chest: Normal Review of the MIP images confirms the above findings CTA HEAD FINDINGS Anterior circulation: Both internal carotid arteries are patent through the skull base and siphon regions. There is calcified plaque in both carotid siphon regions but no stenosis greater than 50% suspected. The anterior and middle cerebral vessels are patent. No large or medium vessel occlusion is identified. No correctable proximal stenosis. No aneurysm or vascular malformation. Posterior circulation: Both vertebral arteries are patent at the foramen magnum. There is calcified plaque in both  vertebral V4 segments but no stenosis greater than 30%. Both vertebral arteries reach the basilar. No basilar stenosis. Posterior circulation branch vessels are patent. Venous sinuses: Patent and normal. Anatomic variants: None significant. Review of the MIP images confirms the above findings IMPRESSION: No large or medium vessel intracranial occlusion. Atherosclerotic disease at both carotid bifurcations but no stenosis or significant irregularity. Atherosclerotic change in both carotid siphon  regions and both vertebral artery V4 segments, but no flow limiting stenosis. Electronically Signed   By: Nelson Chimes M.D.   On: 10/20/2019 00:11   MR BRAIN WO CONTRAST  Result Date: 10/19/2019 CLINICAL DATA:  Focal neuro deficit, greater than 6 hours, stroke suspected. Additional history provided: Confusion, incontinence, history of stroke in 2018 EXAM: MRI HEAD WITHOUT CONTRAST TECHNIQUE: Multiplanar, multiecho pulse sequences of the brain and surrounding structures were obtained without intravenous contrast. COMPARISON:  No pertinent prior studies available for comparison. FINDINGS: Brain: Intermittently motion degraded examination. Most notably there is moderate motion degradation of the axial T2 FLAIR and coronal T2 weighted sequences. There are multiple subcentimeter acute/early subacute infarcts within the left MCA vascular territory within the anterior left frontal lobe subcortical white matter (series 11, image 42) and a cortical/subcortical focus within the left frontal lobe motor strip (series 11, image 43), as well as within the anterior left temporal lobe (series 11, image 35). There is an additional subcentimeter acute/early subacute infarct within the body of the corpus callosum (series 11, image 41). Background advanced patchy and confluent T2/FLAIR hyperintensity within the cerebral white matter and pons is nonspecific, but consistent with chronic small vessel ischemic disease. There are also numerous chronic lacunar infarcts within the bilateral cerebral white matter, basal ganglia, thalami and within the pons. There is also a focus of encephalomalacia within the anterior body/genu of the corpus callosum which may be post ischemic. No evidence of intracranial mass. No midline shift or extra-axial fluid collection. No chronic intracranial blood products. Moderate generalized parenchymal atrophy. Vascular: Flow voids maintained within the proximal large arterial vessels. Skull and upper cervical  spine: No focal marrow lesion. Incompletely assessed upper cervical spondylosis. This includes a C3-C4 disc bulge partially effacing the ventral thecal sac and possibly contacting the ventral spinal cord. Sinuses/Orbits: Visualized orbits demonstrate no acute abnormality. No significant paranasal sinus disease or mastoid effusion. IMPRESSION: Intermittently motion degraded examination. Several subcentimeter acute/early subacute infarcts within the left frontal and temporal lobes within the left MCA vascular territory. There is an additional small acute/early subacute infarct within the body of the corpus callosum. Correlate for an embolic phenomenon, possibly central or within the left carotid system. Background advanced chronic small vessel ischemic disease with multiple chronic lacunar infarcts as described. Moderate generalized parenchymal atrophy. Electronically Signed   By: Kellie Simmering DO   On: 10/19/2019 16:53   ECHOCARDIOGRAM COMPLETE  Result Date: 10/20/2019    ECHOCARDIOGRAM REPORT   Patient Name:   Regina Wolfe Date of Exam: 10/20/2019 Medical Rec #:  [id number]     Height:       64.0 in Accession #:    [id number]    Weight:       289.0 lb Date of Birth:  [birth year]    BSA:          2.29 m Patient Age:    79 years      BP:           158/83 mmHg Patient Gender: F  HR:           111 bpm. Exam Location:  Inpatient Procedure: 2D Echo, Color Doppler, Cardiac Doppler and Intracardiac            Opacification Agent Indications:    Stroke i163.9  History:        Patient has no prior history of Echocardiogram examinations.                 CAD; Risk Factors:Hypertension and Diabetes.  Sonographer:    Raquel Sarna Senior RDCS Referring Phys: [id number] [place] T TU  Sonographer Comments: Technically difficult study due to poor echo windows. IMPRESSIONS  1. Left ventricular ejection fraction, by estimation, is 50 to 55%. The left ventricle has low normal function. The left ventricle has no regional wall motion  abnormalities. Indeterminate diastolic filling due to E-A fusion.  2. Right ventricular systolic function is normal. The right ventricular size is normal. Tricuspid regurgitation signal is inadequate for assessing PA pressure.  3. The mitral valve is degenerative. No evidence of mitral valve regurgitation. No evidence of mitral stenosis.  4. The aortic valve is tricuspid. Aortic valve regurgitation is not visualized. No aortic stenosis is present.  5. The inferior vena cava is normal in size with greater than 50% respiratory variability, suggesting right atrial pressure of 3 mmHg. Comparison(s): No prior Echocardiogram. Conclusion(s)/Recomendation(s): No intracardiac source of embolism detected on this transthoracic study. A transesophageal echocardiogram is recommended to exclude cardiac source of embolism if clinically indicated. FINDINGS  Left Ventricle: Left ventricular ejection fraction, by estimation, is 50 to 55%. The left ventricle has low normal function. The left ventricle has no regional wall motion abnormalities. Definity contrast agent was given IV to delineate the left ventricular endocardial borders. The left ventricular internal cavity size was normal in size. There is no left ventricular hypertrophy. Indeterminate diastolic filling due to E-A fusion. Right Ventricle: The right ventricular size is normal. No increase in right ventricular wall thickness. Right ventricular systolic function is normal. Tricuspid regurgitation signal is inadequate for assessing PA pressure. Left Atrium: Left atrial size was normal in size. Right Atrium: Right atrial size was normal in size. Pericardium: A small pericardial effusion is present. The pericardial effusion is circumferential. Mitral Valve: The mitral valve is degenerative in appearance. Mild to moderate mitral annular calcification. No evidence of mitral valve regurgitation. No evidence of mitral valve stenosis. Tricuspid Valve: The tricuspid valve is grossly  normal. Tricuspid valve regurgitation is not demonstrated. Aortic Valve: The aortic valve is tricuspid. Aortic valve regurgitation is not visualized. No aortic stenosis is present. Pulmonic Valve: The pulmonic valve was grossly normal. Pulmonic valve regurgitation is not visualized. No evidence of pulmonic stenosis. Aorta: The aortic root and ascending aorta are structurally normal, with no evidence of dilitation. Venous: The inferior vena cava is normal in size with greater than 50% respiratory variability, suggesting right atrial pressure of 3 mmHg. IAS/Shunts: No atrial level shunt detected by color flow Doppler.  LEFT VENTRICLE PLAX 2D LVIDd:         4.80 cm LVIDs:         3.60 cm LV PW:         0.90 cm LV IVS:        0.90 cm LVOT diam:     2.00 cm LV SV:         33.30 ml LV SV Index:   21.12 LVOT Area:     3.14 cm  RIGHT VENTRICLE RV S prime:  12.90 cm/s TAPSE (M-mode): 2.1 cm LEFT ATRIUM             Index LA diam:        3.60 cm 1.57 cm/m LA Vol (A2C):   49.8 ml 21.77 ml/m LA Vol (A4C):   67.2 ml 29.38 ml/m LA Biplane Vol: 62.7 ml 27.41 ml/m  AORTIC VALVE LVOT Vmax:   62.70 cm/s LVOT Vmean:  46.200 cm/s LVOT VTI:    0.106 m  AORTA Ao Root diam: 2.90 cm Ao Asc diam:  3.20 cm  SHUNTS Systemic VTI:  0.11 m Systemic Diam: 2.00 cm Eleonore Chiquito MD Electronically signed by Eleonore Chiquito MD Signature Date/Time: 10/20/2019/3:39:46 PM    Final      Admission status: Inpatient: Based on patients clinical presentation and evaluation of above clinical data, I have made determination that patient meets Inpatient criteria at this time.   Oswald Hillock   [facility] If 7PM-7AM, please contact night-coverage at [email or web address], Office  [phone]  password [facility]  10/21/2019, 11:44 AM  LOS: 2 days

## 2019-10-21 NOTE — Significant Event (Signed)
Rapid Response Event Note  Overview: Neurologic - Altered Mental Status   Initial Focused Assessment: Called by nurse to assess for altered mental status. Per nurse, in report this morning, she was told that the patient has been A/O x 4, but currently the patient is hard to arouse and once awake, she quickly falls back asleep. Upon arrival, patient woke to noxious stimuli, would follow some commands and then would fall asleep. She was able to move all extremities to commands but she was not willing to participate consistently. Not in acute distress, able to track my finger in all directions - though it required prompting. HR 120 (ongoing), SBP 160s, blood sugar 134. About 15-20 minutes ago patient ate breakfast and even took some pills by mouth. Did received Percocet last night for chronic back. She does not appear to have acute focal neuro deficits, but seems fatigued overall. [facility] MD was the bedside as well  Interventions: -- No RRT Interventions   Plan of Care: -- Avoid narcotics/sedatives if possible - treat pain using other interventions if possible -- Monitor neurologic status as ordered  -- Delirium Precautions  -- Encourage OOB   Event Summary:  Call Time 0844 Arrival Time 0848 End Time 0900  Jahmir Salo, [place]

## 2019-10-21 NOTE — Evaluation (Signed)
Physical Therapy Evaluation Patient Details Name: Regina Wolfe MRN: [id number] DOB: [birth year] Today's Date: 10/21/2019   History of Present Illness  64 yo female recurrent falls and disorientation. pt found to have several acute/ early subacute infarct on the L frontal and temporal lobe within L MCA, tachycardia,HTN and mild anemia. 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 PT/OT eval limited due to tachycardia, 137 bpm in supine, up to 160 bpm in sitting. Pt required mod A for coming to EOB, +2 mod A for repositioning in bed once returned to supine. Extremity weakness continues to be L side weaker than R from previous CVA in 2018. Word finding difficulties and processing speed seem to be worsened with this CVA.  Pt currently with functional limitations due to the deficits listed below (see PT Problem List). Pt will benefit from skilled PT to increase their independence and safety with mobility to allow discharge to the venue listed below.      Follow Up Recommendations CIR    Equipment Recommendations  None recommended by PT    Recommendations for Other Services Rehab consult     Precautions / Restrictions Precautions Precautions: Fall Precaution Comments: watch HR Restrictions Weight Bearing Restrictions: No      Mobility  Bed Mobility Overal bed mobility: Needs Assistance Bed Mobility: Supine to Sit;Sit to Supine     Supine to sit: +2 for physical assistance;Mod assist Sit to supine: +2 for physical assistance;Mod assist   General bed mobility comments: pt requires (A) with pad to bring hips to EOB and hand over hand to reach bed rail. pt with pain with return to supine. pt requires (A) to control trunk to surface  Transfers                 General transfer comment: unable to (A)  Ambulation/Gait             General Gait Details: deferred as  transfers  Stairs            Wheelchair Mobility    Modified Rankin (Stroke Patients Only) Modified Rankin (Stroke Patients Only) Pre-Morbid Rankin Score: Moderately severe disability Modified Rankin: Moderately severe disability     Balance Overall balance assessment: Needs assistance Sitting-balance support: Bilateral upper extremity supported Sitting balance-Leahy Scale: Poor Sitting balance - Comments: reliant on L HHA for stability in sitting, posterior lean with near LOB when moving LE's in sitting Postural control: Posterior lean                                   Pertinent Vitals/Pain Pain Assessment: Faces Faces Pain Scale: Hurts little more Pain Location: LE's below knee to touch Pain Descriptors / Indicators: Tender Pain Intervention(s): Monitored during session;Repositioned    Home Living Family/patient expects to be discharged to:: Private residence Living Arrangements: Other relatives Available Help at Discharge: Family;Available 24 hours/day Type of Home: House         Home Equipment: Walker - 2 wheels Additional Comments: per pt's sister that lives in [place], pt lives with her other sister and nephew    Prior Function Level of Independence: Needs assistance   Gait / Transfers Assistance Needed: ambulated with RW, L side weak since first CVA in 2018  ADL's / Homemaking Assistance Needed: family assisted with ADL's  Comments: pt has had  some word finding and cognitive issues since first CVA     Hand Dominance   Dominant Hand: Right    Extremity/Trunk Assessment   Upper Extremity Assessment Upper Extremity Assessment: Generalized weakness;RUE deficits/detail;LUE deficits/detail LUE Deficits / Details: noted to have slight decrease strength to the R UE but very close in functional use    Lower Extremity Assessment Lower Extremity Assessment: Defer to PT evaluation RLE Deficits / Details: hip flex 2/5, knee ext 3/5, knee flex  3/5 RLE Sensation: decreased light touch LLE Deficits / Details: weaker than R (from previous CVA), hip flex 2-/5, knee ext 3/5, knee flex 3/5    Cervical / Trunk Assessment Cervical / Trunk Assessment: Kyphotic(body habitus rounded shoulders)  Communication   Communication: Expressive difficulties;Receptive difficulties  Cognition Arousal/Alertness: Lethargic Behavior During Therapy: Flat affect Overall Cognitive Status: Impaired/Different from baseline Area of Impairment: Memory;Following commands;Safety/judgement;Awareness;Problem solving;Attention;Orientation                 Orientation Level: Disoriented to;Situation Current Attention Level: Focused Memory: Decreased short-term memory Following Commands: Follows one step commands with increased time Safety/Judgement: Decreased awareness of deficits Awareness: Intellectual Problem Solving: Slow processing;Decreased initiation;Requires verbal cues;Difficulty sequencing;Requires tactile cues General Comments: pt demosntrates perseveration on statements and repeats them for the next set of questions. Pt when asked to name objects names first object correctly then continues to say that same object name      General Comments General comments (skin integrity, edema, etc.): HR 160 max during session with static sitting. sister at bed side     Exercises     Assessment/Plan    PT Assessment Patient needs continued PT services  PT Problem List Decreased strength;Decreased activity tolerance;Decreased balance;Decreased mobility;Decreased range of motion;Decreased coordination;Decreased cognition;Decreased knowledge of use of DME;Decreased safety awareness;Decreased knowledge of precautions;Obesity;Cardiopulmonary status limiting activity;Impaired tone;Impaired sensation       PT Treatment Interventions DME instruction;Gait training;Functional mobility training;Therapeutic activities;Therapeutic exercise;Balance  training;Neuromuscular re-education;Cognitive remediation;Patient/family education    PT Goals (Current goals can be found in the Care Plan section)  Acute Rehab PT Goals Patient Stated Goal: to be able to return home PT Goal Formulation: With patient/family Time For Goal Achievement: 11/04/19 Potential to Achieve Goals: Fair    Frequency Min 4X/week   Barriers to discharge        Co-evaluation PT/OT/SLP Co-Evaluation/Treatment: Yes Reason for Co-Treatment: To address functional/ADL transfers;For patient/therapist safety PT goals addressed during session: Mobility/safety with mobility OT goals addressed during session: ADL's and self-care;Proper use of Adaptive equipment and DME;Strengthening/ROM       AM-PAC PT "6 Clicks" Mobility  Outcome Measure Help needed turning from your back to your side while in a flat bed without using bedrails?: A Lot Help needed moving from lying on your back to sitting on the side of a flat bed without using bedrails?: A Lot Help needed moving to and from a bed to a chair (including a wheelchair)?: Total Help needed standing up from a chair using your arms (e.g., wheelchair or bedside chair)?: Total Help needed to walk in hospital room?: Total Help needed climbing 3-5 steps with a railing? : Total 6 Click Score: 8    End of Session   Activity Tolerance: Treatment limited secondary to medical complications (Comment)(tachy) Patient left: in bed;with call bell/phone within reach;with bed alarm set;with family/visitor present Nurse Communication: Mobility status PT Visit Diagnosis: Muscle weakness (generalized) (M62.81);Other symptoms and signs involving the nervous system (R29.898);Difficulty in walking, not elsewhere classified (R26.2)    Time:  [id number] PT Time Calculation (min) (ACUTE ONLY): 33 min   Charges:   PT Evaluation $PT Eval Moderate Complexity: [place]  Pager  [phone] Office [place] 10/21/2019, 1:44 PM

## 2019-10-21 NOTE — Progress Notes (Signed)
Pt alert on initial assessment this am. Participating in conversation appropriately. Pt started eating breakfast and stated she was having back pain. This RN gave patient tylenol as ordered. When beginning assessment patient noted to be drifting off to sleep and not answering questions. CBG 134, tachycardic (baseline). Required repeated stimulation to answer simple questions such as her name or birthday. [place] score significantly elevated compared to prior assessment by previous RN. Rapid response RN called to assess at bedside. MD [place] present at bedside notified of neuro assessment findings. Pt currently resting, sister at bedside. No acute distress.

## 2019-10-21 NOTE — Evaluation (Addendum)
Speech Language Pathology Evaluation Patient Details Name: Regina Wolfe MRN: [id number] DOB: Mar 01, 1956 Today's Date: 10/21/2019 Time: [id number] SLP Time Calculation (min) (ACUTE ONLY): 21 min  Problem List:  Patient Active Problem List   Diagnosis Date Noted  . CVA (cerebral vascular accident) ([place]) 10/19/2019  . Tachycardia 10/19/2019  . Essential hypertension 10/19/2019  . Anemia 10/19/2019  . Insulin dependent type 2 diabetes mellitus ([place]) 10/19/2019  . Peripheral neuropathy 10/19/2019  . CAD (coronary artery disease) 10/19/2019   Past Medical History:  Past Medical History:  Diagnosis Date  . Diabetes mellitus without complication ([place])   . Stroke [facility])    Past Surgical History:  Past Surgical History:  Procedure Laterality Date  . ABDOMINAL HYSTERECTOMY     Total  . CESAREAN SECTION  1978  . Right and Left Eye Surgery for Detached Retinas     HPI:  64 y.o.femalewith medical history significant forCAD, CVA in 2018on Plavix, insulin-dependent type 2 diabetes with neuropathy, venous insufficiency, hyperlipidemia, and left eye blindness secondary to retinal detachment who presents with concerns of recurrent falls and disorientation. MRI brain (10/19/19) showed several acute/early subacture infarcts within the left frontal and temporal lobes within the left MCA vascular territory. Patient's sister, Clarene Critchley, reports pt lives in [place] with another sister and nephew.    Assessment / Plan / Recommendation Clinical Impression   Patient received at bedside for speech/language evaluation. Patient lethargic, with eyes closed for majority of evaluation. Evaluation impacted by patient's level of fatigue. However, patient does present with moderate receptive language deficits and expressive language deficits characterized by anomia, phonemic paraphasias. Patient able to follow one step verbal commands with 70% accuracy, though this was impacted by fatigue. Patient able to answer  basic biographical questions with 80% accuracy and basic immediate environment questions with 50% accuracy. Patient oriented to self and place ("hospital"). In confrontation naming task (objects) patient demonstrated phonemic paraphasias.   Sister, Clarene Critchley, present at bedside and able to provide pertinent information. Sister states patient lives with another sister and nephew in [place], does not drive and receives some help with IADLs from family (medication management, groceries, finances) since her 2018 CVA. Sister reports she feels patient has had an acute decline in her speech since this most recent stroke. When asked to elucidate, sister reports "she tries to talk, but it's gibberish." Sister reports patient has been consistently "very sleepy."  Patient will benefit from skilled ST while in-house for continued diagnostic treatment and to address above-named deficits. Patient will benefit from skilled ST at the next venue of care as well.     SLP Assessment  SLP Recommendation/Assessment: Patient needs continued Speech Lanaguage Pathology Services SLP Visit Diagnosis: Aphasia (R47.01)    Follow Up Recommendations  Other (comment)(TBD) will assess as patient with improved alertness   Frequency and Duration min 2x/week  2 weeks      SLP Evaluation Cognition  Overall Cognitive Status: Impaired/Different from baseline Arousal/Alertness: Lethargic Orientation Level: Oriented to person;Oriented to place       Comprehension  Auditory Comprehension Overall Auditory Comprehension: Impaired Yes/No Questions: Impaired Basic Biographical Questions: 76-100% accurate Basic Immediate Environment Questions: 50-74% accurate Complex Questions: Not tested Commands: Impaired One Step Basic Commands: 50-74% accurate    Expression Expression Primary Mode of Expression: Verbal Verbal Expression Overall Verbal Expression: Impaired Initiation: Impaired Level of Generative/Spontaneous  Verbalization: Word Written Expression Dominant Hand: Right Written Expression: Not tested   Oral / Motor  Oral Motor/Sensory Function Overall Oral Motor/Sensory Function: Within functional limits  [place] 10/21/2019, 9:57 AM  Marina Goodell, M.Ed., [facility] Acute Rehabilitation [phone]: Acute Rehab office [phone] - pager

## 2019-10-21 NOTE — Progress Notes (Signed)
Rehab Admissions Coordinator Note:  Per PT/OT recommendation, patient was screened by Michel Santee for appropriateness for an Inpatient Acute Rehab Consult.  At this time, we are recommending Inpatient Rehab consult.  I will place a consult order per protocol.   Michel Santee 10/21/2019, 1:52 PM  I can be reached at [phone].

## 2019-10-21 NOTE — Evaluation (Signed)
Occupational Therapy Evaluation Patient Details Name: Regina Wolfe MRN: [id number] DOB: 22-Sep-1955 Today's Date: 10/21/2019    History of Present Illness 64 yo female recurrent falls and disorientation. pt found to have several acute/ early subacute infarct on the L frontal and temporal lobe within L MCA, tachycardia,HTN and mild anemia. 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 Session elevation focused on bed level assessment. Recommend total +2 to attempt basic transfer. Recommend follow up CIR to progress to home with sister and nephew.     Follow Up Recommendations  CIR    Equipment Recommendations  3 in 1 bedside commode    Recommendations for Other Services Rehab consult     Precautions / Restrictions Precautions Precautions: Fall Precaution Comments: watch HR Restrictions Weight Bearing Restrictions: No      Mobility Bed Mobility Overal bed mobility: Needs Assistance Bed Mobility: Supine to Sit;Sit to Supine     Supine to sit: +2 for physical assistance;Mod assist Sit to supine: +2 for physical assistance;Mod assist   General bed mobility comments: pt requires (A) with pad to bring hips to EOB and hand over hand to reach bed rail. pt with pain with return to supine. pt requires (A) to control trunk to surface  Transfers                 General transfer comment: unable to (A)    Balance                                           ADL either performed or assessed with clinical judgement   ADL Overall ADL's : Needs assistance/impaired Eating/Feeding: Set up;Sitting   Grooming: Wash/dry face;Set up;Bed level   Upper Body Bathing: Moderate assistance   Lower Body Bathing: Total assistance   Upper Body Dressing : Moderate assistance   Lower Body Dressing:  Total assistance                 General ADL Comments: pt with HR 137 start session and increased to HR160 with EOB sitting. Unable to progress to basic transfer attempts at this time     Vision Baseline Vision/History: Legally blind Additional Comments: residual L eye blindness; decrease R peripheral vision     Perception     Praxis      Pertinent Vitals/Pain Pain Assessment: Faces Faces Pain Scale: Hurts little more Pain Location: LE's below knee to touch Pain Descriptors / Indicators: Tender Pain Intervention(s): Monitored during session;Repositioned     Hand Dominance Right   Extremity/Trunk Assessment Upper Extremity Assessment Upper Extremity Assessment: Generalized weakness;RUE deficits/detail;LUE deficits/detail LUE Deficits / Details: noted to have slight decrease strength to the R UE but very close in functional use   Lower Extremity Assessment Lower Extremity Assessment: Defer to PT evaluation   Cervical / Trunk Assessment Cervical / Trunk Assessment: Kyphotic(body habitus rounded shoulders)   Communication Communication Communication: Expressive difficulties;Receptive difficulties   Cognition Arousal/Alertness: Lethargic Behavior During Therapy: Flat affect Overall Cognitive Status: Impaired/Different from baseline                                 General Comments:  pt demosntrates perseveration on statements and repeats them for the next set of questions. Pt when asked to name objects names first object correctly then continues to say that same object name   General Comments  HR 160 max during session with static sitting. sister at bed side     Exercises     Shoulder Instructions      Home Living Family/patient expects to be discharged to:: Private residence Living Arrangements: Other relatives Available Help at Discharge: Family;Available 24 hours/day Type of Home: House                       Home Equipment: Walker -  2 wheels   Additional Comments: per pt's sister that lives in [place], pt lives with her other sister and nephew      Prior Functioning/Environment Level of Independence: Needs assistance  Gait / Transfers Assistance Needed: ambulated with RW, L side weak since first CVA in 2018 ADL's / Homemaking Assistance Needed: family assisted with ADL's   Comments: pt has had some word finding and cognitive issues since first CVA        OT Problem List: Decreased strength;Decreased activity tolerance;Impaired balance (sitting and/or standing);Decreased safety awareness;Decreased knowledge of use of DME or AE;Decreased knowledge of precautions;Obesity;Cardiopulmonary status limiting activity      OT Treatment/Interventions: Self-care/ADL training;Therapeutic exercise;Neuromuscular education;Energy conservation;DME and/or AE instruction;Manual therapy;Modalities;Therapeutic activities;Cognitive remediation/compensation;Visual/perceptual remediation/compensation;Patient/family education;Balance training    OT Goals(Current goals can be found in the care plan section) Acute Rehab OT Goals Patient Stated Goal: to be able to return home OT Goal Formulation: With patient Time For Goal Achievement: 11/04/19 Potential to Achieve Goals: Good  OT Frequency: Min 2X/week   Barriers to D/C:            Co-evaluation PT/OT/SLP Co-Evaluation/Treatment: Yes Reason for Co-Treatment: To address functional/ADL transfers;For patient/therapist safety   OT goals addressed during session: ADL's and self-care;Proper use of Adaptive equipment and DME;Strengthening/ROM      AM-PAC OT "6 Clicks" Daily Activity     Outcome Measure Help from another person eating meals?: A Little Help from another person taking care of personal grooming?: A Little Help from another person toileting, which includes using toliet, bedpan, or urinal?: A Lot Help from another person bathing (including washing, rinsing, drying)?: A  Lot Help from another person to put on and taking off regular upper body clothing?: A Lot Help from another person to put on and taking off regular lower body clothing?: Total 6 Click Score: 13   End of Session Nurse Communication: Mobility status;Precautions  Activity Tolerance: Patient tolerated treatment well Patient left: in bed;with call bell/phone within reach;with bed alarm set  OT Visit Diagnosis: Unsteadiness on feet (R26.81);Muscle weakness (generalized) (M62.81)                Time: [id number] OT Time Calculation (min): 52 min Charges:  OT General Charges $OT Visit: 1 Visit OT Evaluation $OT Eval Moderate Complexity: 1 Mod   [place], OTR/L  Acute Rehabilitation Services Pager: [phone] Office: [phone] .   Jeri Modena 10/21/2019, 1:36 PM

## 2019-10-22 ENCOUNTER — Encounter (HOSPITAL_COMMUNITY): Payer: Self-pay | Admitting: Family Medicine

## 2019-10-22 ENCOUNTER — Inpatient Hospital Stay (HOSPITAL_COMMUNITY): Payer: Medicare HMO | Admitting: Certified Registered"

## 2019-10-22 ENCOUNTER — Ambulatory Visit (HOSPITAL_COMMUNITY): Admit: 2019-10-22 | Payer: Medicare HMO | Admitting: Cardiology

## 2019-10-22 ENCOUNTER — Inpatient Hospital Stay (HOSPITAL_COMMUNITY): Payer: Medicare HMO

## 2019-10-22 ENCOUNTER — Encounter (HOSPITAL_COMMUNITY)
Admission: EM | Disposition: A | Discharge status: Rehabilitation Facility | Payer: Self-pay | Source: Home / Self Care | Attending: Internal Medicine

## 2019-10-22 DIAGNOSIS — I6389 Other cerebral infarction: Secondary | ICD-10-CM

## 2019-10-22 DIAGNOSIS — I10 Essential (primary) hypertension: Secondary | ICD-10-CM

## 2019-10-22 DIAGNOSIS — R509 Fever, unspecified: Secondary | ICD-10-CM

## 2019-10-22 DIAGNOSIS — I6300 Cerebral infarction due to thrombosis of unspecified precerebral artery: Secondary | ICD-10-CM

## 2019-10-22 DIAGNOSIS — Z8673 Personal history of transient ischemic attack (TIA), and cerebral infarction without residual deficits: Secondary | ICD-10-CM

## 2019-10-22 DIAGNOSIS — D649 Anemia, unspecified: Secondary | ICD-10-CM

## 2019-10-22 DIAGNOSIS — R739 Hyperglycemia, unspecified: Secondary | ICD-10-CM

## 2019-10-22 DIAGNOSIS — E1142 Type 2 diabetes mellitus with diabetic polyneuropathy: Secondary | ICD-10-CM

## 2019-10-22 HISTORY — PX: TEE WITHOUT CARDIOVERSION: SHX5443

## 2019-10-22 HISTORY — PX: LOOP RECORDER INSERTION: EP1214

## 2019-10-22 HISTORY — PX: BUBBLE STUDY: SHX6837

## 2019-10-22 LAB — GLUCOSE, CAPILLARY
Glucose-Capillary: 131 mg/dL — ABNORMAL HIGH (ref 70–99)
Glucose-Capillary: 102 mg/dL — ABNORMAL HIGH (ref 70–99)
Glucose-Capillary: 86 mg/dL (ref 70–99)
Glucose-Capillary: 160 mg/dL — ABNORMAL HIGH (ref 70–99)

## 2019-10-22 LAB — BASIC METABOLIC PANEL
Anion gap: 8 (ref 5–15)
BUN: 8 mg/dL (ref 8–23)
CO2: 25 mmol/L (ref 22–32)
Calcium: 8.4 mg/dL — ABNORMAL LOW (ref 8.9–10.3)
Chloride: 108 mmol/L (ref 98–111)
Creatinine, Ser: 0.62 mg/dL (ref 0.44–1.00)
GFR calc Af Amer: >60 mL/min (ref >60)
GFR calc non Af Amer: >60 mL/min (ref >60)
Glucose, Bld: 161 mg/dL — ABNORMAL HIGH (ref 70–99)
Potassium: 3.7 mmol/L (ref 3.5–5.1)
Sodium: 141 mmol/L (ref 135–145)

## 2019-10-22 LAB — CBC
HCT: 34.2 % — ABNORMAL LOW (ref 36.0–46.0)
Hemoglobin: 10.9 g/dL — ABNORMAL LOW (ref 12.0–15.0)
MCH: 25.4 pg — ABNORMAL LOW (ref 26.0–34.0)
MCHC: 31.9 g/dL (ref 30.0–36.0)
MCV: 79.7 fL — ABNORMAL LOW (ref 80.0–100.0)
Platelets: 333 10*3/uL (ref 150–400)
RBC: 4.29 MIL/uL (ref 3.87–5.11)
RDW: 15.1 % (ref 11.5–15.5)
WBC: 6.2 10*3/uL (ref 4.0–10.5)
nRBC: 0 % (ref 0.0–0.2)

## 2019-10-22 SURGERY — ECHOCARDIOGRAM, TRANSESOPHAGEAL
Anesthesia: Monitor Anesthesia Care

## 2019-10-22 SURGERY — LOOP RECORDER INSERTION

## 2019-10-22 MED ORDER — LIDOCAINE 2% (20 MG/ML) 5 ML SYRINGE
INTRAMUSCULAR | Status: DC | PRN
  Administered 2019-10-22: 30 mg via INTRAVENOUS

## 2019-10-22 MED ORDER — PHENYLEPHRINE 40 MCG/ML (10ML) SYRINGE FOR IV PUSH (FOR BLOOD PRESSURE SUPPORT)
PREFILLED_SYRINGE | INTRAVENOUS | Status: DC | PRN
  Administered 2019-10-22: 80 ug via INTRAVENOUS

## 2019-10-22 MED ORDER — LIDOCAINE-EPINEPHRINE 1 %-1:100000 IJ SOLN
INTRAMUSCULAR | Status: DC | PRN
  Administered 2019-10-22: 20 mL

## 2019-10-22 MED ORDER — LIDOCAINE-EPINEPHRINE 1 %-1:100000 IJ SOLN
INTRAMUSCULAR | Status: AC
  Filled 2019-10-22: qty 1

## 2019-10-22 MED ORDER — PROPOFOL 10 MG/ML IV BOLUS
INTRAVENOUS | Status: DC | PRN
  Administered 2019-10-22: 20 mg via INTRAVENOUS

## 2019-10-22 MED ORDER — PROPOFOL 500 MG/50ML IV EMUL
INTRAVENOUS | Status: DC | PRN
  Administered 2019-10-22: 100 ug/kg/min via INTRAVENOUS

## 2019-10-22 MED ORDER — SODIUM CHLORIDE 0.9 % IV SOLN: INTRAVENOUS | Status: DC

## 2019-10-22 SURGICAL SUPPLY — 2 items
MONITOR REVEAL LINQ II (Prosthesis & Implant Heart) ×3 IMPLANT
PACK LOOP INSERTION (CUSTOM PROCEDURE TRAY) ×3 IMPLANT

## 2019-10-22 NOTE — Consult Note (Signed)
Physical Medicine and Rehabilitation Consult   Reason for Consult: Stroke with functional deficits.  Referring Physician: Dr. Darrick Meigs  HPI: Regina Wolfe is a 64 y.o. 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  History taken from chart review and patient.  Processing.  She was evaluated in ED out of town and diagnosed with TIA but continued to have issues with disorientation and family presented to [facility] ED on 10/19/2019.  MRI brain showing multiple left frontal and temporal lobe infarcts within left MCA and additional small acute/subacute infract within body of corpus callosum.  Echocardiogram showed ejection fraction of 50-55%.  TEE ordered (to be done 2/18)  to rule out PFO due to embolic stroke of unknown origin --- on DAPT. Therapy evaluations completed and patient with lethargy, difficulty following one step commands and has deficits with ADLs and mobility. CIR recommended due to functional decline.   Review of Systems  Constitutional: Negative for chills and fever.  HENT: Negative for hearing loss.   Eyes: Positive for blurred vision (left eye blind and decreased vision in right eye).  Respiratory: Positive for shortness of breath. Negative for cough.   Cardiovascular: Negative for chest pain.  Gastrointestinal: Positive for heartburn.  Musculoskeletal: Positive for back pain and joint pain.  Skin: Negative for rash.  Neurological: Positive for focal weakness and headaches. Negative for sensory change.  Psychiatric/Behavioral: Positive for memory loss. The patient is nervous/anxious.      Past Medical History:  Diagnosis Date  . Diabetes mellitus without complication ([place])   . Stroke [facility])     Past Surgical History:  Procedure Laterality Date  . ABDOMINAL HYSTERECTOMY     Total  . CESAREAN SECTION  1978  . Right and  Left Eye Surgery for Detached Retinas      Family History  Problem Relation Age of Onset  . Diabetes Mother   . Heart attack Mother   . Cancer Sister        ovarian and cervical   . Leukemia Sister     Social History:  Lives with family. Nephew manages home and drives patient. She reports that she has quit smoking. Her smoking use included cigarettes. She has never used smokeless tobacco. She reports that she does not drink alcohol or use drugs.   Allergies  Allergen Reactions  . Latex Other (See Comments)    "Hands turned black with extreme swelling"  . Morphine And Related Hives   Medications Prior to Admission  Medication Sig Dispense Refill  . amitriptyline (ELAVIL) 50 MG tablet Take 25 mg by mouth at bedtime.    Marland Kitchen atorvastatin (LIPITOR) 40 MG tablet Take 40 mg by mouth at bedtime.    . carvedilol (COREG) 25 MG tablet Take 25 mg by mouth 2 (two) times daily.    . clopidogrel (PLAVIX) 75 MG tablet Take 75 mg by mouth daily.    . DULoxetine (CYMBALTA) 60 MG capsule Take 60 mg by mouth daily.    Marland Kitchen gabapentin (NEURONTIN) 600 MG tablet Take 600 mg by mouth at bedtime.    Marland Kitchen glyBURIDE-metformin (GLUCOVANCE) 5-500 MG tablet Take 2 tablets by mouth 2 (two) times daily.    . insulin detemir (LEVEMIR) 100 UNIT/ML injection Inject 15 Units into the skin every evening.    . insulin lispro (HUMALOG) 100 UNIT/ML injection Inject 15-30 Units into the  skin 3 (three) times daily with meals. Inject 15 units if blood sugar is below 350mg /dL; Inject 30 units if blood sugar is above 350mg /dL    . losartan (COZAAR) 100 MG tablet Take 100 mg by mouth daily.    Marland Kitchen oxyCODONE-acetaminophen (PERCOCET) 10-325 MG tablet Take 1 tablet by mouth every 6 (six) hours as needed for pain.     . vitamin B-12 (CYANOCOBALAMIN) 100 MCG tablet Take 100 mcg by mouth daily.    . Vitamin D, Ergocalciferol, (DRISDOL) 1.25 MG (50000 UNIT) CAPS capsule Take 50,000 Units by mouth once a week.      Home: Home Living  Family/patient expects to be discharged to:: Private residence Living Arrangements: Other relatives Available Help at Discharge: Family, Available 24 hours/day Type of Home: House Home Equipment: Walker - 2 wheels Additional Comments: per pt's sister that lives in [place], pt lives with her other sister and nephew  Lives With: Other (Comment)(sister and nephew)  Functional History: Prior Function Level of Independence: Needs assistance Gait / Transfers Assistance Needed: ambulated with RW, L side weak since first CVA in 2018 ADL's / Homemaking Assistance Needed: family assisted with ADL's Comments: pt has had some word finding and cognitive issues since first CVA Functional Status:  Mobility: Bed Mobility Overal bed mobility: Needs Assistance Bed Mobility: Supine to Sit, Sit to Supine Supine to sit: +2 for physical assistance, Mod assist Sit to supine: +2 for physical assistance, Mod assist General bed mobility comments: pt requires (A) with pad to bring hips to EOB and hand over hand to reach bed rail. pt with pain with return to supine. pt requires (A) to control trunk to surface Transfers General transfer comment: unable to (A) Ambulation/Gait General Gait Details: deferred as transfers    ADL: ADL Overall ADL's : Needs assistance/impaired Eating/Feeding: Set up, Sitting Grooming: Wash/dry face, Set up, Bed level Upper Body Bathing: Moderate assistance Lower Body Bathing: Total assistance Upper Body Dressing : Moderate assistance Lower Body Dressing: Total assistance General ADL Comments: pt with HR 137 start session and increased to HR160 with EOB sitting. Unable to progress to basic transfer attempts at this time  Cognition: Cognition Overall Cognitive Status: Impaired/Different from baseline Arousal/Alertness: Lethargic Orientation Level: Oriented to person, Oriented to place, Oriented to time, Oriented to situation Cognition Arousal/Alertness: Lethargic Behavior  During Therapy: Flat affect Overall Cognitive Status: Impaired/Different from baseline Area of Impairment: Memory, Following commands, Safety/judgement, Awareness, Problem solving, Attention, Orientation Orientation Level: Disoriented to, Situation Current Attention Level: Focused Memory: Decreased short-term memory Following Commands: Follows one step commands with increased time Safety/Judgement: Decreased awareness of deficits Awareness: Intellectual Problem Solving: Slow processing, Decreased initiation, Requires verbal cues, Difficulty sequencing, Requires tactile cues General Comments: pt demosntrates perseveration on statements and repeats them for the next set of questions. Pt when asked to name objects names first object correctly then continues to say that same object name   Blood pressure (!) 150/79, pulse 100, temperature 98.6 F (37 C), temperature source Oral, resp. rate 16, height 5\' 4"  (1.626 m), weight 128.3 kg, SpO2 97 %. Physical Exam  Nursing note and vitals reviewed. Constitutional: She appears well-developed and well-nourished.  HENT:  Head: Normocephalic and atraumatic.  Eyes: EOM are normal. Right eye exhibits no discharge. Left eye exhibits no discharge.  Neck: No tracheal deviation present. No thyromegaly present.  Respiratory: Effort normal. No respiratory distress.  GI: She exhibits no distension.  Musculoskeletal:     Comments: No edema or tenderness in extremities  Neurological:  She is alert.  Morbidly obese Some difficulty following simple motor commands.  Motor: Bilateral upper extremities: 5/5 proximal distal  Left lower extremity: 5/5 proximal distally  Right lower extremity: 4/5, knee extension 4/5, ankle dorsiflexion 5/5  Skin: Skin is warm and dry.  Psychiatric: Her speech is normal. Her affect is blunt.    Results for orders placed or performed during the hospital encounter of 10/19/19 (from the past 24 hour(s))  Glucose, capillary      Status: Abnormal   Collection Time: 10/21/19 11:56 AM  Result Value Ref Range   Glucose-Capillary 172 (H) 70 - 99 mg/dL  Culture, blood (Routine X 2) w Reflex to ID Panel     Status: None (Preliminary result)   Collection Time: 10/21/19  1:29 PM   Specimen: BLOOD RIGHT HAND  Result Value Ref Range   Specimen Description BLOOD RIGHT HAND    Special Requests      BOTTLES DRAWN AEROBIC ONLY Blood Culture adequate volume   Culture      NO GROWTH < 24 HOURS Performed at [facility], [place] [address] [place], [place] [address]    Report Status PENDING   Culture, blood (Routine X 2) w Reflex to ID Panel     Status: None (Preliminary result)   Collection Time: 10/21/19  1:29 PM   Specimen: BLOOD LEFT HAND  Result Value Ref Range   Specimen Description BLOOD LEFT HAND    Special Requests      BOTTLES DRAWN AEROBIC ONLY Blood Culture adequate volume   Culture      NO GROWTH < 24 HOURS Performed at [facility], [place] [address] [place], [place] [address]    Report Status PENDING   Glucose, capillary     Status: Abnormal   Collection Time: 10/21/19  4:55 PM  Result Value Ref Range   Glucose-Capillary 240 (H) 70 - 99 mg/dL  Urinalysis, Routine w reflex microscopic     Status: Abnormal   Collection Time: 10/21/19  7:33 PM  Result Value Ref Range   Color, Urine YELLOW YELLOW   APPearance CLEAR CLEAR   Specific Gravity, Urine 1.014 1.005 - 1.030   pH 6.0 5.0 - 8.0   Glucose, UA >=500 (A) NEGATIVE mg/dL   Hgb urine dipstick NEGATIVE NEGATIVE   Bilirubin Urine NEGATIVE NEGATIVE   Ketones, ur NEGATIVE NEGATIVE mg/dL   Protein, ur NEGATIVE NEGATIVE mg/dL   Nitrite POSITIVE (A) NEGATIVE   Leukocytes,Ua SMALL (A) NEGATIVE   RBC / HPF 0-5 0 - 5 RBC/hpf   WBC, UA 0-5 0 - 5 WBC/hpf   Bacteria, UA NONE SEEN NONE SEEN   Squamous Epithelial / LPF 0-5 0 - 5  Glucose, capillary     Status: Abnormal   Collection Time: 10/21/19  9:08 PM  Result Value Ref Range   Glucose-Capillary 214  (H) 70 - 99 mg/dL  Basic metabolic panel     Status: Abnormal   Collection Time: 10/22/19  2:05 AM  Result Value Ref Range   Sodium 141 135 - 145 mmol/L   Potassium 3.7 3.5 - 5.1 mmol/L   Chloride 108 98 - 111 mmol/L   CO2 25 22 - 32 mmol/L   Glucose, Bld 161 (H) 70 - 99 mg/dL   BUN 8 8 - 23 mg/dL   Creatinine, Ser 0.62 0.44 - 1.00 mg/dL   Calcium 8.4 (L) 8.9 - 10.3 mg/dL   GFR calc non Af Amer >60 >60 mL/min  GFR calc Af Amer >60 >60 mL/min   Anion gap 8 5 - 15  CBC     Status: Abnormal   Collection Time: 10/22/19  2:05 AM  Result Value Ref Range   WBC 6.2 4.0 - 10.5 K/uL   RBC 4.29 3.87 - 5.11 MIL/uL   Hemoglobin 10.9 (L) 12.0 - 15.0 g/dL   HCT 34.2 (L) 36.0 - 46.0 %   MCV 79.7 (L) 80.0 - 100.0 fL   MCH 25.4 (L) 26.0 - 34.0 pg   MCHC 31.9 30.0 - 36.0 g/dL   RDW 15.1 11.5 - 15.5 %   Platelets 333 150 - 400 K/uL   nRBC 0.0 0.0 - 0.2 %  Glucose, capillary     Status: Abnormal   Collection Time: 10/22/19  6:08 AM  Result Value Ref Range   Glucose-Capillary 131 (H) 70 - 99 mg/dL  Glucose, capillary     Status: Abnormal   Collection Time: 10/22/19  8:42 AM  Result Value Ref Range   Glucose-Capillary 102 (H) 70 - 99 mg/dL   DG Chest Port 1V same Day  Result Date: 10/21/2019 CLINICAL DATA:  Fever. EXAM: PORTABLE CHEST 1 VIEW COMPARISON:  No recent. FINDINGS: Heart size stable. Low lung volumes. Mild bibasilar atelectasis. Mild infiltrates can not be excluded. No pleural effusion or pneumothorax. Old left sixth rib fracture. No acute bony abnormality. Degenerative change thoracic spine. IMPRESSION: Low lung volumes with mild bibasilar atelectasis. Mild basilar infiltrates cannot be excluded. Electronically Signed   By: Marcello Moores  Register   On: 10/21/2019 13:47   ECHOCARDIOGRAM COMPLETE  Result Date: 10/20/2019    ECHOCARDIOGRAM REPORT   Patient Name:   KENIDEE SHIRRELL Date of Exam: 10/20/2019 Medical Rec #:  [id number]     Height:       64.0 in Accession #:    [id number]    Weight:        289.0 lb Date of Birth:  [birth year]    BSA:          2.29 m Patient Age:    16 years      BP:           158/83 mmHg Patient Gender: F             HR:           111 bpm. Exam Location:  Inpatient Procedure: 2D Echo, Color Doppler, Cardiac Doppler and Intracardiac            Opacification Agent Indications:    Stroke i163.9  History:        Patient has no prior history of Echocardiogram examinations.                 CAD; Risk Factors:Hypertension and Diabetes.  Sonographer:    Raquel Sarna Senior RDCS Referring Phys: [id number] [place] T [facility]  Sonographer Comments: Technically difficult study due to poor echo windows. IMPRESSIONS  1. Left ventricular ejection fraction, by estimation, is 50 to 55%. The left ventricle has low normal function. The left ventricle has no regional wall motion abnormalities. Indeterminate diastolic filling due to E-A fusion.  2. Right ventricular systolic function is normal. The right ventricular size is normal. Tricuspid regurgitation signal is inadequate for assessing PA pressure.  3. The mitral valve is degenerative. No evidence of mitral valve regurgitation. No evidence of mitral stenosis.  4. The aortic valve is tricuspid. Aortic valve regurgitation is not visualized. No aortic stenosis is present.  5. The inferior vena cava is  normal in size with greater than 50% respiratory variability, suggesting right atrial pressure of 3 mmHg. Comparison(s): No prior Echocardiogram. Conclusion(s)/Recomendation(s): No intracardiac source of embolism detected on this transthoracic study. A transesophageal echocardiogram is recommended to exclude cardiac source of embolism if clinically indicated. FINDINGS  Left Ventricle: Left ventricular ejection fraction, by estimation, is 50 to 55%. The left ventricle has low normal function. The left ventricle has no regional wall motion abnormalities. Definity contrast agent was given IV to delineate the left ventricular endocardial borders. The left ventricular internal  cavity size was normal in size. There is no left ventricular hypertrophy. Indeterminate diastolic filling due to E-A fusion. Right Ventricle: The right ventricular size is normal. No increase in right ventricular wall thickness. Right ventricular systolic function is normal. Tricuspid regurgitation signal is inadequate for assessing PA pressure. Left Atrium: Left atrial size was normal in size. Right Atrium: Right atrial size was normal in size. Pericardium: A small pericardial effusion is present. The pericardial effusion is circumferential. Mitral Valve: The mitral valve is degenerative in appearance. Mild to moderate mitral annular calcification. No evidence of mitral valve regurgitation. No evidence of mitral valve stenosis. Tricuspid Valve: The tricuspid valve is grossly normal. Tricuspid valve regurgitation is not demonstrated. Aortic Valve: The aortic valve is tricuspid. Aortic valve regurgitation is not visualized. No aortic stenosis is present. Pulmonic Valve: The pulmonic valve was grossly normal. Pulmonic valve regurgitation is not visualized. No evidence of pulmonic stenosis. Aorta: The aortic root and ascending aorta are structurally normal, with no evidence of dilitation. Venous: The inferior vena cava is normal in size with greater than 50% respiratory variability, suggesting right atrial pressure of 3 mmHg. IAS/Shunts: No atrial level shunt detected by color flow Doppler.  LEFT VENTRICLE PLAX 2D LVIDd:         4.80 cm LVIDs:         3.60 cm LV PW:         0.90 cm LV IVS:        0.90 cm LVOT diam:     2.00 cm LV SV:         33.30 ml LV SV Index:   21.12 LVOT Area:     3.14 cm  RIGHT VENTRICLE RV S prime:     12.90 cm/s TAPSE (M-mode): 2.1 cm LEFT ATRIUM             Index LA diam:        3.60 cm 1.57 cm/m LA Vol (A2C):   49.8 ml 21.77 ml/m LA Vol (A4C):   67.2 ml 29.38 ml/m LA Biplane Vol: 62.7 ml 27.41 ml/m  AORTIC VALVE LVOT Vmax:   62.70 cm/s LVOT Vmean:  46.200 cm/s LVOT VTI:    0.106 m  AORTA  Ao Root diam: 2.90 cm Ao Asc diam:  3.20 cm  SHUNTS Systemic VTI:  0.11 m Systemic Diam: 2.00 cm Eleonore Chiquito MD Electronically signed by Eleonore Chiquito MD Signature Date/Time: 10/20/2019/3:39:46 PM    Final     Assessment/Plan: Diagnosis: 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   Stroke: Continue secondary stroke prophylaxis and Risk Factor Modification listed below:   Antiplatelet therapy:   Blood Pressure Management:  Continue current medication with prn's with permisive HTN per primary team Statin Agent:   Diabetes management:   Right sided hemiparesis: fit for orthosis to prevent contractures (resting hand splint for day, wrist cock up splint at  night, PRAFO, etc) PT/OT for mobility, ADL training  Motor recovery: Fluoxetine Labs independently reviewed.  Records reviewed and summated above.  1. Does the need for close, 24 hr/day medical supervision in concert with the patient's rehab needs make it unreasonable for this patient to be served in a less intensive setting? Yes 2. Co-Morbidities requiring supervision/potential complications: history of CVA, CAD, STM deficits, T2DM with neuropathy (Monitor in accordance with exercise and adjust meds as necessary), retinal detachment with left eye blindness, anemia (repeat labs, consider transfusion if necessary to ensure appropriate perfusion for increased activity tolerance)  3. Due to safety, disease management and patient education, does the patient require 24 hr/day rehab nursing? Yes 4. Does the patient require coordinated care of a physician, rehab nurse, therapy disciplines of PT/OT/SLP to address physical and functional deficits in the context of the above medical diagnosis(es)? Yes Addressing deficits in the following areas: balance, endurance, locomotion, strength, transferring, bathing, dressing, toileting, cognition and psychosocial  support 5. Can the patient actively participate in an intensive therapy program of at least 3 hrs of therapy per day at least 5 days per week? Yes 6. The potential for patient to make measurable gains while on inpatient rehab is excellent 7. Anticipated functional outcomes upon discharge from inpatient rehab are supervision  with PT, supervision with OT, n/a with SLP. 8. Estimated rehab length of stay to reach the above functional goals is: 15-18 days. 9. Anticipated discharge destination: Home 10. Overall Rehab/Functional Prognosis: good  RECOMMENDATIONS: This patient's condition is appropriate for continued rehabilitative care in the following setting: Recommend CIR if/when patient agreeable and medical work-up complete.  She would like to go home at present. Patient has agreed to participate in recommended program. Potentially Note that insurance prior authorization may be required for reimbursement for recommended care.  Comment: Rehab Admissions Coordinator to follow up.  I have personally performed a face to face diagnostic evaluation, including, but not limited to relevant history and physical exam findings, of this patient and developed relevant assessment and plan.  Additionally, I have reviewed and concur with the physician assistant's documentation above.   Delice Lesch, MD, ABPMR Bary Leriche, PA-C 10/22/2019

## 2019-10-22 NOTE — Progress Notes (Signed)
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  Patient has been having short-term memory issues.  Patient started having difficulty finding words and seemed disoriented.  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 EP cardiology has been consulted for this reason. TTE demonstrated no embolic source. TEE was performed this afternoon. Result is pending. If this is unrevealing EP recommends a LINQ monitor to look for atrial fibrillation.   The patient has been evaluated by PM&R for inpatient rehab. Will need fresh eval by PT/OT.  Consultants  . Neurology . Electrophysiology  Procedures  . TTE . TEE  Antibiotics   Anti-infectives (From admission, onward)   None    .  Subjective  The patient is resting comfortably. No new complaints.  Objective   Vitals:  Vitals:   10/22/19 1320 10/22/19 1634  BP: (!) 133/56 (!) 147/70  Pulse: 93 96  Resp: 20 20  Temp:  97.9 F (36.6 C)  SpO2: 93% 100%   Exam:  Constitutional:  . The patient is awake, alert, and oriented x 3. No acute distress. Eyes:  . pupils and irises appear normal . Normal lids and conjunctivae ENMT:  . grossly normal hearing  . Lips appear normal . external ears, nose appear normal . Oropharynx: mucosa, tongue,posterior pharynx appear normal Neck:  . neck appears normal, no masses, normal ROM, supple . no thyromegaly Respiratory:  . No increased work of breathing. . No wheezes, rales, or rhonchi . No tactile fremitus Cardiovascular:  . Regular rate and rhythm . No murmurs, ectopy, or gallups. . No lateral PMI. No thrills. Abdomen:  . Abdomen is soft, non-tender,  non-distended . No hernias, masses, or organomegaly . Normoactive bowel sounds.  Musculoskeletal:  . No cyanosis, clubbing, or edema Skin:  . No rashes, lesions, ulcers . palpation of skin: no induration or nodules Neurologic:  . CN 2-12 intact . Sensation all 4 extremities intact Psychiatric:  . Mental status o Mood, affect appropriate o Orientation to person, place, time  . judgment and insight appear intact  I have personally reviewed the following:   Today's Data  . Vitals, BMP, CBC  Micro Data  . Blood cultures x 2: No growth  Imaging  . MRI Brain . CT Brain  Cardiology Data  . TTE . TEE - pending  Scheduled Meds: . amitriptyline  25 mg Oral QHS  . atorvastatin  40 mg Oral q1800  . carvedilol  25 mg Oral BID  . clopidogrel  75 mg Oral Daily  . DULoxetine  60 mg Oral Daily  . enoxaparin (LOVENOX) injection  60 mg Subcutaneous QHS  . gabapentin  600 mg Oral QHS  . insulin aspart  0-20 Units Subcutaneous TID WC  . insulin detemir  15 Units Subcutaneous QHS   Continuous Infusions: . sodium chloride 75 mL/hr at 10/22/19 0600    Principal Problem:   CVA (cerebral vascular accident) [facility]) Active Problems:   Tachycardia   Essential hypertension   Anemia   Insulin dependent type 2 diabetes mellitus ([place])   Peripheral neuropathy   CAD (coronary artery disease)   History of CVA (cerebrovascular accident)   Diabetic peripheral neuropathy ([place])   LOS: 3 days  A & P  Left MCA stroke: MRI showed several subcentimeter acute/early subacute infarcts within the left frontal and temporal lobes there is additional small acute/early subacute infarct within the body of corpus callosum.  CT angiogram head and neck showed no large or medium vessel intracranial occlusion, sclerotic disease at both carotid bifurcation but no stenosis.  Neurology is following.  Continue Plavix, atorvastatin.  Echocardiogram showed EF 50 to 55%, indeterminate diastolic filling. No embolic  source seen on TTE. PE consulted. TEE is pending. If TEE is unrevealing EP has recommended LINQ monitor to evaluate for atrial fibrillation.  Diabetes mellitus type 2-continue Levemir 50 units subcu daily, sliding scale insulin with NovoLog. Glucoses have run from 86 - 214 in the last 24 hours.   Hypertension: Blood pressures normotensive.  Losartan on hold for permissive hypertension.  Blood pressure stable.  History of CAD-stable: The patient has been continued on Coreg, Plavix, and Lipitor. Continue telemetry monitoring. No chest pain.  I have seen and examined this patient myself. I have spent 35 minutes in her evaluation and care.  DVT prophylaxis: SCD's CODE STATUS: Full Code Family Communication: None at bedside Disposition: From Home. Likely to CIR vs Home at discharge.  Mykeisha Dysert, DO [facility] Direct contact: see [email or web address]  7PM-7AM contact night coverage as above 10/22/2019, 6:12 PM  LOS: 3 days

## 2019-10-22 NOTE — Consult Note (Addendum)
ELECTROPHYSIOLOGY CONSULT NOTE  Patient ID: Regina Wolfe MRN: [id number], DOB/AGE: 1956-07-17   Admit date: 10/19/2019 Date of Consult: 10/22/2019  Primary Physician: Patient, No Pcp Per Primary Cardiologist:none Reason for Consultation: Cryptogenic stroke ; recommendations regarding Implantable Loop Recorder, requested by Dr. Leonie Man  History of Present Illness Regina Wolfe was admitted on 10/19/2019 with recurrent falls, progressive gait instability, disorientation, recently was evaluated at a hospital in [place] w/TIA.  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  she has undergone workup for stroke including echocardiogram and carotid angio.  The patient has been monitored on telemetry which has demonstrated sinus rhythm , and SVT, no AFib/flutter.  Inpatient stroke work-up is to be completed with a TEE.    Echocardiogram this admission demonstrated   IMPRESSIONS  1. Left ventricular ejection fraction, by estimation, is 50 to 55%. The  left ventricle has low normal function. The left ventricle has no regional  wall motion abnormalities. Indeterminate diastolic filling due to E-A  fusion.   2. Right ventricular systolic function is normal. The right ventricular  size is normal. Tricuspid regurgitation signal is inadequate for assessing  PA pressure.   3. The mitral valve is degenerative. No evidence of mitral valve  regurgitation. No evidence of mitral stenosis.   4. The aortic valve is tricuspid. Aortic valve regurgitation is not  visualized. No aortic stenosis is present.   5. The inferior vena cava is normal in size with greater than 50%  respiratory variability, suggesting right atrial pressure of 3 mmHg.    Lab work is  reviewed.  Prior to admission, the patient denies chest pain, shortness of breath, dizziness, palpitations, or syncope.   She is known to have sinus tachycardia,, has never had palpitations, no known arrhythmias They are recovering from their stroke with plans to CIR at discharge.   Past Medical History:  Diagnosis Date   Diabetes mellitus without complication ([place])    Stroke [facility])      Surgical History:  Past Surgical History:  Procedure Laterality Date   ABDOMINAL HYSTERECTOMY     Total   CESAREAN SECTION  1978   Right and Left Eye Surgery for Detached Retinas       Medications Prior to Admission  Medication Sig Dispense Refill Last Dose   amitriptyline (ELAVIL) 50 MG tablet Take 25 mg by mouth at bedtime.   10/18/2019 at Unknown time   atorvastatin (LIPITOR) 40 MG tablet Take 40 mg by mouth at bedtime.   10/18/2019 at Unknown time   carvedilol (COREG) 25 MG tablet Take 25 mg by mouth 2 (two) times daily.   10/17/2019 at 2030   clopidogrel (PLAVIX) 75 MG tablet Take 75 mg by mouth daily.   10/18/2019 at Unknown time   DULoxetine (CYMBALTA) 60 MG capsule Take 60 mg by mouth daily.   10/18/2019 at Unknown time   gabapentin (NEURONTIN) 600 MG tablet Take 600 mg by mouth at bedtime.   10/18/2019 at Unknown time   glyBURIDE-metformin (GLUCOVANCE) 5-500 MG tablet Take 2 tablets by mouth 2 (two) times daily.   10/18/2019 at Unknown time   insulin detemir (LEVEMIR) 100 UNIT/ML injection Inject 15 Units into the skin every evening.   10/18/2019 at Unknown time  insulin lispro (HUMALOG) 100 UNIT/ML injection Inject 15-30 Units into the skin 3 (three) times daily with meals. Inject 15 units if blood sugar is below 350mg /dL; Inject 30 units if blood sugar is above 350mg /dL   10/19/2019 at Unknown time   losartan (COZAAR) 100 MG tablet Take 100 mg by mouth daily.   10/18/2019 at Unknown time   oxyCODONE-acetaminophen (PERCOCET) 10-325 MG tablet Take 1 tablet by mouth every 6 (six) hours as needed for  pain.    10/18/2019 at Unknown time   vitamin B-12 (CYANOCOBALAMIN) 100 MCG tablet Take 100 mcg by mouth daily.   10/18/2019 at Unknown time   Vitamin D, Ergocalciferol, (DRISDOL) 1.25 MG (50000 UNIT) CAPS capsule Take 50,000 Units by mouth once a week.   Past Week at Unknown time    Inpatient Medications:   amitriptyline  25 mg Oral QHS   atorvastatin  40 mg Oral q1800   carvedilol  25 mg Oral BID   clopidogrel  75 mg Oral Daily   DULoxetine  60 mg Oral Daily   enoxaparin (LOVENOX) injection  60 mg Subcutaneous QHS   gabapentin  600 mg Oral QHS   insulin aspart  0-20 Units Subcutaneous TID WC   insulin detemir  15 Units Subcutaneous QHS    Allergies:  Allergies  Allergen Reactions   Latex Other (See Comments)    "Hands turned black with extreme swelling"   Morphine And Related Hives    Social History   Socioeconomic History   Marital status: Divorced    Spouse name: Not on file   Number of children: Not on file   Years of education: Not on file   Highest education level: Not on file  Occupational History   Not on file  Tobacco Use   Smoking status: Former Smoker    Types: Cigarettes   Smokeless tobacco: Never Used   Tobacco comment: smoked for 1 year when she was 64 and quit  Substance and Sexual Activity   Alcohol use: Never   Drug use: Never   Sexual activity: Not Currently  Other Topics Concern   Not on file  Social History Narrative   Not on file   Social Determinants of Health   Financial Resource Strain:    Difficulty of Paying Living Expenses: Not on file  Food Insecurity:    Worried About [place] in the Last Year: Not on file   Ran Out of Food in the Last Year: Not on file  Transportation Needs:    Lack of Transportation (Medical): Not on file   Lack of Transportation (Non-Medical): Not on file  Physical Activity:    Days of Exercise per Week: Not on file   Minutes of Exercise per Session: Not on file  Stress:    Feeling of Stress :  Not on file  Social Connections:    Frequency of Communication with Friends and Family: Not on file   Frequency of Social Gatherings with Friends and Family: Not on file   Attends Religious Services: Not on file   Active Member of Clubs or Organizations: Not on file   Attends Archivist Meetings: Not on file   Marital Status: Not on file  Intimate Partner Violence:    Fear of Current or Ex-Partner: Not on file   Emotionally Abused: Not on file   Physically Abused: Not on file   Sexually Abused: Not on file     Family History  Problem Relation Age of  Onset   Diabetes Mother    Heart attack Mother    Cancer Sister        ovarian and cervical    Leukemia Sister       Review of Systems: All other systems reviewed and are otherwise negative except as noted above.   Physical Exam: Vitals:   10/21/19 2018 10/21/19 2332 10/22/19 0336 10/22/19 0840  BP: (!) 151/79 (!) 151/89 (!) 143/80 (!) 150/79  Pulse: 97 99 100 100  Resp: 16 16 18 16   Temp: 98.3 F (36.8 C) 98.6 F (37 C) 98.1 F (36.7 C) 98.6 F (37 C)  TempSrc: Oral Oral Oral Oral  SpO2: 96% 94% 93% 97%  Weight:      Height:        GEN- The patient is well appearing, alert and oriented x 3 today.   Head- normocephalic, atraumatic Eyes-  Sclera clear, conjunctiva pink Ears- hearing intact Oropharynx- clear Neck- supple Lungs- CTA b/l, normal work of breathing Heart- RRR, no murmurs, rubs or gallops  GI- soft, NT, ND Extremities- no clubbing, cyanosis, or edema MS- no significant deformity or atrophy Skin- no rash or lesion Psych- euthymic mood, full affect   Labs:   Lab Results  Component Value Date   WBC 6.2 10/22/2019   HGB 10.9 (L) 10/22/2019   HCT 34.2 (L) 10/22/2019   MCV 79.7 (L) 10/22/2019   PLT 333 10/22/2019    Recent Labs  Lab 10/19/19 1436 10/19/19 1436 10/22/19 0205  NA 137   < > 141  K 4.0   < > 3.7  CL 100   < > 108  CO2 27   < > 25  BUN 12   < > 8  CREATININE 0.72    < > 0.62  CALCIUM 8.4*   < > 8.4*  PROT 7.5  --   --   BILITOT 0.5  --   --   ALKPHOS 60  --   --   ALT 17  --   --   AST 17  --   --   GLUCOSE 371*   < > 161*   < > = values in this interval not displayed.   No results found for: CKTOTAL, CKMB, CKMBINDEX, TROPONINI Lab Results  Component Value Date   CHOL 113 10/20/2019   Lab Results  Component Value Date   HDL 43 10/20/2019   Lab Results  Component Value Date   LDLCALC 57 10/20/2019   Lab Results  Component Value Date   TRIG 63 10/20/2019   Lab Results  Component Value Date   CHOLHDL 2.6 10/20/2019   No results found for: LDLDIRECT  No results found for: DDIMER   Radiology/Studies:   CT ANGIO HEAD W OR WO CONTRAST Result Date: 10/20/2019 CLINICAL DATA:  Confusion. Incontinence. Left MCA strokes seen by MRI. EXAM: CT ANGIOGRAPHY HEAD AND NECK TECHNIQUE: Multidetector CT imaging of the head and neck was performed using the standard protocol during bolus administration of intravenous contrast. Multiplanar CT image reconstructions and MIPs were obtained to evaluate the vascular anatomy. Carotid stenosis measurements (when applicable) are obtained utilizing NASCET criteria, using the distal internal carotid diameter as the denominator. CONTRAST:  129mL OMNIPAQUE IOHEXOL 350 MG/ML SOLN COMPARISON:  MRI 10/19/2019 FINDINGS: CT HEAD FINDINGS Brain: Old infarction in the pons. No focal cerebellar finding. Cerebral hemispheres show old lacunar infarctions of the thalami, right basal ganglia and affecting the cerebral hemispheric white matter. No large vessel territory infarction. No  mass lesion, hemorrhage, hydrocephalus or extra-axial collection. Vascular: There is atherosclerotic calcification of the major vessels at the base of the brain. Skull: Negative Sinuses: Clear Orbits: Normal Review of the MIP images confirms the above findings CTA NECK FINDINGS Aortic arch: Normal Right carotid system: Common carotid artery widely patent the  bifurcation. Calcified plaque at the carotid bifurcation and ICA bulb but no stenosis. Cervical ICA widely patent. Left carotid system: Common carotid artery widely patent to the bifurcation. Calcified plaque at the carotid bifurcation and ICA bulb but no stenosis. Cervical ICA widely patent. Vertebral arteries: Both vertebral artery origins widely patent. Both vertebral arteries widely patent through the cervical region to the foramen magnum. Skeleton: Mild cervical spondylosis. Other neck: No soft tissue mass or lymphadenopathy. Upper chest: Normal Review of the MIP images confirms the above findings CTA HEAD FINDINGS Anterior circulation: Both internal carotid arteries are patent through the skull base and siphon regions. There is calcified plaque in both carotid siphon regions but no stenosis greater than 50% suspected. The anterior and middle cerebral vessels are patent. No large or medium vessel occlusion is identified. No correctable proximal stenosis. No aneurysm or vascular malformation. Posterior circulation: Both vertebral arteries are patent at the foramen magnum. There is calcified plaque in both vertebral V4 segments but no stenosis greater than 30%. Both vertebral arteries reach the basilar. No basilar stenosis. Posterior circulation branch vessels are patent. Venous sinuses: Patent and normal. Anatomic variants: None significant. Review of the MIP images confirms the above findings IMPRESSION: No large or medium vessel intracranial occlusion. Atherosclerotic disease at both carotid bifurcations but no stenosis or significant irregularity. Atherosclerotic change in both carotid siphon regions and both vertebral artery V4 segments, but no flow limiting stenosis. Electronically Signed   By: Nelson Chimes M.D.   On: 10/20/2019 00:11     MR BRAIN WO CONTRAST Result Date: 10/19/2019 CLINICAL DATA:  Focal neuro deficit, greater than 6 hours, stroke suspected. Additional history provided: Confusion,  incontinence, history of stroke in 2018 EXAM: MRI HEAD WITHOUT CONTRAST TECHNIQUE: Multiplanar, multiecho pulse sequences of the brain and surrounding structures were obtained without intravenous contrast. COMPARISON:  No pertinent prior studies available for comparison. FINDINGS: Brain: Intermittently motion degraded examination. Most notably there is moderate motion degradation of the axial T2 FLAIR and coronal T2 weighted sequences. There are multiple subcentimeter acute/early subacute infarcts within the left MCA vascular territory within the anterior left frontal lobe subcortical white matter (series 11, image 42) and a cortical/subcortical focus within the left frontal lobe motor strip (series 11, image 43), as well as within the anterior left temporal lobe (series 11, image 35). There is an additional subcentimeter acute/early subacute infarct within the body of the corpus callosum (series 11, image 41). Background advanced patchy and confluent T2/FLAIR hyperintensity within the cerebral white matter and pons is nonspecific, but consistent with chronic small vessel ischemic disease. There are also numerous chronic lacunar infarcts within the bilateral cerebral white matter, basal ganglia, thalami and within the pons. There is also a focus of encephalomalacia within the anterior body/genu of the corpus callosum which may be post ischemic. No evidence of intracranial mass. No midline shift or extra-axial fluid collection. No chronic intracranial blood products. Moderate generalized parenchymal atrophy. Vascular: Flow voids maintained within the proximal large arterial vessels. Skull and upper cervical spine: No focal marrow lesion. Incompletely assessed upper cervical spondylosis. This includes a C3-C4 disc bulge partially effacing the ventral thecal sac and possibly contacting the ventral spinal cord.  Sinuses/Orbits: Visualized orbits demonstrate no acute abnormality. No significant paranasal sinus disease or  mastoid effusion. IMPRESSION: Intermittently motion degraded examination. Several subcentimeter acute/early subacute infarcts within the left frontal and temporal lobes within the left MCA vascular territory. There is an additional small acute/early subacute infarct within the body of the corpus callosum. Correlate for an embolic phenomenon, possibly central or within the left carotid system. Background advanced chronic small vessel ischemic disease with multiple chronic lacunar infarcts as described. Moderate generalized parenchymal atrophy. Electronically Signed   By: Kellie Simmering DO   On: 10/19/2019 16:53    DG Chest Port 1V same Day Result Date: 10/21/2019 CLINICAL DATA:  Fever. EXAM: PORTABLE CHEST 1 VIEW COMPARISON:  No recent. FINDINGS: Heart size stable. Low lung volumes. Mild bibasilar atelectasis. Mild infiltrates can not be excluded. No pleural effusion or pneumothorax. Old left sixth rib fracture. No acute bony abnormality. Degenerative change thoracic spine. IMPRESSION: Low lung volumes with mild bibasilar atelectasis. Mild basilar infiltrates cannot be excluded. Electronically Signed   By: Marcello Moores  Register   On: 10/21/2019 13:47      12-lead ECG SR, ST All prior EKG's in EPIC reviewed with no documented atrial fibrillation  Telemetry SR, has ST as well, rates to 130's, this trennds up and then down again, does notbehave like an SVT. No arrhythmias  Assessment and Plan:  1. Cryptogenic stroke The patient presents with cryptogenic stroke.  The patient has a TEE planned for this afternoon.  I spoke at length with the patient about monitoring for afib with either a 30 day event monitor or an implantable loop recorder.  Risks, benefits, and alteratives to implantable loop recorder were discussed with the patient today.   At this time, the patient is very clear in their decision to proceed with implantable loop recorder.   Wound care was reviewed with the patient (keep incision clean and  dry for 3 days).  Wound check Hiliary Osorto scheduled for the patient.  She is comfortable and agreeable having follow up in [place] from her hoome in [place].  Please call with questions.   Baldwin Jamaica, PA-C 10/22/2019  I have seen and examined this patient with Tommye Standard.  Agree with above, note added to reflect my findings.  On exam, RRR, no murmurs, lungs clear.  Patient presented to the hospital with cryptogenic stroke. To date, no cause has been found. TEE planned for today. If unrevealing, Sekai Nayak plan for LINQ monitor to look for atrial fibrillation. Risks and benefits discussed. Risks include but not limited to bleeding and infection. The patient understands the risks and has agreed to the procedure.  Ocean Kearley M. Jeremaih Klima MD 10/22/2019 12:48 PM

## 2019-10-22 NOTE — H&P (View-Only) (Signed)
    [facility] has been requested to perform a transesophageal echocardiogram on Regina Wolfe for cryptogenic stroke.  After careful review of history and examination, the risks and benefits of transesophageal echocardiogram have been explained including risks of esophageal damage, perforation (1:10,000 risk), bleeding, pharyngeal hematoma as well as other potential complications associated with conscious sedation including aspiration, arrhythmia, respiratory failure and death. Alternatives to treatment were discussed, questions were answered. Patient is willing to proceed.    The patient denies any hx of dysphagia now or historically, she does c/o of a scratchy/sore throat currently She denies any issues with sedation or anesthesia historically She uses percocet Q6hrs daily for neuropathy pain , she is scheduled for MAC   Baldwin Jamaica, PA-C  10/22/2019 9:49 AM

## 2019-10-22 NOTE — Progress Notes (Signed)
  Echocardiogram Echocardiogram Transesophageal has been performed.  Regina Wolfe [name] [name] 10/22/2019, 12:55 PM

## 2019-10-22 NOTE — Plan of Care (Signed)
  Problem: Education: Goal: Knowledge of General Education information will improve Description Including pain rating scale, medication(s)/side effects and non-pharmacologic comfort measures Outcome: Progressing   

## 2019-10-22 NOTE — Interval H&P Note (Signed)
History and Physical Interval Note:  10/22/2019 12:14 PM  Regina Wolfe  has presented today for surgery, with the diagnosis of stroke.  The various methods of treatment have been discussed with the patient and family. After consideration of risks, benefits and other options for treatment, the patient has consented to  Procedure(s): TRANSESOPHAGEAL ECHOCARDIOGRAM (TEE) (N/A) as a surgical intervention.  The patient's history has been reviewed, patient examined, no change in status, stable for surgery.  I have reviewed the patient's chart and labs.  Questions were answered to the patient's satisfaction.     Trev Boley

## 2019-10-22 NOTE — Discharge Instructions (Signed)
Post implant wound care instructions °Keep incision clean and dry for 3 days. °You can remove outer dressing tomorrow. °Leave steri-strips (little pieces of tape) on until seen in the office for wound check appointment. °Call the office ([phone]) for redness, drainage, swelling, or fever. ° °

## 2019-10-22 NOTE — Transfer of Care (Signed)
Immediate Anesthesia Transfer of Care Note  Patient: Regina Wolfe  Procedure(s) Performed: TRANSESOPHAGEAL ECHOCARDIOGRAM (TEE) (N/A ) BUBBLE STUDY  Patient Location: Endoscopy Unit  Anesthesia Type:MAC  Level of Consciousness: drowsy and patient cooperative  Airway & Oxygen Therapy: Patient Spontanous Breathing and Patient connected to nasal cannula oxygen  Post-op Assessment: Report given to RN  Post vital signs: Reviewed and stable  Last Vitals:  Vitals Value Taken Time  BP    Temp    Pulse 96 10/22/19 1252  Resp 37 10/22/19 1252  SpO2 92 % 10/22/19 1252  Vitals shown include unvalidated device data.  Last Pain:  Vitals:   10/22/19 1152  TempSrc: Oral  PainSc: 9       Patients Stated Pain Goal: 0 (77/41/28 7867)  Complications: No apparent anesthesia complications

## 2019-10-22 NOTE — Op Note (Signed)
INDICATIONS: cryptogenic stroke  PROCEDURE:   Informed consent was obtained prior to the procedure. The risks, benefits and alternatives for the procedure were discussed and the patient comprehended these risks.  Risks include, but are not limited to, cough, sore throat, vomiting, nausea, somnolence, esophageal and stomach trauma or perforation, bleeding, low blood pressure, aspiration, pneumonia, infection, trauma to the teeth and death.    After a procedural time-out, the oropharynx was anesthetized with 20% benzocaine spray.   During this procedure the patient was administered IV propofol by Anesthesiology. The transesophageal probe was inserted in the esophagus and stomach without difficulty and multiple views were obtained.  The patient was kept under observation until the patient left the procedure room.  The patient left the procedure room in stable condition.   Agitated microbubble saline contrast was administered.  COMPLICATIONS:    There were no immediate complications.  FINDINGS:  No intracardiac mass or thrombus, no ASD/PFO by saline contrast,  No significant aortic atherosclerosis.  RECOMMENDATIONS:     Loop recorder implantation.  Time Spent Directly with the Patient:  30 minutes   Regina Wolfe 10/22/2019, 12:40 PM

## 2019-10-22 NOTE — Progress Notes (Signed)
STROKE TEAM PROGRESS NOTE   INTERVAL HISTORY Patient is sitting up in bed.  She states she is doing better and speech is much improved.  She does not seem to be having significant word hesitancy or perseveration of ideas today.  She is scheduled to undergo TEE later today.  She decided not to participate in the sleep smart study Vitals:   10/22/19 1252 10/22/19 1300 10/22/19 1310 10/22/19 1320  BP: (!) 94/43 100/62 109/62 (!) 133/56  Pulse: 96 97 96 93  Resp: (!) 28 (!) 24 18 20   Temp: 98.8 F (37.1 C)     TempSrc: Oral     SpO2: 93% 94% 96% 93%  Weight:      Height:        CBC:  Recent Labs  Lab 10/19/19 1436 10/22/19 0205  WBC 8.0 6.2  NEUTROABS 5.0  --   HGB 11.5* 10.9*  HCT 37.6 34.2*  MCV 82.6 79.7*  PLT 381 0000000    Basic Metabolic Panel:  Recent Labs  Lab 10/19/19 1436 10/22/19 0205  NA 137 141  K 4.0 3.7  CL 100 108  CO2 27 25  GLUCOSE 371* 161*  BUN 12 8  CREATININE 0.72 0.62  CALCIUM 8.4* 8.4*   Lipid Panel:     Component Value Date/Time   CHOL 113 10/20/2019 0658   TRIG 63 10/20/2019 0658   HDL 43 10/20/2019 0658   CHOLHDL 2.6 10/20/2019 0658   VLDL 13 10/20/2019 0658   LDLCALC 57 10/20/2019 0658   HgbA1c:  Lab Results  Component Value Date   HGBA1C 8.7 (H) 10/19/2019   Urine Drug Screen:     Component Value Date/Time   LABOPIA NONE DETECTED 10/19/2019 1712   COCAINSCRNUR NONE DETECTED 10/19/2019 1712   LABBENZ NONE DETECTED 10/19/2019 1712   AMPHETMU NONE DETECTED 10/19/2019 1712   THCU NONE DETECTED 10/19/2019 1712   LABBARB NONE DETECTED 10/19/2019 1712    Alcohol Level     Component Value Date/Time   ETH <10 10/19/2019 1436    IMAGING past 48 hours DG Chest Port 1V same Day  Result Date: 10/21/2019 CLINICAL DATA:  Fever. EXAM: PORTABLE CHEST 1 VIEW COMPARISON:  No recent. FINDINGS: Heart size stable. Low lung volumes. Mild bibasilar atelectasis. Mild infiltrates can not be excluded. No pleural effusion or pneumothorax. Old  left sixth rib fracture. No acute bony abnormality. Degenerative change thoracic spine. IMPRESSION: Low lung volumes with mild bibasilar atelectasis. Mild basilar infiltrates cannot be excluded. Electronically Signed   By: Marcello Moores  Register   On: 10/21/2019 13:47    PHYSICAL EXAM Obese middle-aged African-American lady not in distress. . Afebrile. Head is nontraumatic. Neck is supple without bruit.    Cardiac exam no murmur or gallop. Lungs are clear to auscultation. Distal pulses are well felt. Neurological Exam ; ;  She is awake alert she is oriented to place and person.  She hasfluent speech with no expressive difficulties.  She is able to name repeat and comprehend well.  She follows simple one-step midline and two-step commands.  Extraocular movements are full range without nystagmus.  She is blind in the left eye.  She blinks to threat in the right eye not in the left.  Face is symmetric.  Tongue midline.  Motor system exam shows poor cooperation but mild left upper and lower extremity drift.  Mild weakness of left grip and intrinsic hand muscles.  Orbits right over left upper extremity.  Mild weakness of left hip flexors  and ankle dorsiflexors.  Tone is symmetric.  Sensation appears intact bilaterally.  Gait not tested.   ASSESSMENT/PLAN [name] is a 64 y.o. 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 Small corpus callosum infarct. Small vessel disease. Atrophy.       CTA head & neck no sign stenosis or occlusion. B  ICA bifurcation atherosclerosis. B V4 atherosclerosis.   2D Echo EF 50-55%. No source of embolus   TEE to look for embolic source. Arranged with [facility] for Thursday. If positive for PFO (patent foramen ovale), check bilateral lower extremity venous dopplers to rule out DVT as possible source of stroke. (I have made patient NPO after midnight tonight).  If TEE negative, a [facility] electrophysiologist will consult and consider placement of an implantable loop recorder to evaluate for atrial fibrillation as etiology of stroke. This has been explained to patient/family by Dr. Leonie Man and they are agreeable.   LDL 57  HgbA1c 8.7  Lovenox 60 mg sq daily for VTE prophylaxis  clopidogrel 75 mg daily prior to admission, now on aspirin 81 mg daily and clopidogrel 75 mg daily. continue DAPT x 3 weeks then plavix alone    Therapy recommendations: CLR  Disposition:  pending   Hypertension  BP elevated 190s on arrival  Stable . Permissive hypertension (OK if < 220/120) but gradually normalize in 5-7 days . Long-term BP goal normotensive  Hyperlipidemia  Home meds:  On lipitor 40, resumed in hospital  LDL 57, goal < 70  Continue statin at discharge  Diabetes type II Uncontrolled Peripheral Neuropathy  HgbA1c 8.7, goal < 7.0  CBGs Recent Labs    10/21/19 2108 10/22/19 0608 10/22/19 0842  GLUCAP 214* 131* 102*      SSI  Other Stroke Risk Factors  Hx stroke/TIA   2018 - put on plavix. Resultant mild expressive aphasia and R sided weakness from last stroke. Walks with cane. No futher details available in EPIC   CAD  Possible obstructive sleep apnea. Patient interested in participating in the [facility] will follow up for possible enrollment. Please contact them at [phone] for any questions.    Other Active Problems  Hx OS retinal detachment w/ resultant blindness  Hx  cervical cancer s/p hysterectomy  Anemia   Hospital day # 3   She presented with speech difficulties and left-sided weakness likely due to bicerebral embolic infarcts.  Continue ongoing stroke work-up and evaluate for cardiac source of embolism with TEE and loop recorder. D/W Dr Benny Lennert Antony Contras, MD Medical Director [facility] Pager: [phone] 10/22/2019 2:10 PM   To contact Stroke Continuity provider, please refer to [email or web address] After hours, contact General Neurology

## 2019-10-22 NOTE — Anesthesia Preprocedure Evaluation (Addendum)
Anesthesia Evaluation  Patient identified by MRN, date of birth, ID band Patient awake    Reviewed: Allergy & Precautions, H&P , NPO status , Patient's Chart, lab work & pertinent test results, reviewed documented beta blocker date and time   Airway Mallampati: III  TM Distance: >3 FB Neck ROM: Full    Dental no notable dental hx. (+) Teeth Intact, Dental Advisory Given   Pulmonary neg pulmonary ROS, former smoker,    Pulmonary exam normal breath sounds clear to auscultation       Cardiovascular hypertension, Pt. on medications and Pt. on home beta blockers  Rhythm:Regular Rate:Normal     Neuro/Psych CVA, Residual Symptoms negative neurological ROS  negative psych ROS   GI/Hepatic negative GI ROS, Neg liver ROS,   Endo/Other  diabetes, Insulin Dependent, Oral Hypoglycemic AgentsMorbid obesity  Renal/GU negative Renal ROS  negative genitourinary   Musculoskeletal   Abdominal   Peds  Hematology  (+) Blood dyscrasia, anemia ,   Anesthesia Other Findings   Reproductive/Obstetrics negative OB ROS                            Anesthesia Physical Anesthesia Plan  ASA: III  Anesthesia Plan: MAC   Post-op Pain Management:    Induction: Intravenous  PONV Risk Score and Plan: 2 and Propofol infusion and Treatment may vary due to age or medical condition  Airway Management Planned: Nasal Cannula  Additional Equipment:   Intra-op Plan:   Post-operative Plan:   Informed Consent: I have reviewed the patients History and Physical, chart, labs and discussed the procedure including the risks, benefits and alternatives for the proposed anesthesia with the patient or authorized representative who has indicated his/her understanding and acceptance.     Dental advisory given  Plan Discussed with: CRNA  Anesthesia Plan Comments:         Anesthesia Quick Evaluation

## 2019-10-22 NOTE — Progress Notes (Signed)
PT Cancellation Note  Patient Details Name: Regina Wolfe MRN: [id number] DOB: 09-11-1955   Cancelled Treatment:    Reason Eval/Treat Not Completed: Patient at procedure or test/unavailable (TEE).    Wyona Almas, PT, DPT Acute Rehabilitation Services Pager [phone] Office [phone]    Deno Etienne 10/22/2019, 11:59 AM

## 2019-10-22 NOTE — Consult Note (Signed)
    [facility] has been requested to perform a transesophageal echocardiogram on Regina Wolfe for cryptogenic stroke.  After careful review of history and examination, the risks and benefits of transesophageal echocardiogram have been explained including risks of esophageal damage, perforation (1:10,000 risk), bleeding, pharyngeal hematoma as well as other potential complications associated with conscious sedation including aspiration, arrhythmia, respiratory failure and death. Alternatives to treatment were discussed, questions were answered. Patient is willing to proceed.    The patient denies any hx of dysphagia now or historically, she does c/o of a scratchy/sore throat currently She denies any issues with sedation or anesthesia historically She uses percocet Q6hrs daily for neuropathy pain , she is scheduled for MAC   Baldwin Jamaica, PA-C  10/22/2019 9:49 AM

## 2019-10-22 NOTE — Progress Notes (Signed)
Inpatient Rehabilitation-Admissions Coordinator   Met with pt and her daughter at the bedside as follow up from PM&R consult. We discussed recommended program, expectations, anticipated LOS, and expected functional outcomes. Pt's daughter and brother have confirmed DC support.   Both pt and her daughter are in favor of IP Rehab program. I will need updated therapy notes as pt's evals were limited due to tachycardia. Will follow for updated note and begin insurance authorization process for possible admit.   Raechel Ache, OTR/L  Rehab Admissions Coordinator  [phone] 10/22/2019 5:53 PM

## 2019-10-22 NOTE — Interval H&P Note (Signed)
History and Physical Interval Note:  10/22/2019 12:14 PM  Regina Wolfe  has presented today for surgery, with the diagnosis of stroke.  The various methods of treatment have been discussed with the patient and family. After consideration of risks, benefits and other options for treatment, the patient has consented to  Procedure(s): TRANSESOPHAGEAL ECHOCARDIOGRAM (TEE) (N/A) as a surgical intervention.  The patient's history has been reviewed, patient examined, no change in status, stable for surgery.  I have reviewed the patient's chart and labs.  Questions were answered to the patient's satisfaction.     Anjanae Woehrle

## 2019-10-22 NOTE — Anesthesia Procedure Notes (Signed)
Procedure Name: MAC Date/Time: 10/22/2019 12:20 PM Performed by: Barrington Ellison, CRNA Pre-anesthesia Checklist: Patient identified, Emergency Drugs available, Suction available and Patient being monitored Patient Re-evaluated:Patient Re-evaluated prior to induction Oxygen Delivery Method: Nasal cannula

## 2019-10-22 NOTE — Anesthesia Postprocedure Evaluation (Signed)
Anesthesia Post Note  Patient: Regina Wolfe  Procedure(s) Performed: TRANSESOPHAGEAL ECHOCARDIOGRAM (TEE) (N/A ) BUBBLE STUDY     Patient location during evaluation: Endoscopy Anesthesia Type: MAC Level of consciousness: awake and alert Pain management: pain level controlled Vital Signs Assessment: post-procedure vital signs reviewed and stable Respiratory status: spontaneous breathing, nonlabored ventilation and respiratory function stable Cardiovascular status: stable and blood pressure returned to baseline Postop Assessment: no apparent nausea or vomiting Anesthetic complications: no    Last Vitals:  Vitals:   10/22/19 1310 10/22/19 1320  BP: 109/62 (!) 133/56  Pulse: 96 93  Resp: 18 20  Temp:    SpO2: 96% 93%    Last Pain:  Vitals:   10/22/19 1320  TempSrc:   PainSc: 0-No pain                 Tarry Blayney,W. EDMOND

## 2019-10-23 LAB — COMPREHENSIVE METABOLIC PANEL
ALT: 26 U/L (ref 0–44)
AST: 32 U/L (ref 15–41)
Albumin: 2.3 g/dL — ABNORMAL LOW (ref 3.5–5.0)
Alkaline Phosphatase: 49 U/L (ref 38–126)
Anion gap: 8 (ref 5–15)
BUN: 13 mg/dL (ref 8–23)
CO2: 24 mmol/L (ref 22–32)
Calcium: 8.1 mg/dL — ABNORMAL LOW (ref 8.9–10.3)
Chloride: 105 mmol/L (ref 98–111)
Creatinine, Ser: 0.68 mg/dL (ref 0.44–1.00)
GFR calc Af Amer: >60 mL/min (ref >60)
GFR calc non Af Amer: >60 mL/min (ref >60)
Glucose, Bld: 196 mg/dL — ABNORMAL HIGH (ref 70–99)
Potassium: 3.7 mmol/L (ref 3.5–5.1)
Sodium: 137 mmol/L (ref 135–145)
Total Bilirubin: 0.4 mg/dL (ref 0.3–1.2)
Total Protein: 5.8 g/dL — ABNORMAL LOW (ref 6.5–8.1)

## 2019-10-23 LAB — GLUCOSE, CAPILLARY
Glucose-Capillary: 290 mg/dL — ABNORMAL HIGH (ref 70–99)
Glucose-Capillary: 181 mg/dL — ABNORMAL HIGH (ref 70–99)
Glucose-Capillary: 276 mg/dL — ABNORMAL HIGH (ref 70–99)
Glucose-Capillary: 257 mg/dL — ABNORMAL HIGH (ref 70–99)
Glucose-Capillary: 190 mg/dL — ABNORMAL HIGH (ref 70–99)

## 2019-10-23 MED ORDER — GABAPENTIN 600 MG PO TABS: 600.0000 mg | ORAL_TABLET | Freq: Every day | ORAL | Status: DC

## 2019-10-23 MED ORDER — OXYCODONE-ACETAMINOPHEN 10-325 MG PO TABS: 1.0000 | ORAL_TABLET | Freq: Four times a day (QID) | ORAL | Status: DC | PRN

## 2019-10-23 MED ORDER — DOCUSATE SODIUM 100 MG PO CAPS
200.0000 mg | ORAL_CAPSULE | Freq: Every day | ORAL | Status: DC
  Administered 2019-10-23 – 2019-10-24 (×2): 200 mg via ORAL
  Filled 2019-10-23 (×2): qty 2

## 2019-10-23 MED ORDER — OXYCODONE-ACETAMINOPHEN 5-325 MG PO TABS
1.0000 | ORAL_TABLET | Freq: Four times a day (QID) | ORAL | Status: DC | PRN
  Administered 2019-10-23 – 2019-10-24 (×2): 1 via ORAL
  Filled 2019-10-23 (×2): qty 1

## 2019-10-23 MED ORDER — OXYCODONE-ACETAMINOPHEN 5-325 MG PO TABS: 2.0000 | ORAL_TABLET | Freq: Four times a day (QID) | ORAL | Status: DC | PRN

## 2019-10-23 MED ORDER — AMITRIPTYLINE HCL 25 MG PO TABS: 25.0000 mg | ORAL_TABLET | Freq: Every day | ORAL | Status: DC

## 2019-10-23 MED ORDER — BISACODYL 10 MG RE SUPP
10.0000 mg | Freq: Once | RECTAL | Status: AC
  Administered 2019-10-23: 10 mg via RECTAL
  Filled 2019-10-23: qty 1

## 2019-10-23 MED ORDER — DULOXETINE HCL 60 MG PO CPEP: 60.0000 mg | ORAL_CAPSULE | Freq: Every day | ORAL | Status: DC

## 2019-10-23 NOTE — Progress Notes (Signed)
Inpatient Rehabilitation-Admissions Coordinator   [facility] will begin insurance authorization process for possible admit.   Raechel Ache, OTR/L  Rehab Admissions Coordinator  [phone] 10/23/2019 11:18 AM

## 2019-10-23 NOTE — PMR Pre-admission (Signed)
PMR Admission Coordinator Pre-Admission Assessment  Patient: Regina Wolfe is an 64 y.o., female MRN: [id number] DOB: 1956/04/13 Height: 5' 4"  (162.6 cm) Weight: 128.3 kg              Insurance Information HMO: yes    PPO:      PCP:      IPA:      80/20:      OTHER:  PRIMARY: [employer] Medicare      Policy#: [id number]      Subscriber: Patient CM Name: Carlyon Shadow      Phone#: [phone]     Fax#: [phone] Pre-Cert#: [id number]      Employer:  Josem Kaufmann provided by Carlyon Shadow on 2/19 for admit to CIR. Pt is approved from 2/19-2/25 with next review date 2/26. *please put auth number on facesheet for clinical updates. Follow up CM: Darlene (p): [phone]; (F): [phone] Benefits:  Phone #: online     Name: [email or web address], transaction ID #[id number] Eff. Date: 09/05/2019 - 09/03/2020     Deduct: does not have ($0)      Out of Pocket Max: $6,700 ($0 met)      Life Max:  CIR: $279 co-pay/day for days 1-7, $0/day co-pay/day for days 8-90.       SNF: $0 co-pay/day for days 1-20, $184 co-pay/day for days 21-100; limited to 100 days/cal yr Outpatient: $40 co-pay/visit; limited by medical necessity      Home Health: 100% coverage    0% co-insurance; limited by medical necessity review DME: 80% coverage     Co-Pay: 20% co-insurance Providers:  SECONDARY: None      Policy#:       Subscriber:  CM Name:       Phone#:      Fax#:  Pre-Cert#:       Employer:  Benefits:  Phone #:      Name:  Eff. Date:      Deduct:      Out of Pocket Max:      Life Max:  CIR:       SNF:  Outpatient:     Co-Pay:  Home Health:       Co-Pay:  DME:      Co-Pay:   Medicaid Application Date:      Case Manager:  Disability Application Date:       Case Worker:   The "Data Collection Information Summary" for patients in Inpatient Rehabilitation Facilities with attached "Privacy Act [place] Records" was provided and verbally reviewed with: Patient and Family  Emergency Contact Information Contact Information     Name Relation Home Work [place], [place] Sister   [phone]   gravely, Stephan Minister Daughter   [phone]     Current Medical History  Patient Admitting Diagnosis: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     History of Present Illness:  Regina Wolfe is a 64 y.o. 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 She was evaluated in ED out of town and diagnosed with TIA but continued to have issues with disorientation and family presented to [facility] ED on 10/19/2019.  MRI brain showing multiple left frontal and temporal lobe infarcts within left MCA and additional small acute/subacute infract within body of corpus callosum. Echocardiogram showed ejection fraction of 50-55%.  TEE ordered (to  be done 2/18)  to rule out PFO due to embolic stroke of unknown origin --- on DAPT. Therapy evaluations completed and patient with lethargy, difficulty following one step commands and has deficits with ADLs and mobility. CIR recommended due to functional decline.   Complete NIHSS TOTAL: 1 Glasgow Coma Scale Score: 15  Past Medical History  Past Medical History:  Diagnosis Date  . Diabetes mellitus without complication ([place])   . Stroke [facility])     Family History  family history includes Cancer in her sister; Diabetes in her mother; Heart attack in her mother; Leukemia in her sister.  Prior Rehab/Hospitalizations:  Has the patient had prior rehab or hospitalizations prior to admission? No  Has the patient had major surgery during 100 days prior to admission? Yes  Current Medications   Current Facility-Administered Medications:  .  0.9 %  sodium chloride infusion, , Intravenous, Continuous, Croitoru, Mihai, MD, Last Rate: 75 mL/hr at 10/22/19 2114, New  Bag at 10/22/19 2114 .  0.9 %  sodium chloride infusion, , Intravenous, Continuous, [place], PA-C .  acetaminophen (TYLENOL) tablet 650 mg, 650 mg, Oral, Q4H PRN, 650 mg at 10/23/19 2259 **OR** acetaminophen (TYLENOL) 160 MG/5ML solution 650 mg, 650 mg, Per Tube, Q4H PRN **OR** acetaminophen (TYLENOL) suppository 650 mg, 650 mg, Rectal, Q4H PRN, Croitoru, Mihai, MD .  amitriptyline (ELAVIL) tablet 25 mg, 25 mg, Oral, QHS, Croitoru, Mihai, MD, 25 mg at 10/23/19 2120 .  atorvastatin (LIPITOR) tablet 40 mg, 40 mg, Oral, q1800, Croitoru, Mihai, MD, 40 mg at 10/23/19 1739 .  carvedilol (COREG) tablet 25 mg, 25 mg, Oral, BID, Croitoru, Mihai, MD, 25 mg at 10/24/19 0924 .  clopidogrel (PLAVIX) tablet 75 mg, 75 mg, Oral, Daily, Croitoru, Mihai, MD, 75 mg at 10/24/19 0919 .  docusate sodium (COLACE) capsule 200 mg, 200 mg, Oral, Daily, Swayze, Ava, DO, 200 mg at 10/24/19 0919 .  DULoxetine (CYMBALTA) DR capsule 60 mg, 60 mg, Oral, Daily, Croitoru, Mihai, MD, 60 mg at 10/24/19 0919 .  enoxaparin (LOVENOX) injection 60 mg, 60 mg, Subcutaneous, QHS, Croitoru, Mihai, MD, 60 mg at 10/23/19 2120 .  gabapentin (NEURONTIN) capsule 600 mg, 600 mg, Oral, QHS, Croitoru, Mihai, MD, 600 mg at 10/23/19 2120 .  insulin aspart (novoLOG) injection 0-20 Units, 0-20 Units, Subcutaneous, TID WC, Croitoru, Mihai, MD, 11 Units at 10/23/19 1740 .  insulin detemir (LEVEMIR) injection 15 Units, 15 Units, Subcutaneous, QHS, Croitoru, Mihai, MD, 15 Units at 10/23/19 2120 .  oxyCODONE-acetaminophen (PERCOCET/ROXICET) 5-325 MG per tablet 1 tablet, 1 tablet, Oral, Q6H PRN, Swayze, Ava, DO, 1 tablet at 10/24/19 0920 .  oxyCODONE-acetaminophen (PERCOCET/ROXICET) 5-325 MG per tablet 2 tablet, 2 tablet, Oral, Q6H PRN, Swayze, Ava, DO .  senna-docusate (Senokot-S) tablet 1 tablet, 1 tablet, Oral, QHS PRN, Croitoru, Mihai, MD  Patients Current Diet:  Diet Order            Diet - low sodium heart healthy        Diet Carb Modified         Diet Heart Room service appropriate? Yes; Fluid consistency: Thin  Diet effective now              Precautions / Restrictions Precautions Precautions: Fall Precaution Comments: watch HR Restrictions Weight Bearing Restrictions: No   Has the patient had 2 or more falls or a fall with injury in the past year?Yes  Prior Activity Level Community (5-7x/wk): active per report, did not drive due to detached retina causing  blindness; used a 4pt cane for ambulation   Prior Functional Level Prior Function Level of Independence: Needs assistance Gait / Transfers Assistance Needed: ambulated with RW, L side weak since first CVA in 2018 ADL's / Homemaking Assistance Needed: family assisted with ADL's Comments: pt has had some word finding and cognitive issues since first CVA  Self Care: Did the patient need help bathing, dressing, using the toilet or eating?  Independent  Indoor Mobility: Did the patient need assistance with walking from room to room (with or without device)? Independent  Stairs: Did the patient need assistance with internal or external stairs (with or without device)? Independent  Functional Cognition: Did the patient need help planning regular tasks such as shopping or remembering to take medications? Independent  Home Assistive Devices / Equipment Home Assistive Devices/Equipment: CBG Meter, Eyeglasses, Environmental consultant (specify type)(front wheeled walker) Home Equipment: Walker - 2 wheels  Prior Device Use: Indicate devices/aids used by the patient prior to current illness, exacerbation or injury? 4 pt cane  Current Functional Level Cognition  Arousal/Alertness: Lethargic Overall Cognitive Status: Within Functional Limits for tasks assessed Current Attention Level: Focused Orientation Level: (P) Oriented X4 Following Commands: Follows one step commands with increased time Safety/Judgement: Decreased awareness of deficits General Comments: pt demosntrates  perseveration on statements and repeats them for the next set of questions. Pt when asked to name objects names first object correctly then continues to say that same object name    Extremity Assessment (includes Sensation/Coordination)  Upper Extremity Assessment: Generalized weakness, RUE deficits/detail, LUE deficits/detail LUE Deficits / Details: noted to have slight decrease strength to the R UE but very close in functional use  Lower Extremity Assessment: Defer to PT evaluation RLE Deficits / Details: hip flex 2/5, knee ext 3/5, knee flex 3/5 RLE Sensation: decreased light touch LLE Deficits / Details: weaker than R (from previous CVA), hip flex 2-/5, knee ext 3/5, knee flex 3/5    ADLs  Overall ADL's : Needs assistance/impaired Eating/Feeding: Set up, Sitting Grooming: Wash/dry hands, Wash/dry face, Oral care, Sitting, Standing Grooming Details (indicate cue type and reason): completed oral care in standing at sink with min guard for standing balance Upper Body Bathing: Min guard, Sitting Lower Body Bathing: Moderate assistance, Sit to/from stand Upper Body Dressing : Minimal assistance Lower Body Dressing: Total assistance Toilet Transfer: Moderate assistance, +2 for safety/equipment, RW Toilet Transfer Details (indicate cue type and reason): Min assist ambulating to [facility], required Mod assist to stand from [place] and Hygiene: Moderate assistance Functional mobility during ADLs: Minimal assistance, Moderate assistance, +2 for safety/equipment General ADL Comments: Min assist +2 (equipment) ambulating with RW, required up to Mod A with sit > stand and rolling.  HR 99-102 during activity    Mobility  Overal bed mobility: Needs Assistance Bed Mobility: Supine to Sit Rolling: Mod assist, +2 for physical assistance Sidelying to sit: Mod assist, +2 for safety/equipment Supine to sit: Mod assist Sit to supine: +2 for physical assistance, Mod  assist General bed mobility comments: Pt attempted to move to sitting but required assistance for LE advancement.    Transfers  Overall transfer level: Needs assistance Equipment used: Rolling walker (2 wheeled) Transfers: Sit to/from Stand Sit to Stand: Min assist General transfer comment: Min assistance to boost into standing,.  Cues for hand placement to push from seated surface.  Continues to require assistance for eccetric loading back to seated surface.    Ambulation / Gait / Stairs / Wheelchair Mobility  Ambulation/Gait  Ambulation/Gait assistance: Min assist, +2 physical assistance Gait Distance (Feet): 12 Feet(+ 60 ft) Assistive device: Rolling walker (2 wheeled) Gait Pattern/deviations: Step-through pattern, Decreased stride length, Shuffle, Trunk flexed General Gait Details: Pt with difficulty sidestepping and turning with RW.  Stride initially short and shuffled.   Continue to require cues for RW position/safety and increasing stride length.    Posture / Balance Dynamic Sitting Balance Sitting balance - Comments: reliant on L HHA for stability in sitting, posterior lean with near LOB when moving LE's in sitting Balance Overall balance assessment: Needs assistance Sitting-balance support: Bilateral upper extremity supported Sitting balance-Leahy Scale: Good Sitting balance - Comments: reliant on L HHA for stability in sitting, posterior lean with near LOB when moving LE's in sitting Postural control: Posterior lean Standing balance-Leahy Scale: Poor    Special needs/care consideration BiPAP/CPAP: no CPM: no Continuous Drip IV: 0.9% sodium chloride infusion  Dialysis: no        Days: no Life Vest: no Oxygen: no, on RA Special Bed: no Trach Size: no Wound Vac (area): no      Location: no Skin: ecchymosis to bilateral arm, leg; surgical incision to left chest               Bowel mgmt:  continent, last BM 10/23/19 Bladder mgmt: external catheter in place; incontinent;  prefers home briefs and pads Diabetic mgmt: yes Behavioral consideration : no Chemo/radiation : no     Previous Home Environment (from acute therapy documentation) Living Arrangements: Other relatives  Lives With: Other (Comment)(sister and nephew) Available Help at Discharge: Family, Available 24 hours/day Type of Home: [place]: No Additional Comments: per pt's sister that lives in [place], pt lives with her other sister and nephew  Discharge Living Setting Plans for Discharge Living Setting: Patient's home, Lives with (comment)(house; lives with brother and sister) Type of Home at Discharge: House(with basement but does not need to go down there) Discharge Home Layout: One level Discharge Home Access: Stairs to enter Entrance Stairs-Rails: Can reach both Entrance Stairs-Number of Steps: 1 Discharge Bathroom Shower/Tub: Tub/shower unit Discharge Bathroom Toilet: Handicapped height Discharge Bathroom Accessibility: Yes How Accessible: Accessible via walker Does the patient have any problems obtaining your medications?: No  Social/Family/Support Systems Patient Roles: Other (Comment)(close to family; is a retired Therapist, 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 and her daugther Stephan Minister lives nearby and plans to assist as needed Anticipated Caregiver's Contact Information: see above Ability/Limitations of Caregiver: Min A Caregiver Availability: 24/7 Discharge Plan Discussed with Primary Caregiver: Yes Is Caregiver In Agreement with Plan?: Yes Does Caregiver/Family have Issues with Lodging/Transportation while Pt is in Rehab?: No   Goals/Additional Needs Patient/Family Goal for Rehab: PT/OT: Supervision; SLP: NA Expected length of stay: 15-18 days Cultural Considerations: Christian Dietary Needs: heart healthy, thin liquids Equipment Needs: TBD Pt/Family  Agrees to Admission and willing to participate: Yes Program Orientation Provided & Reviewed with Pt/Caregiver Including Roles  & Responsibilities: Yes(pt, daugther [name], and brother Mirna Mires)  Barriers to Discharge: Home environment access/layout  Barriers to Discharge Comments: 1 step to enter home; tub shower   Decrease burden of Care through IP rehab admission: NA   Possible need for SNF placement upon discharge: Not anticipated. Pt has great family support with family who plans to provide 24/7 A at DC. Pt has a good prognosis for further progress through CIR. Anticipate pt will reach a supervision level by DC  from CIR and can safely return home with family.    Patient Condition: This patient's medical and functional status has changed since the consult dated: 10/22/19 in which the Rehabilitation Physician determined and documented that the patient's condition is appropriate for intensive rehabilitative care in an inpatient rehabilitation facility. See "History of Present Illness" (above) for medical update. Functional changes are: improvement in an ability to transfer OOB, initiation of gait with Min A +2 for 12 feet, and an improvement from Mod A/Total A with ADLs to Min/total A. Patient's medical and functional status update has been discussed with the Rehabilitation physician and patient remains appropriate for inpatient rehabilitation. Will admit to inpatient rehab today.  Preadmission Screen Completed By:  Raechel Ache, OT, 10/24/2019 12:08 PM _____________________________________________ _________________________   Discussed status with Dr. Ranell Patrick on 10/24/19 at 12:08PM and received approval for admission today.  Admission Coordinator:  Raechel Ache, time 12:08PM/Date 10/24/19

## 2019-10-23 NOTE — Progress Notes (Signed)
Occupational Therapy Treatment Patient Details Name: Regina Wolfe MRN: [id number] DOB: 01-Nov-1955 Today's Date: 10/23/2019    History of present illness 64 yo female recurrent falls and disorientation. pt found to have several acute/ early subacute infarct on the L frontal and temporal lobe within L MCA, tachycardia,HTN and mild anemia. PMH CAD, CVA 2018 DM2 with neuropathy, HLD, L eye blindnesss secondary to retinal detachment     OT comments  Treatment session with focus on functional mobility and dynamic standing balance during self-care tasks.  Pt required up to mod assist sit > stand from lower surfaces and mod assist for LB bathing.  Pt ambulated with RW with Min-mod assist to sink and completed oral care in standing for endurance.  HR well controlled throughout session. Pt continues to be an excellent candidate for aggressive CIR therapies prior to returning home.  Follow Up Recommendations  CIR    Equipment Recommendations  3 in 1 bedside commode       Precautions / Restrictions Precautions Precautions: Fall Precaution Comments: watch HR Restrictions Weight Bearing Restrictions: No       Mobility Bed Mobility Overal bed mobility: Needs Assistance Bed Mobility: Rolling;Sidelying to Sit Rolling: Mod assist;+2 for physical assistance Sidelying to sit: Mod assist;+2 for safety/equipment       General bed mobility comments: Pt required cues for hand placement to roll for pericare.  assistance to advance B LEs to edge of bed and elevate trunk into a seated position. MOd assistance to scoot forward to edge of bed.  Transfers Overall transfer level: Needs assistance Equipment used: Rolling walker (2 wheeled) Transfers: Sit to/from Stand Sit to Stand: Mod assist;+2 safety/equipment         General transfer comment: Mod assistance to boost into standing with cues for hand placement.  POor eccentric load to seated surfacce    Balance Overall balance assessment: Needs  assistance   Sitting balance-Leahy Scale: Good       Standing balance-Leahy Scale: Poor                             ADL either performed or assessed with clinical judgement   ADL Overall ADL's : Needs assistance/impaired     Grooming: Wash/dry hands;Wash/dry face;Oral care;Sitting;Standing Grooming Details (indicate cue type and reason): completed oral care in standing at sink with min guard for standing balance Upper Body Bathing: Min guard;Sitting   Lower Body Bathing: Moderate assistance;Sit to/from stand   Upper Body Dressing : Minimal assistance   Lower Body Dressing: Total assistance   Toilet Transfer: Moderate assistance;+2 for safety/equipment;RW Toilet Transfer Details (indicate cue type and reason): Min assist ambulating to [facility], required Mod assist to stand from [place] and Hygiene: Moderate assistance       Functional mobility during ADLs: Minimal assistance;Moderate assistance;+2 for safety/equipment General ADL Comments: Min assist +2 (equipment) ambulating with RW, required up to Mod A with sit > stand and rolling.  HR 99-102 during activity     Vision Baseline Vision/History: Legally blind Additional Comments: residual L eye blindness; decreased R peripheral vision          Cognition Arousal/Alertness: Lethargic Behavior During Therapy: WFL for tasks assessed/performed Overall Cognitive Status: Within Functional Limits for tasks assessed  General Comments HR 99-102 during activity    Pertinent Vitals/ Pain       Pain Assessment: Faces Faces Pain Scale: Hurts little more Pain Location: LE's below knee to touch, diabetic neuropathy. Pain Descriptors / Indicators: Tender Pain Intervention(s): Monitored during session;Repositioned         Frequency  Min 2X/week        Progress Toward Goals  OT Goals(current goals can now be found in  the care plan section)  Progress towards OT goals: Progressing toward goals  Acute Rehab OT Goals Patient Stated Goal: to be able to return home OT Goal Formulation: With patient Time For Goal Achievement: 11/04/19 Potential to Achieve Goals: Good  Plan Discharge plan remains appropriate    Co-evaluation    PT/OT/SLP Co-Evaluation/Treatment: Yes Reason for Co-Treatment: Complexity of the patient's impairments (multi-system involvement) PT goals addressed during session: Mobility/safety with mobility OT goals addressed during session: ADL's and self-care      AM-PAC OT "6 Clicks" Daily Activity     Outcome Measure   Help from another person eating meals?: A Little Help from another person taking care of personal grooming?: A Little Help from another person toileting, which includes using toliet, bedpan, or urinal?: A Lot Help from another person bathing (including washing, rinsing, drying)?: A Little Help from another person to put on and taking off regular upper body clothing?: A Little Help from another person to put on and taking off regular lower body clothing?: A Lot 6 Click Score: 16    End of Session Equipment Utilized During Treatment: Gait belt;Rolling walker  OT Visit Diagnosis: Unsteadiness on feet (R26.81);Muscle weakness (generalized) (M62.81)   Activity Tolerance Patient tolerated treatment well   Patient Left in chair;with call bell/phone within reach;with chair alarm set;with family/visitor present   Nurse Communication Mobility status        Time: [id number] OT Time Calculation (min): 26 min  Charges: OT General Charges $OT Visit: 1 Visit OT Treatments $Self Care/Home Management : 8-22 mins    [place], [place] 10/23/2019, 11:04 AM

## 2019-10-23 NOTE — Progress Notes (Signed)
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  Patient has been having short-term memory issues.  Patient started having difficulty finding words and seemed disoriented.  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 EP cardiology has been consulted for this reason. TTE demonstrated no embolic source. TEE was performed this afternoon. Result is pending. LINQ monitor to look for atrial fibrillation has been placed by cardiology to evaluate the patient for atrial fibrillation.  The patient has been evaluated by PM&R for inpatient rehab. Insurance authorization in underway.   Consultants  . Neurology . Electrophysiology  Procedures  . TTE . TEE . LINQ monitor placement  Antibiotics   Anti-infectives (From admission, onward)   None     Subjective  The patient is resting comfortably. She states that she has not had a BM since admission.  Objective   Vitals:  Vitals:   10/23/19 0800 10/23/19 1133  BP: (!) 142/74 128/71  Pulse: 100 92  Resp: 16 17  Temp: 98.5 F (36.9 C) 98.1 F (36.7 C)  SpO2: 98% 95%   Exam:  Constitutional:  . The patient is awake, alert, and oriented x 3. No acute distress. Respiratory:  . No increased work of breathing. . No wheezes, rales, or rhonchi . No tactile fremitus Cardiovascular:  . Regular rate and rhythm . No murmurs, ectopy, or gallups. . No lateral PMI. No thrills. Abdomen:  . Abdomen is soft, non-tender, non-distended . No hernias, masses, or organomegaly . Normoactive bowel sounds.  Musculoskeletal:  . No cyanosis, clubbing, or edema Skin:  . No rashes, lesions, ulcers . palpation of  skin: no induration or nodules Neurologic:  . CN 2-12 intact . Sensation all 4 extremities intact Psychiatric:  . Mental status o Mood, affect appropriate o Orientation to person, place, time  . judgment and insight appear intact  I have personally reviewed the following:   Today's Data  . Vitals, BMP, CBC, Glucoses  Micro Data  . Blood cultures x 2: No growth  Imaging  . MRI Brain . CT Brain  Cardiology Data  . TTE . TEE - pending  Scheduled Meds: . amitriptyline  25 mg Oral QHS  . atorvastatin  40 mg Oral q1800  . bisacodyl  10 mg Rectal Once  . carvedilol  25 mg Oral BID  . clopidogrel  75 mg Oral Daily  . docusate sodium  200 mg Oral Daily  . DULoxetine  60 mg Oral Daily  . enoxaparin (LOVENOX) injection  60 mg Subcutaneous QHS  . gabapentin  600 mg Oral QHS  . insulin aspart  0-20 Units Subcutaneous TID WC  . insulin detemir  15 Units Subcutaneous QHS   Continuous Infusions: . sodium chloride 75 mL/hr at 10/22/19 2114  . sodium chloride      Principal Problem:   CVA (cerebral vascular accident) ([name]) Active Problems:   Tachycardia   Essential hypertension   Anemia   Insulin dependent type 2 diabetes mellitus ([place])   Peripheral neuropathy   CAD (coronary artery disease)   History of CVA (cerebrovascular accident)   Diabetic peripheral neuropathy ([place])   LOS: 4 days   A & P  Left MCA stroke: MRI showed several  subcentimeter acute/early subacute infarcts within the left frontal and temporal lobes there is additional small acute/early subacute infarct within the body of corpus callosum.  CT angiogram head and neck showed no large or medium vessel intracranial occlusion, sclerotic disease at both carotid bifurcation but no stenosis.  Neurology is following.  Continue Plavix, atorvastatin.  Echocardiogram showed EF 50 to 55%, indeterminate diastolic filling. No embolic source seen on TTE. PE consulted. TEE is pending. If TEE is unrevealing EP has recommended  LINQ monitor to evaluate for atrial fibrillation.  Diabetes mellitus type 2-continue Levemir 50 units subcu daily, sliding scale insulin with NovoLog. Glucoses have run from 160 - 290 in the last 24 hours.   Hypertension: Blood pressures normotensive. Losartan on hold for permissive hypertension.  Blood pressure stable.  History of CAD-stable: The patient has been continued on Coreg, Plavix, and Lipitor. Continue telemetry monitoring. No chest pain.  I have seen and examined this patient myself. I have spent 32 minutes in her evaluation and care.  DVT prophylaxis: SCD's CODE STATUS: Full Code Family Communication: None at bedside Disposition: From Home. Likely to CIR vs Home at discharge. Barriers to discharge: Insurance authorization  Winford Hehn, DO [facility] Direct contact: see [email or web address]  7PM-7AM contact night coverage as above 10/23/2019, 3:14 PM  LOS: 3 days

## 2019-10-23 NOTE — Progress Notes (Signed)
Physical Therapy Treatment Patient Details Name: Regina Wolfe MRN: [id number] DOB: 09/05/1955 Today's Date: 10/23/2019    History of Present Illness 64 yo female recurrent falls and disorientation. pt found to have several acute/ early subacute infarct on the L frontal and temporal lobe within L MCA, tachycardia,HTN and mild anemia. PMH CAD, CVA 2018 DM2 with neuropathy, HLD, L eye blindnesss secondary to retinal detachment      PT Comments    Pt performed gt training and functional mobility this session with great carryover.  She is requiring min to mod assistance and very motivated to make functional gains.  HR well controlled this session.  Pt would be an excellent candidate for aggressive CIR therapies before returning home.     Follow Up Recommendations  CIR     Equipment Recommendations  None recommended by PT    Recommendations for Other Services       Precautions / Restrictions Precautions Precautions: Fall Precaution Comments: watch HR Restrictions Weight Bearing Restrictions: No    Mobility  Bed Mobility Overal bed mobility: Needs Assistance Bed Mobility: Rolling;Sidelying to Sit Rolling: Mod assist;+2 for physical assistance Sidelying to sit: Mod assist;+2 for safety/equipment       General bed mobility comments: Pt required cues for hand placement to roll for pericare.  assistance to advance B LEs to edge of bed and elevate trunk into a seated position. MOd assistance to scoot forward to edge of bed.  Transfers Overall transfer level: Needs assistance Equipment used: Rolling walker (2 wheeled) Transfers: Sit to/from Stand Sit to Stand: Mod assist;+2 safety/equipment         General transfer comment: Mod assistance to boost into standing with cues for hand placement.  POor eccentric load to seated surfacce  Ambulation/Gait Ambulation/Gait assistance: Min assist;+2 physical assistance Gait Distance (Feet): 12 Feet(x2) Assistive device: Rolling walker  (2 wheeled) Gait Pattern/deviations: Step-through pattern;Decreased stride length;Shuffle;Trunk flexed     General Gait Details: Cues for upper trunk control and progressing length of steps.  Assistance for turns with RW.   Stairs             Wheelchair Mobility    Modified Rankin (Stroke Patients Only)       Balance Overall balance assessment: Needs assistance   Sitting balance-Leahy Scale: Good       Standing balance-Leahy Scale: Poor                              Cognition Arousal/Alertness: Lethargic Behavior During Therapy: WFL for tasks assessed/performed Overall Cognitive Status: Within Functional Limits for tasks assessed                                        Exercises      General Comments        Pertinent Vitals/Pain Pain Assessment: Faces Faces Pain Scale: Hurts little more Pain Location: LE's below knee to touch, diabetic neuropathy. Pain Descriptors / Indicators: Tender Pain Intervention(s): Monitored during session;Repositioned    Home Living                      Prior Function            PT Goals (current goals can now be found in the care plan section) Acute Rehab PT Goals Patient Stated Goal: to be able to return home  Potential to Achieve Goals: Fair Progress towards PT goals: Progressing toward goals    Frequency    Min 4X/week      PT Plan Current plan remains appropriate    Co-evaluation PT/OT/SLP Co-Evaluation/Treatment: Yes Reason for Co-Treatment: Complexity of the patient's impairments (multi-system involvement) PT goals addressed during session: Mobility/safety with mobility OT goals addressed during session: ADL's and self-care      AM-PAC PT "6 Clicks" Mobility   Outcome Measure  Help needed turning from your back to your side while in a flat bed without using bedrails?: A Lot Help needed moving from lying on your back to sitting on the side of a flat bed without  using bedrails?: A Lot Help needed moving to and from a bed to a chair (including a wheelchair)?: A Lot Help needed standing up from a chair using your arms (e.g., wheelchair or bedside chair)?: A Lot Help needed to walk in hospital room?: A Little Help needed climbing 3-5 steps with a railing? : A Little 6 Click Score: 14    End of Session Equipment Utilized During Treatment: Gait belt Activity Tolerance: Treatment limited secondary to medical complications (Comment) Patient left: with call bell/phone within reach;with family/visitor present;in chair;with chair alarm set Nurse Communication: Mobility status PT Visit Diagnosis: Muscle weakness (generalized) (M62.81);Other symptoms and signs involving the nervous system (R29.898);Difficulty in walking, not elsewhere classified (R26.2)     Time: [id number] PT Time Calculation (min) (ACUTE ONLY): 30 min  Charges:  $Gait Training: 8-22 mins                     Erasmo Leventhal , PTA Acute Rehabilitation Services Pager [phone] Office [phone]     Dafney Farler Eli Hose 10/23/2019, 11:03 AM

## 2019-10-24 ENCOUNTER — Inpatient Hospital Stay (HOSPITAL_COMMUNITY)
Admission: RE | Admit: 2019-10-24 | Discharge: 2019-10-30 | DRG: 057 | Disposition: A | Discharge status: Home Health | Payer: Medicare HMO | Source: Intra-hospital | Attending: Physical Medicine & Rehabilitation | Admitting: Physical Medicine & Rehabilitation

## 2019-10-24 ENCOUNTER — Encounter (HOSPITAL_COMMUNITY): Payer: Self-pay | Admitting: Physical Medicine & Rehabilitation

## 2019-10-24 ENCOUNTER — Other Ambulatory Visit: Payer: Self-pay

## 2019-10-24 DIAGNOSIS — I10 Essential (primary) hypertension: Secondary | ICD-10-CM | POA: Diagnosis present

## 2019-10-24 DIAGNOSIS — Z833 Family history of diabetes mellitus: Secondary | ICD-10-CM | POA: Diagnosis not present

## 2019-10-24 DIAGNOSIS — Z95818 Presence of other cardiac implants and grafts: Secondary | ICD-10-CM

## 2019-10-24 DIAGNOSIS — Z794 Long term (current) use of insulin: Secondary | ICD-10-CM

## 2019-10-24 DIAGNOSIS — Z7902 Long term (current) use of antithrombotics/antiplatelets: Secondary | ICD-10-CM | POA: Diagnosis not present

## 2019-10-24 DIAGNOSIS — Z8249 Family history of ischemic heart disease and other diseases of the circulatory system: Secondary | ICD-10-CM

## 2019-10-24 DIAGNOSIS — Z7982 Long term (current) use of aspirin: Secondary | ICD-10-CM | POA: Diagnosis not present

## 2019-10-24 DIAGNOSIS — E1140 Type 2 diabetes mellitus with diabetic neuropathy, unspecified: Secondary | ICD-10-CM | POA: Diagnosis present

## 2019-10-24 DIAGNOSIS — I69354 Hemiplegia and hemiparesis following cerebral infarction affecting left non-dominant side: Secondary | ICD-10-CM | POA: Diagnosis present

## 2019-10-24 DIAGNOSIS — M792 Neuralgia and neuritis, unspecified: Secondary | ICD-10-CM | POA: Diagnosis not present

## 2019-10-24 DIAGNOSIS — Z87891 Personal history of nicotine dependence: Secondary | ICD-10-CM

## 2019-10-24 DIAGNOSIS — D649 Anemia, unspecified: Secondary | ICD-10-CM | POA: Diagnosis present

## 2019-10-24 DIAGNOSIS — I69320 Aphasia following cerebral infarction: Secondary | ICD-10-CM | POA: Diagnosis not present

## 2019-10-24 DIAGNOSIS — Z806 Family history of leukemia: Secondary | ICD-10-CM | POA: Diagnosis not present

## 2019-10-24 DIAGNOSIS — N390 Urinary tract infection, site not specified: Secondary | ICD-10-CM

## 2019-10-24 DIAGNOSIS — I63512 Cerebral infarction due to unspecified occlusion or stenosis of left middle cerebral artery: Secondary | ICD-10-CM | POA: Diagnosis not present

## 2019-10-24 DIAGNOSIS — Z91040 Latex allergy status: Secondary | ICD-10-CM | POA: Diagnosis not present

## 2019-10-24 DIAGNOSIS — E46 Unspecified protein-calorie malnutrition: Secondary | ICD-10-CM

## 2019-10-24 DIAGNOSIS — E8809 Other disorders of plasma-protein metabolism, not elsewhere classified: Secondary | ICD-10-CM | POA: Diagnosis not present

## 2019-10-24 DIAGNOSIS — E1142 Type 2 diabetes mellitus with diabetic polyneuropathy: Secondary | ICD-10-CM | POA: Diagnosis not present

## 2019-10-24 DIAGNOSIS — E119 Type 2 diabetes mellitus without complications: Secondary | ICD-10-CM

## 2019-10-24 DIAGNOSIS — R7309 Other abnormal glucose: Secondary | ICD-10-CM | POA: Diagnosis not present

## 2019-10-24 DIAGNOSIS — B9620 Unspecified Escherichia coli [E. coli] as the cause of diseases classified elsewhere: Secondary | ICD-10-CM

## 2019-10-24 DIAGNOSIS — Z885 Allergy status to narcotic agent status: Secondary | ICD-10-CM | POA: Diagnosis not present

## 2019-10-24 LAB — GLUCOSE, CAPILLARY
Glucose-Capillary: 90 mg/dL (ref 70–99)
Glucose-Capillary: 106 mg/dL — ABNORMAL HIGH (ref 70–99)
Glucose-Capillary: 377 mg/dL — ABNORMAL HIGH (ref 70–99)
Glucose-Capillary: 219 mg/dL — ABNORMAL HIGH (ref 70–99)
Glucose-Capillary: 138 mg/dL — ABNORMAL HIGH (ref 70–99)

## 2019-10-24 MED ORDER — ASPIRIN EC 81 MG PO TBEC: 81.0000 mg | DELAYED_RELEASE_TABLET | Freq: Every day | ORAL | 0 refills | Status: DC

## 2019-10-24 MED ORDER — DIPHENHYDRAMINE HCL 12.5 MG/5ML PO ELIX: 12.5000 mg | ORAL_SOLUTION | Freq: Four times a day (QID) | ORAL | Status: DC | PRN

## 2019-10-24 MED ORDER — ENOXAPARIN SODIUM 80 MG/0.8ML ~~LOC~~ SOLN: 65.0000 mg | Freq: Every day | SUBCUTANEOUS | Status: DC

## 2019-10-24 MED ORDER — PROCHLORPERAZINE MALEATE 5 MG PO TABS: 5.0000 mg | ORAL_TABLET | Freq: Four times a day (QID) | ORAL | Status: DC | PRN

## 2019-10-24 MED ORDER — ATORVASTATIN CALCIUM 40 MG PO TABS
40.0000 mg | ORAL_TABLET | Freq: Every day | ORAL | Status: DC
  Administered 2019-10-25 – 2019-10-29 (×5): 40 mg via ORAL
  Filled 2019-10-24 (×5): qty 1

## 2019-10-24 MED ORDER — ENOXAPARIN SODIUM 60 MG/0.6ML ~~LOC~~ SOLN: 60.0000 mg | SUBCUTANEOUS | Status: DC

## 2019-10-24 MED ORDER — OXYCODONE-ACETAMINOPHEN 5-325 MG PO TABS: 1.0000 | ORAL_TABLET | Freq: Four times a day (QID) | ORAL | 0 refills | Status: DC | PRN

## 2019-10-24 MED ORDER — METFORMIN HCL 500 MG PO TABS
500.0000 mg | ORAL_TABLET | Freq: Two times a day (BID) | ORAL | Status: DC
  Administered 2019-10-24 – 2019-10-25 (×2): 500 mg via ORAL
  Filled 2019-10-24 (×2): qty 1

## 2019-10-24 MED ORDER — GLYBURIDE-METFORMIN 5-500 MG PO TABS: 2.0000 | ORAL_TABLET | Freq: Two times a day (BID) | ORAL | Status: DC

## 2019-10-24 MED ORDER — POLYETHYLENE GLYCOL 3350 17 G PO PACK: 17.0000 g | PACK | Freq: Every day | ORAL | Status: DC | PRN

## 2019-10-24 MED ORDER — ENOXAPARIN SODIUM 80 MG/0.8ML ~~LOC~~ SOLN
65.0000 mg | Freq: Every day | SUBCUTANEOUS | Status: DC
  Administered 2019-10-24 – 2019-10-29 (×6): 65 mg via SUBCUTANEOUS
  Filled 2019-10-24 (×6): qty 0.65

## 2019-10-24 MED ORDER — BISACODYL 10 MG RE SUPP: 10.0000 mg | Freq: Every day | RECTAL | Status: DC | PRN

## 2019-10-24 MED ORDER — FLEET ENEMA 7-19 GM/118ML RE ENEM: 1.0000 | ENEMA | Freq: Once | RECTAL | Status: DC | PRN

## 2019-10-24 MED ORDER — OXYCODONE-ACETAMINOPHEN 5-325 MG PO TABS
1.0000 | ORAL_TABLET | Freq: Four times a day (QID) | ORAL | Status: DC | PRN
  Administered 2019-10-24 – 2019-10-29 (×9): 1 via ORAL
  Filled 2019-10-24 (×9): qty 1

## 2019-10-24 MED ORDER — TRAZODONE HCL 50 MG PO TABS
25.0000 mg | ORAL_TABLET | Freq: Every evening | ORAL | Status: DC | PRN
  Administered 2019-10-25 – 2019-10-29 (×6): 50 mg via ORAL
  Filled 2019-10-24 (×6): qty 1

## 2019-10-24 MED ORDER — AMITRIPTYLINE HCL 10 MG PO TABS
10.0000 mg | ORAL_TABLET | Freq: Every day | ORAL | Status: DC
  Administered 2019-10-24 – 2019-10-27 (×4): 10 mg via ORAL
  Filled 2019-10-24 (×4): qty 1

## 2019-10-24 MED ORDER — SENNOSIDES-DOCUSATE SODIUM 8.6-50 MG PO TABS
2.0000 | ORAL_TABLET | Freq: Every day | ORAL | Status: DC
  Administered 2019-10-25 – 2019-10-29 (×4): 2 via ORAL
  Filled 2019-10-24 (×6): qty 2

## 2019-10-24 MED ORDER — ACETAMINOPHEN 325 MG PO TABS
325.0000 mg | ORAL_TABLET | ORAL | Status: DC | PRN
  Administered 2019-10-25 – 2019-10-26 (×3): 650 mg via ORAL
  Filled 2019-10-24 (×4): qty 2

## 2019-10-24 MED ORDER — PROCHLORPERAZINE EDISYLATE 10 MG/2ML IJ SOLN: 5.0000 mg | Freq: Four times a day (QID) | INTRAMUSCULAR | Status: DC | PRN

## 2019-10-24 MED ORDER — PROCHLORPERAZINE 25 MG RE SUPP: 12.5000 mg | Freq: Four times a day (QID) | RECTAL | Status: DC | PRN

## 2019-10-24 MED ORDER — GABAPENTIN 300 MG PO CAPS
600.0000 mg | ORAL_CAPSULE | Freq: Every day | ORAL | Status: DC
  Administered 2019-10-24 – 2019-10-29 (×6): 600 mg via ORAL
  Filled 2019-10-24 (×6): qty 2

## 2019-10-24 MED ORDER — ALUM & MAG HYDROXIDE-SIMETH 200-200-20 MG/5ML PO SUSP: 30.0000 mL | ORAL | Status: DC | PRN

## 2019-10-24 MED ORDER — GLYBURIDE 5 MG PO TABS
5.0000 mg | ORAL_TABLET | Freq: Two times a day (BID) | ORAL | Status: DC
  Administered 2019-10-24 – 2019-10-25 (×2): 5 mg via ORAL
  Filled 2019-10-24 (×2): qty 1

## 2019-10-24 MED ORDER — INSULIN ASPART 100 UNIT/ML ~~LOC~~ SOLN
0.0000 [IU] | Freq: Three times a day (TID) | SUBCUTANEOUS | Status: DC
  Administered 2019-10-25: 09:00:00 3 [IU] via SUBCUTANEOUS
  Administered 2019-10-25: 12:00:00 4 [IU] via SUBCUTANEOUS
  Administered 2019-10-25: 17:00:00 7 [IU] via SUBCUTANEOUS
  Administered 2019-10-26: 17:00:00 4 [IU] via SUBCUTANEOUS
  Administered 2019-10-26 (×2): 3 [IU] via SUBCUTANEOUS
  Administered 2019-10-27: 13:00:00 4 [IU] via SUBCUTANEOUS
  Administered 2019-10-27: 18:00:00 3 [IU] via SUBCUTANEOUS
  Administered 2019-10-28: 18:00:00 4 [IU] via SUBCUTANEOUS
  Administered 2019-10-28: 13:00:00 7 [IU] via SUBCUTANEOUS
  Administered 2019-10-29: 13:00:00 11 [IU] via SUBCUTANEOUS

## 2019-10-24 MED ORDER — DULOXETINE HCL 60 MG PO CPEP
60.0000 mg | ORAL_CAPSULE | Freq: Every day | ORAL | Status: DC
  Administered 2019-10-25 – 2019-10-30 (×6): 60 mg via ORAL
  Filled 2019-10-24 (×6): qty 1

## 2019-10-24 MED ORDER — CLOPIDOGREL BISULFATE 75 MG PO TABS
75.0000 mg | ORAL_TABLET | Freq: Every day | ORAL | Status: DC
  Administered 2019-10-25 – 2019-10-30 (×6): 75 mg via ORAL
  Filled 2019-10-24 (×6): qty 1

## 2019-10-24 MED ORDER — CARVEDILOL 25 MG PO TABS
25.0000 mg | ORAL_TABLET | Freq: Two times a day (BID) | ORAL | Status: DC
  Administered 2019-10-24 – 2019-10-30 (×12): 25 mg via ORAL
  Filled 2019-10-24 (×12): qty 1

## 2019-10-24 MED ORDER — GUAIFENESIN-DM 100-10 MG/5ML PO SYRP
5.0000 mL | ORAL_SOLUTION | Freq: Four times a day (QID) | ORAL | Status: DC | PRN
  Administered 2019-10-26: 10 mL via ORAL
  Filled 2019-10-24: qty 10

## 2019-10-24 NOTE — Progress Notes (Signed)
  Speech Language Pathology Treatment: Cognitive-Linquistic  Patient Details Name: Regina Wolfe MRN: [id number] DOB: Feb 20, 1956 Today's Date: 10/24/2019 Time: 7262-0355 SLP Time Calculation (min) (ACUTE ONLY): 12 min  Assessment / Plan / Recommendation Clinical Impression  Patient received in room for skilled ST. Patient alert, oriented x4 and able to engage in simple conversation. In confrontation naming task, patient naming 10/10 objects. However, she endorses word-finding difficulty in more complex conversational scenarios. ST provided education and verbal handout re: word finding strategies such as circumlocution, gestures, pantomime, using association, saying the first sounds. Patient verbalized understanding and ability to read/understand the handout. Patient able to follow multi-step (up to 3 step trialed) verbal commands this date and presents with much improved mentation. ST to follow briefly as per POC.   HPI HPI: 64 y.o.femalewith medical history significant forCAD, CVA in 2018on Plavix, insulin-dependent type 2 diabetes with neuropathy, venous insufficiency, hyperlipidemia, and left eye blindness secondary to retinal detachment who presents with concerns of recurrent falls and disorientation. MRI brain (10/19/19) showed several acute/early subacture infarcts within the left frontal and temporal lobes within the left MCA vascular territory. Patient's sister, Regina Wolfe, reports pt lives in [place] with another sister and nephew.       SLP Plan  All goals met;Goals updated       Recommendations                   Plan: All goals met;Goals updated       GO                Regina Wolfe 10/24/2019, 9:40 AM  Regina Wolfe, M.Ed., [facility] Acute Rehabilitation [phone]: Acute Rehab office [phone] - pager

## 2019-10-24 NOTE — Progress Notes (Signed)
Patient ID: Regina Wolfe, female   DOB: 26-Aug-1956, 64 y.o.   MRN: [id number] Patient arrived to [phone] via wheelchair with RN, NT, and patient belongings. Patient oriented to room, rehab process, schedule, fall prevention plan, safety plan, and health resource notebook. Patient in bed with 3 siderails up and bed alarm on.

## 2019-10-24 NOTE — Discharge Summary (Addendum)
Physician Discharge Summary  Kazlynn Hibberd [id number] DOB: Mar 10, 1956 DOA: 10/19/2019  PCP: Patient, No Pcp Per  Admit date: 10/19/2019 Discharge date: 10/24/2019  Recommendations for Outpatient Follow-up:  1. Pt to have loop recorder placed at discharge from acute inpatient stay. 2. She is being discharged to inpatient rehab. 3. Follow up with Dr. Tilden Dome in 6 weeks. 4. Follow up with cardiology as directed. 5. Follow up with PCP in 7-10 days.  Follow-up Information    [facility].   Specialty: Cardiology Why: 11/04/2019 @ 8:30AM Contact information: [address] [place] [phone]         Discharge Diagnoses: Principal diagnosis is #1 1. Left MCA stroke. 2. DM II 3. Hypertension 4. History of CAD  Discharge Condition: Fair  Disposition: CIR  Diet recommendation: Heart healthy, carbohydrate modified  Filed Weights   10/20/19 2115  Weight: 128.3 kg   History of present illness:  Shulanda Schloemer is a 64 y.o. 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 Patient's daughter at bedside provides most of the history as she has mild short-term memory issues recalling her symptoms. About 2 weeks ago, daughter noticed that she would repeatedly fall off the bed due to weakness and started to have bowel/urinary incontinent in the bed.  When asked why the patient simply stated that she "cannot remember" to use the bathroom.  Then about a week ago she also began to have trouble word finding and seemed disoriented.  Patient normally is able to do all of her normal ADLs without assistance.  She normally ambulates with a cane. They then took her to an ER in [place] and was just  told that she had a TIA and discharged home.  However they felt that she continued to be disoriented and decided to bring her here for better care.  Patient denies having any symptoms and just that she cannot recall what happened this past week.  Currently does not have any complaints of chest pain, palpitation or shortness of breath.  No headache.  No nausea, vomiting or diarrhea abdominal pain.  Has had good appetite.  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 Patient has been having short-term memory issues. Patient started having difficulty finding words and seemed disoriented.  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 EP cardiology has been consulted for this reason. TTE demonstrated no embolic source. TEE was performed this afternoon. It demonstrates TEE 50-55%. Low normal function of LV. Normal wall motion of LV. Normal Right ventricular systolic function. They were unable to evaluate for diastolic dysfunction. There was no left atrial or left atrial appendage thrombus visualized. Bubble reveals no intra arterial shunt. The patient had a loop recorder placed by cardiology on 10/22/2019.  The patient has been evaluated by PM&R for inpatient rehab. She has been cleared for acceptance into CIR.   Today's assessment: S: The patient is resting comfortably. O: Vitals:  Vitals:   10/24/19 0337 10/24/19 0924  BP: 139/76 (!) 168/89  Pulse: 82 92  Resp: 18 (!) 24  Temp: (!) 97.3 F (  36.3 C)   SpO2: 97% 96%   Exam:  Constitutional:   The patient is awake, alert, and oriented x 3. No acute distress. Respiratory:   No increased work of breathing.  No wheezes, rales, or rhonchi  No tactile fremitus Cardiovascular:   Regular rate  and rhythm  No murmurs, ectopy, or gallups.  No lateral PMI. No thrills. Abdomen:   Abdomen is soft, non-tender, non-distended  No hernias, masses, or organomegaly  Normoactive bowel sounds.  Musculoskeletal:   No cyanosis, clubbing, or edema Skin:   No rashes, lesions, ulcers  palpation of skin: no induration or nodules Neurologic:   CN 2-12 intact  Sensation all 4 extremities intact Psychiatric:   Mental status ? Mood, affect appropriate ? Orientation to person, place, time   judgment and insight appear intact Discharge Instructions  Discharge Instructions    Activity as tolerated - No restrictions   Complete by: As directed    Call MD for:   Complete by: As directed    New neurological deficit   Call MD for:  difficulty breathing, headache or visual disturbances   Complete by: As directed    Call MD for:  persistant dizziness or light-headedness   Complete by: As directed    Diet - low sodium heart healthy   Complete by: As directed    Diet Carb Modified   Complete by: As directed    Discharge instructions   Complete by: As directed    Pt to have loop recorder placed at discharge from acute inpatient stay. She is being discharged to inpatient rehab. Follow up with Dr. Tilden Dome in 6 weeks. Follow up with cardiology as directed. Follow up with PCP in 7-10 days.   Increase activity slowly   Complete by: As directed      Allergies as of 10/24/2019      Reactions   Latex Other (See Comments)   "Hands turned black with extreme swelling"   Morphine And Related Hives      Medication List    STOP taking these medications   losartan 100 MG tablet Commonly known as: COZAAR   oxyCODONE-acetaminophen 10-325 MG tablet Commonly known as: PERCOCET Replaced by: oxyCODONE-acetaminophen 5-325 MG tablet     TAKE these medications   amitriptyline 50 MG tablet Commonly known as: ELAVIL Take 25 mg by mouth at bedtime.   aspirin EC 81 MG tablet Take 1 tablet  (81 mg total) by mouth daily for 21 days.   atorvastatin 40 MG tablet Commonly known as: LIPITOR Take 40 mg by mouth at bedtime.   carvedilol 25 MG tablet Commonly known as: COREG Take 25 mg by mouth 2 (two) times daily.   clopidogrel 75 MG tablet Commonly known as: PLAVIX Take 75 mg by mouth daily.   DULoxetine 60 MG capsule Commonly known as: CYMBALTA Take 60 mg by mouth daily.   gabapentin 600 MG tablet Commonly known as: NEURONTIN Take 600 mg by mouth at bedtime.   glyBURIDE-metformin 5-500 MG tablet Commonly known as: GLUCOVANCE Take 2 tablets by mouth 2 (two) times daily.   insulin detemir 100 UNIT/ML injection Commonly known as: LEVEMIR Inject 15 Units into the skin every evening.   insulin lispro 100 UNIT/ML injection Commonly known as: HUMALOG Inject 15-30 Units into the skin 3 (three) times daily with meals. Inject 15 units if blood sugar is below 350mg /dL; Inject 30 units if blood sugar is above 350mg /dL   oxyCODONE-acetaminophen 5-325 MG tablet Commonly known as: PERCOCET/ROXICET Take 1  tablet by mouth every 6 (six) hours as needed for moderate pain (pain, neuropathy). Replaces: oxyCODONE-acetaminophen 10-325 MG tablet   vitamin B-12 100 MCG tablet Commonly known as: CYANOCOBALAMIN Take 100 mcg by mouth daily.   Vitamin D (Ergocalciferol) 1.25 MG (50000 UNIT) Caps capsule Commonly known as: DRISDOL Take 50,000 Units by mouth once a week.      Allergies  Allergen Reactions  . Latex Other (See Comments)    "Hands turned black with extreme swelling"  . Morphine And Related Hives    The results of significant diagnostics from this hospitalization (including imaging, microbiology, ancillary and laboratory) are listed below for reference.    Significant Diagnostic Studies: CT ANGIO HEAD W OR WO CONTRAST  Result Date: 10/20/2019 CLINICAL DATA:  Confusion. Incontinence. Left MCA strokes seen by MRI. EXAM: CT ANGIOGRAPHY HEAD AND NECK TECHNIQUE:  Multidetector CT imaging of the head and neck was performed using the standard protocol during bolus administration of intravenous contrast. Multiplanar CT image reconstructions and MIPs were obtained to evaluate the vascular anatomy. Carotid stenosis measurements (when applicable) are obtained utilizing NASCET criteria, using the distal internal carotid diameter as the denominator. CONTRAST:  145mL OMNIPAQUE IOHEXOL 350 MG/ML SOLN COMPARISON:  MRI 10/19/2019 FINDINGS: CT HEAD FINDINGS Brain: Old infarction in the pons. No focal cerebellar finding. Cerebral hemispheres show old lacunar infarctions of the thalami, right basal ganglia and affecting the cerebral hemispheric white matter. No large vessel territory infarction. No mass lesion, hemorrhage, hydrocephalus or extra-axial collection. Vascular: There is atherosclerotic calcification of the major vessels at the base of the brain. Skull: Negative Sinuses: Clear Orbits: Normal Review of the MIP images confirms the above findings CTA NECK FINDINGS Aortic arch: Normal Right carotid system: Common carotid artery widely patent the bifurcation. Calcified plaque at the carotid bifurcation and ICA bulb but no stenosis. Cervical ICA widely patent. Left carotid system: Common carotid artery widely patent to the bifurcation. Calcified plaque at the carotid bifurcation and ICA bulb but no stenosis. Cervical ICA widely patent. Vertebral arteries: Both vertebral artery origins widely patent. Both vertebral arteries widely patent through the cervical region to the foramen magnum. Skeleton: Mild cervical spondylosis. Other neck: No soft tissue mass or lymphadenopathy. Upper chest: Normal Review of the MIP images confirms the above findings CTA HEAD FINDINGS Anterior circulation: Both internal carotid arteries are patent through the skull base and siphon regions. There is calcified plaque in both carotid siphon regions but no stenosis greater than 50% suspected. The anterior and  middle cerebral vessels are patent. No large or medium vessel occlusion is identified. No correctable proximal stenosis. No aneurysm or vascular malformation. Posterior circulation: Both vertebral arteries are patent at the foramen magnum. There is calcified plaque in both vertebral V4 segments but no stenosis greater than 30%. Both vertebral arteries reach the basilar. No basilar stenosis. Posterior circulation branch vessels are patent. Venous sinuses: Patent and normal. Anatomic variants: None significant. Review of the MIP images confirms the above findings IMPRESSION: No large or medium vessel intracranial occlusion. Atherosclerotic disease at both carotid bifurcations but no stenosis or significant irregularity. Atherosclerotic change in both carotid siphon regions and both vertebral artery V4 segments, but no flow limiting stenosis. Electronically Signed   By: Nelson Chimes M.D.   On: 10/20/2019 00:11   CT ANGIO NECK W OR WO CONTRAST  Result Date: 10/20/2019 CLINICAL DATA:  Confusion. Incontinence. Left MCA strokes seen by MRI. EXAM: CT ANGIOGRAPHY HEAD AND NECK TECHNIQUE: Multidetector CT imaging of the head and  neck was performed using the standard protocol during bolus administration of intravenous contrast. Multiplanar CT image reconstructions and MIPs were obtained to evaluate the vascular anatomy. Carotid stenosis measurements (when applicable) are obtained utilizing NASCET criteria, using the distal internal carotid diameter as the denominator. CONTRAST:  155mL OMNIPAQUE IOHEXOL 350 MG/ML SOLN COMPARISON:  MRI 10/19/2019 FINDINGS: CT HEAD FINDINGS Brain: Old infarction in the pons. No focal cerebellar finding. Cerebral hemispheres show old lacunar infarctions of the thalami, right basal ganglia and affecting the cerebral hemispheric white matter. No large vessel territory infarction. No mass lesion, hemorrhage, hydrocephalus or extra-axial collection. Vascular: There is atherosclerotic calcification  of the major vessels at the base of the brain. Skull: Negative Sinuses: Clear Orbits: Normal Review of the MIP images confirms the above findings CTA NECK FINDINGS Aortic arch: Normal Right carotid system: Common carotid artery widely patent the bifurcation. Calcified plaque at the carotid bifurcation and ICA bulb but no stenosis. Cervical ICA widely patent. Left carotid system: Common carotid artery widely patent to the bifurcation. Calcified plaque at the carotid bifurcation and ICA bulb but no stenosis. Cervical ICA widely patent. Vertebral arteries: Both vertebral artery origins widely patent. Both vertebral arteries widely patent through the cervical region to the foramen magnum. Skeleton: Mild cervical spondylosis. Other neck: No soft tissue mass or lymphadenopathy. Upper chest: Normal Review of the MIP images confirms the above findings CTA HEAD FINDINGS Anterior circulation: Both internal carotid arteries are patent through the skull base and siphon regions. There is calcified plaque in both carotid siphon regions but no stenosis greater than 50% suspected. The anterior and middle cerebral vessels are patent. No large or medium vessel occlusion is identified. No correctable proximal stenosis. No aneurysm or vascular malformation. Posterior circulation: Both vertebral arteries are patent at the foramen magnum. There is calcified plaque in both vertebral V4 segments but no stenosis greater than 30%. Both vertebral arteries reach the basilar. No basilar stenosis. Posterior circulation branch vessels are patent. Venous sinuses: Patent and normal. Anatomic variants: None significant. Review of the MIP images confirms the above findings IMPRESSION: No large or medium vessel intracranial occlusion. Atherosclerotic disease at both carotid bifurcations but no stenosis or significant irregularity. Atherosclerotic change in both carotid siphon regions and both vertebral artery V4 segments, but no flow limiting  stenosis. Electronically Signed   By: Nelson Chimes M.D.   On: 10/20/2019 00:11   MR BRAIN WO CONTRAST  Result Date: 10/19/2019 CLINICAL DATA:  Focal neuro deficit, greater than 6 hours, stroke suspected. Additional history provided: Confusion, incontinence, history of stroke in 2018 EXAM: MRI HEAD WITHOUT CONTRAST TECHNIQUE: Multiplanar, multiecho pulse sequences of the brain and surrounding structures were obtained without intravenous contrast. COMPARISON:  No pertinent prior studies available for comparison. FINDINGS: Brain: Intermittently motion degraded examination. Most notably there is moderate motion degradation of the axial T2 FLAIR and coronal T2 weighted sequences. There are multiple subcentimeter acute/early subacute infarcts within the left MCA vascular territory within the anterior left frontal lobe subcortical white matter (series 11, image 42) and a cortical/subcortical focus within the left frontal lobe motor strip (series 11, image 43), as well as within the anterior left temporal lobe (series 11, image 35). There is an additional subcentimeter acute/early subacute infarct within the body of the corpus callosum (series 11, image 41). Background advanced patchy and confluent T2/FLAIR hyperintensity within the cerebral white matter and pons is nonspecific, but consistent with chronic small vessel ischemic disease. There are also numerous chronic lacunar infarcts within the  bilateral cerebral white matter, basal ganglia, thalami and within the pons. There is also a focus of encephalomalacia within the anterior body/genu of the corpus callosum which may be post ischemic. No evidence of intracranial mass. No midline shift or extra-axial fluid collection. No chronic intracranial blood products. Moderate generalized parenchymal atrophy. Vascular: Flow voids maintained within the proximal large arterial vessels. Skull and upper cervical spine: No focal marrow lesion. Incompletely assessed upper cervical  spondylosis. This includes a C3-C4 disc bulge partially effacing the ventral thecal sac and possibly contacting the ventral spinal cord. Sinuses/Orbits: Visualized orbits demonstrate no acute abnormality. No significant paranasal sinus disease or mastoid effusion. IMPRESSION: Intermittently motion degraded examination. Several subcentimeter acute/early subacute infarcts within the left frontal and temporal lobes within the left MCA vascular territory. There is an additional small acute/early subacute infarct within the body of the corpus callosum. Correlate for an embolic phenomenon, possibly central or within the left carotid system. Background advanced chronic small vessel ischemic disease with multiple chronic lacunar infarcts as described. Moderate generalized parenchymal atrophy. Electronically Signed   By: Kellie Simmering DO   On: 10/19/2019 16:53   EP PPM/ICD IMPLANT  Result Date: 10/22/2019 SURGEON:  Allegra Lai, MD   PREPROCEDURE DIAGNOSIS:  Cryptogenic Stroke   POSTPROCEDURE DIAGNOSIS:  Cryptogenic Stroke    PROCEDURES:  1. Implantable loop recorder implantation   INTRODUCTION:  Marelly Reske is a 64 y.o. female with a history of unexplained stroke who presents today for implantable loop implantation.  The patient has had a cryptogenic stroke.  Despite an extensive workup by neurology, no reversible causes have been identified.  she has worn telemetry during which she did not have arrhythmias.  There is significant concern for possible atrial fibrillation as the cause for the patients stroke.  The patient therefore presents today for implantable loop implantation.   DESCRIPTION OF PROCEDURE:  Informed written consent was obtained, and the patient was brought to the electrophysiology lab in a fasting state.  The patient required no sedation for the procedure today.  Mapping over the patient's chest was performed by the EP lab staff to identify the area where electrograms were most prominent for ILR  recording.  This area was found to be the left parasternal region over the 3rd-4th intercostal space. The patients left chest was therefore prepped and draped in the usual sterile fashion by the EP lab staff. The skin overlying the left parasternal region was infiltrated with lidocaine for local analgesia.  A 0.5-cm incision was made over the left parasternal region over the 3rd intercostal space.  A subcutaneous ILR pocket was fashioned using a combination of sharp and blunt dissection.  A Medtronic Reveal [place] model [id number] implantable loop recorder was then placed into the pocket  R waves were very prominent and measured 0.26mV. EBL<1 ml.  Steri- Strips and a sterile dressing were then applied.  There were no early apparent complications.   CONCLUSIONS:  1. Successful implantation of a Medtronic Reveal LINQ implantable loop recorder for cryptogenic stroke  2. No early apparent complications.   DG Chest Port 1V same Day  Result Date: 10/21/2019 CLINICAL DATA:  Fever. EXAM: PORTABLE CHEST 1 VIEW COMPARISON:  No recent. FINDINGS: Heart size stable. Low lung volumes. Mild bibasilar atelectasis. Mild infiltrates can not be excluded. No pleural effusion or pneumothorax. Old left sixth rib fracture. No acute bony abnormality. Degenerative change thoracic spine. IMPRESSION: Low lung volumes with mild bibasilar atelectasis. Mild basilar infiltrates cannot be excluded. Electronically Signed  By: [place]   On: 10/21/2019 13:47   ECHOCARDIOGRAM COMPLETE  Result Date: 10/20/2019    ECHOCARDIOGRAM REPORT   Patient Name:   LAURANNE TURAN Date of Exam: 10/20/2019 Medical Rec #:  [id number]     Height:       64.0 in Accession #:    [id number]    Weight:       289.0 lb Date of Birth:  [birth year]    BSA:          2.29 m Patient Age:    89 years      BP:           158/83 mmHg Patient Gender: F             HR:           111 bpm. Exam Location:  Inpatient Procedure: 2D Echo, Color Doppler, Cardiac Doppler  and Intracardiac            Opacification Agent Indications:    Stroke i163.9  History:        Patient has no prior history of Echocardiogram examinations.                 CAD; Risk Factors:Hypertension and Diabetes.  Sonographer:    Raquel Sarna Senior RDCS Referring Phys: [id number] [place] T [facility]  Sonographer Comments: Technically difficult study due to poor echo windows. IMPRESSIONS  1. Left ventricular ejection fraction, by estimation, is 50 to 55%. The left ventricle has low normal function. The left ventricle has no regional wall motion abnormalities. Indeterminate diastolic filling due to E-A fusion.  2. Right ventricular systolic function is normal. The right ventricular size is normal. Tricuspid regurgitation signal is inadequate for assessing PA pressure.  3. The mitral valve is degenerative. No evidence of mitral valve regurgitation. No evidence of mitral stenosis.  4. The aortic valve is tricuspid. Aortic valve regurgitation is not visualized. No aortic stenosis is present.  5. The inferior vena cava is normal in size with greater than 50% respiratory variability, suggesting right atrial pressure of 3 mmHg. Comparison(s): No prior Echocardiogram. Conclusion(s)/Recomendation(s): No intracardiac source of embolism detected on this transthoracic study. A transesophageal echocardiogram is recommended to exclude cardiac source of embolism if clinically indicated. FINDINGS  Left Ventricle: Left ventricular ejection fraction, by estimation, is 50 to 55%. The left ventricle has low normal function. The left ventricle has no regional wall motion abnormalities. Definity contrast agent was given IV to delineate the left ventricular endocardial borders. The left ventricular internal cavity size was normal in size. There is no left ventricular hypertrophy. Indeterminate diastolic filling due to E-A fusion. Right Ventricle: The right ventricular size is normal. No increase in right ventricular wall thickness. Right ventricular  systolic function is normal. Tricuspid regurgitation signal is inadequate for assessing PA pressure. Left Atrium: Left atrial size was normal in size. Right Atrium: Right atrial size was normal in size. Pericardium: A small pericardial effusion is present. The pericardial effusion is circumferential. Mitral Valve: The mitral valve is degenerative in appearance. Mild to moderate mitral annular calcification. No evidence of mitral valve regurgitation. No evidence of mitral valve stenosis. Tricuspid Valve: The tricuspid valve is grossly normal. Tricuspid valve regurgitation is not demonstrated. Aortic Valve: The aortic valve is tricuspid. Aortic valve regurgitation is not visualized. No aortic stenosis is present. Pulmonic Valve: The pulmonic valve was grossly normal. Pulmonic valve regurgitation is not visualized. No evidence of pulmonic stenosis. Aorta: The aortic root and ascending aorta are structurally  normal, with no evidence of dilitation. Venous: The inferior vena cava is normal in size with greater than 50% respiratory variability, suggesting right atrial pressure of 3 mmHg. IAS/Shunts: No atrial level shunt detected by color flow Doppler.  LEFT VENTRICLE PLAX 2D LVIDd:         4.80 cm LVIDs:         3.60 cm LV PW:         0.90 cm LV IVS:        0.90 cm LVOT diam:     2.00 cm LV SV:         33.30 ml LV SV Index:   21.12 LVOT Area:     3.14 cm  RIGHT VENTRICLE RV S prime:     12.90 cm/s TAPSE (M-mode): 2.1 cm LEFT ATRIUM             Index LA diam:        3.60 cm 1.57 cm/m LA Vol (A2C):   49.8 ml 21.77 ml/m LA Vol (A4C):   67.2 ml 29.38 ml/m LA Biplane Vol: 62.7 ml 27.41 ml/m  AORTIC VALVE LVOT Vmax:   62.70 cm/s LVOT Vmean:  46.200 cm/s LVOT VTI:    0.106 m  AORTA Ao Root diam: 2.90 cm Ao Asc diam:  3.20 cm  SHUNTS Systemic VTI:  0.11 m Systemic Diam: 2.00 cm Eleonore Chiquito MD Electronically signed by Eleonore Chiquito MD Signature Date/Time: 10/20/2019/3:39:46 PM    Final    ECHO TEE  Result Date:  10/22/2019    TRANSESOPHOGEAL ECHO REPORT   Patient Name:   Jannifer Franklin Date of Exam: 10/22/2019 Medical Rec #:  [id number]     Height:       64.0 in Accession #:    [id number]    Weight:       282.8 lb Date of Birth:  07-Apr-1956    BSA:          2.27 m Patient Age:    38 years      BP:           94/43 mmHg Patient Gender: F             HR:           96 bpm. Exam Location:  Inpatient Procedure: Transesophageal Echo Indications:     Stroke  History:         Patient has prior history of Echocardiogram examinations, most                  recent 10/20/2019. CAD; Risk Factors:Hypertension and Diabetes.  Sonographer:     Vikki Ports [place] Referring Phys:  [id number] [place] Diagnosing Phys: Sanda Klein MD PROCEDURE: The transesophogeal probe was passed without difficulty through the esophogus of the patient. Sedation performed by different physician. The patient was monitored while under deep sedation. Anesthestetic sedation was provided intravenously by Anesthesiology: 20mg  of Propofol. The patient's vital signs; including heart rate, blood pressure, and oxygen saturation; remained stable throughout the procedure. The patient developed no complications during the procedure. IMPRESSIONS  1. Left ventricular ejection fraction, by estimation, is 50 to 55%. The left ventricle has low normal function. The left ventricle has no regional wall motion abnormalities. Left ventricular diastolic function could not be evaluated.  2. Right ventricular systolic function is normal. The right ventricular size is normal.  3. No left atrial/left atrial appendage thrombus was detected.  4. The mitral valve is normal in structure and function. No  evidence of mitral valve regurgitation. No evidence of mitral stenosis.  5. The aortic valve is normal in structure and function. Aortic valve regurgitation is not visualized. No aortic stenosis is present.  6. The inferior vena cava is normal in size with greater than 50% respiratory  variability, suggesting right atrial pressure of 3 mmHg.  7. Agitated saline contrast bubble study was negative, with no evidence of any interatrial shunt. Conclusion(s)/Recomendation(s): No LA/LAA thrombus identified. Negative bubble study for interatrial shunt. No intracardiac source of embolism detected on this on this transesophageal echocardiogram. FINDINGS  Left Ventricle: Left ventricular ejection fraction, by estimation, is 50 to 55%. The left ventricle has low normal function. The left ventricle has no regional wall motion abnormalities. The left ventricular internal cavity size was normal in size. There is no left ventricular hypertrophy. Left ventricular diastolic function could not be evaluated. Right Ventricle: The right ventricular size is normal. No increase in right ventricular wall thickness. Right ventricular systolic function is normal. Left Atrium: Left atrial size was normal in size. No left atrial/left atrial appendage thrombus was detected. Right Atrium: Right atrial size was normal in size. Prominent Chiari network. Pericardium: Trivial pericardial effusion is present. The pericardial effusion is circumferential. Mitral Valve: The mitral valve is normal in structure and function. Normal mobility of the mitral valve leaflets. No evidence of mitral valve regurgitation. No evidence of mitral valve stenosis. Tricuspid Valve: The tricuspid valve is normal in structure. Tricuspid valve regurgitation is not demonstrated. No evidence of tricuspid stenosis. Aortic Valve: The aortic valve is normal in structure and function. Aortic valve regurgitation is not visualized. No aortic stenosis is present. Pulmonic Valve: The pulmonic valve was normal in structure. Pulmonic valve regurgitation is not visualized. No evidence of pulmonic stenosis. Aorta: The aortic root is normal in size and structure and the aortic root, ascending aorta and aortic arch are all structurally normal, with no evidence of  dilitation or obstruction. Venous: The inferior vena cava is normal in size with greater than 50% respiratory variability, suggesting right atrial pressure of 3 mmHg. IAS/Shunts: No atrial level shunt detected by color flow Doppler. Agitated saline contrast bubble study was negative, with no evidence of any interatrial shunt. Sanda Klein MD Electronically signed by Sanda Klein MD Signature Date/Time: 10/22/2019/3:40:36 PM    Final     Microbiology: Recent Results (from the past 240 hour(s))  SARS CORONAVIRUS 2 (TAT 6-24 HRS) Nasopharyngeal Nasopharyngeal Swab     Status: None   Collection Time: 10/19/19  6:15 PM   Specimen: Nasopharyngeal Swab  Result Value Ref Range Status   SARS Coronavirus 2 NEGATIVE NEGATIVE Final    Comment: (NOTE) SARS-CoV-2 target nucleic acids are NOT DETECTED. The SARS-CoV-2 RNA is generally detectable in upper and lower respiratory specimens during the acute phase of infection. Negative results do not preclude SARS-CoV-2 infection, do not rule out co-infections with other pathogens, and should not be used as the sole basis for treatment or other patient management decisions. Negative results must be combined with clinical observations, patient history, and epidemiological information. The expected result is Negative. Fact Sheet for Patients: SugarRoll.be Fact Sheet for Healthcare Providers: https://www.woods-mathews.com/ This test is not yet approved or cleared by the [place] FDA and  has been authorized for detection and/or diagnosis of SARS-CoV-2 by FDA under an Emergency Use Authorization (EUA). This EUA will remain  in effect (meaning this test can be used) for the duration of the COVID-19 declaration under Section 56 4(b)(1) of the Act,  21 U.S.C. section 360bbb-3(b)(1), unless the authorization is terminated or revoked sooner. Performed at [facility], [place] [address], [place], [place] [address]    Culture, blood (Routine X 2) w Reflex to ID Panel     Status: None (Preliminary result)   Collection Time: 10/21/19  1:29 PM   Specimen: BLOOD RIGHT HAND  Result Value Ref Range Status   Specimen Description BLOOD RIGHT HAND  Final   Special Requests   Final    BOTTLES DRAWN AEROBIC ONLY Blood Culture adequate volume   Culture   Final    NO GROWTH 3 DAYS Performed at [facility], [place] [address], [place], [place] [address]    Report Status PENDING  Incomplete  Culture, blood (Routine X 2) w Reflex to ID Panel     Status: None (Preliminary result)   Collection Time: 10/21/19  1:29 PM   Specimen: BLOOD LEFT HAND  Result Value Ref Range Status   Specimen Description BLOOD LEFT HAND  Final   Special Requests   Final    BOTTLES DRAWN AEROBIC ONLY Blood Culture adequate volume   Culture   Final    NO GROWTH 3 DAYS Performed at [facility], [place] [address], [place],  [address]    Report Status PENDING  Incomplete     Labs: Basic Metabolic Panel: Recent Labs  Lab 10/19/19 1436 10/22/19 0205 10/23/19 0358  NA 137 141 137  K 4.0 3.7 3.7  CL 100 108 105  CO2 27 25 24   GLUCOSE 371* 161* 196*  BUN 12 8 13   CREATININE 0.72 0.62 0.68  CALCIUM 8.4* 8.4* 8.1*   Liver Function Tests: Recent Labs  Lab 10/19/19 1436 10/23/19 0358  AST 17 32  ALT 17 26  ALKPHOS 60 49  BILITOT 0.5 0.4  PROT 7.5 5.8*  ALBUMIN 3.3* 2.3*   No results for input(s): LIPASE, AMYLASE in the last 168 hours. No results for input(s): AMMONIA in the last 168 hours. CBC: Recent Labs  Lab 10/19/19 1436 10/22/19 0205  WBC 8.0 6.2  NEUTROABS 5.0  --   HGB 11.5* 10.9*  HCT 37.6 34.2*  MCV 82.6 79.7*  PLT 381 333   Cardiac Enzymes: No results for input(s): CKTOTAL, CKMB, CKMBINDEX, TROPONINI in the last 168 hours. BNP: BNP (last 3 results) No results for input(s): BNP in the last 8760 hours.  ProBNP (last 3 results) No results for input(s): PROBNP in the last 8760  hours.  CBG: Recent Labs  Lab 10/23/19 1132 10/23/19 1605 10/23/19 2107 10/24/19 0603 10/24/19 0819  GLUCAP 276* 257* 190* 90 106*    Principal Problem:   CVA (cerebral vascular accident) ([place]) Active Problems:   Tachycardia   Essential hypertension   Anemia   Insulin dependent type 2 diabetes mellitus ([place])   Peripheral neuropathy   CAD (coronary artery disease)   History of CVA (cerebrovascular accident)   Diabetic peripheral neuropathy ([place])   Time coordinating discharge: 38 minutes  Signed:        Khyler Urda, DO [facility]  10/24/2019, 11:17 AM

## 2019-10-24 NOTE — Progress Notes (Signed)
Regina Ribas, MD  Physician  Physical Medicine and Rehabilitation  PMR Pre-admission  Signed  Date of Service:  10/23/2019 12:45 PM      Related encounter: ED to Hosp-Admission (Discharged) from 10/19/2019 in [facility]      Signed        PMR Admission Coordinator Pre-Admission Assessment   Patient: Regina Wolfe is an 64 y.o., female MRN: [id number] DOB: 12/24/1955 Height: 5' 4"  (162.6 cm) Weight: 128.3 kg                                                                                                                                                  Insurance Information HMO: yes    PPO:      PCP:      IPA:      80/20:      OTHER:  PRIMARY: [employer] Medicare      Policy#: [id number]      Subscriber: Patient CM Name: Regina Wolfe      Phone#: [phone]     Fax#: [phone] Pre-Cert#: [id number]      Employer:  Regina Wolfe provided by Regina Wolfe on 2/19 for admit to CIR. Pt is approved from 2/19-2/25 with next review date 2/26. *please put auth number on facesheet for clinical updates. Follow up CM: Darlene (p): [phone]; (F): [phone] Benefits:  Phone #: online     Name: [email or web address], transaction ID #[id number] Eff. Date: 09/05/2019 - 09/03/2020     Deduct: does not have ($0)      Out of Pocket Max: $6,700 ($0 met)      Life Max:  CIR: $279 co-pay/day for days 1-7, $0/day co-pay/day for days 8-90.       SNF: $0 co-pay/day for days 1-20, $184 co-pay/day for days 21-100; limited to 100 days/cal yr Outpatient: $40 co-pay/visit; limited by medical necessity      Home Health: 100% coverage    0% co-insurance; limited by medical necessity review DME: 80% coverage     Co-Pay: 20% co-insurance Providers:  SECONDARY: None      Policy#:       Subscriber:  CM Name:       Phone#:      Fax#:  Pre-Cert#:       Employer:  Benefits:  Phone #:      Name:  Eff. Date:      Deduct:      Out of Pocket Max:      Life Max:  CIR:       SNF:  Outpatient:     Co-Pay:  Home  Health:       Co-Pay:  DME:      Co-Pay:    Medicaid Application Date:      Case Manager:  Disability Application Date:       Case Worker:    The "Data  Collection Information Summary" for patients in Inpatient Rehabilitation Facilities with attached "Privacy Act [place] Records" was provided and verbally reviewed with: Patient and Family   Emergency Contact Information         Contact Information     Name Relation Home Work Regina Wolfe, Regina Wolfe Sister     [phone]    Wolfe, Regina Collin     [phone]       Current Medical History  Patient Admitting Diagnosis: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      History of Present Illness:  Chevi Lim is a 64 y.o. 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 She was evaluated in ED out of town and diagnosed with TIA but continued to have issues with disorientation and family presented to [facility] ED on 10/19/2019.  MRI brain showing multiple left frontal and temporal lobe infarcts within left MCA and additional small acute/subacute infract within body of corpus callosum. Echocardiogram showed ejection fraction of 50-55%.  TEE ordered (to be done 2/18)  to rule out PFO due to embolic stroke of unknown origin --- on DAPT. Therapy evaluations completed and patient with lethargy, difficulty following one step commands and has deficits with ADLs and mobility. CIR recommended due to functional decline.    Complete NIHSS TOTAL: 1 Glasgow Coma Scale Score: 15   Past Medical History      Past Medical History:  Diagnosis Date  . Diabetes mellitus without complication ([place])    . Stroke [facility] [place])        Family History  family history includes Cancer in her sister; Diabetes in her  mother; Heart attack in her mother; Leukemia in her sister.   Prior Rehab/Hospitalizations:  Has the patient had prior rehab or hospitalizations prior to admission? No   Has the patient had major surgery during 100 days prior to admission? Yes   Current Medications    Current Facility-Administered Medications:  .  0.9 %  sodium chloride infusion, , Intravenous, Continuous, Croitoru, Mihai, MD, Last Rate: 75 mL/hr at 10/22/19 2114, New Bag at 10/22/19 2114 .  0.9 %  sodium chloride infusion, , Intravenous, Continuous, [place], PA-C .  acetaminophen (TYLENOL) tablet 650 mg, 650 mg, Oral, Q4H PRN, 650 mg at 10/23/19 2259 **OR** acetaminophen (TYLENOL) 160 MG/5ML solution 650 mg, 650 mg, Per Tube, Q4H PRN **OR** acetaminophen (TYLENOL) suppository 650 mg, 650 mg, Rectal, Q4H PRN, Croitoru, Mihai, MD .  amitriptyline (ELAVIL) tablet 25 mg, 25 mg, Oral, QHS, Croitoru, Mihai, MD, 25 mg at 10/23/19 2120 .  atorvastatin (LIPITOR) tablet 40 mg, 40 mg, Oral, q1800, Croitoru, Mihai, MD, 40 mg at 10/23/19 1739 .  carvedilol (COREG) tablet 25 mg, 25 mg, Oral, BID, Croitoru, Mihai, MD, 25 mg at 10/24/19 0924 .  clopidogrel (PLAVIX) tablet 75 mg, 75 mg, Oral, Daily, Croitoru, Mihai, MD, 75 mg at 10/24/19 0919 .  docusate sodium (COLACE) capsule 200 mg, 200 mg, Oral, Daily, Wolfe, Ava, DO, 200 mg at 10/24/19 0919 .  DULoxetine (CYMBALTA) DR capsule 60 mg, 60 mg, Oral, Daily, Croitoru, Mihai, MD, 60 mg at 10/24/19 0919 .  enoxaparin (LOVENOX) injection 60 mg, 60 mg, Subcutaneous, QHS, Croitoru, Mihai, MD, 60 mg at 10/23/19 2120 .  gabapentin (NEURONTIN) capsule 600 mg, 600 mg, Oral, QHS,  Croitoru, Mihai, MD, 600 mg at 10/23/19 2120 .  insulin aspart (novoLOG) injection 0-20 Units, 0-20 Units, Subcutaneous, TID WC, Croitoru, Mihai, MD, 11 Units at 10/23/19 1740 .  insulin detemir (LEVEMIR) injection 15 Units, 15 Units, Subcutaneous, QHS, Croitoru, Mihai, MD, 15 Units at 10/23/19 2120 .   oxyCODONE-acetaminophen (PERCOCET/ROXICET) 5-325 MG per tablet 1 tablet, 1 tablet, Oral, Q6H PRN, Wolfe, Ava, DO, 1 tablet at 10/24/19 0920 .  oxyCODONE-acetaminophen (PERCOCET/ROXICET) 5-325 MG per tablet 2 tablet, 2 tablet, Oral, Q6H PRN, Wolfe, Ava, DO .  senna-docusate (Senokot-S) tablet 1 tablet, 1 tablet, Oral, QHS PRN, Croitoru, Mihai, MD   Patients Current Diet:     Diet Order                      Diet - low sodium heart healthy           Diet Carb Modified           Diet Heart Room service appropriate? Yes; Fluid consistency: Thin  Diet effective now                   Precautions / Restrictions Precautions Precautions: Fall Precaution Comments: watch HR Restrictions Weight Bearing Restrictions: No    Has the patient had 2 or more falls or a fall with injury in the past year?Yes   Prior Activity Level Community (5-7x/wk): active per report, did not drive due to detached retina causing blindness; used a 4pt cane for ambulation    Prior Functional Level Prior Function Level of Independence: Needs assistance Gait / Transfers Assistance Needed: ambulated with RW, L side weak since first CVA in 2018 ADL's / Homemaking Assistance Needed: family assisted with ADL's Comments: pt has had some word finding and cognitive issues since first CVA   Self Care: Did the patient need help bathing, dressing, using the toilet or eating?  Independent   Indoor Mobility: Did the patient need assistance with walking from room to room (with or without device)? Independent   Stairs: Did the patient need assistance with internal or external stairs (with or without device)? Independent   Functional Cognition: Did the patient need help planning regular tasks such as shopping or remembering to take medications? Independent   Home Assistive Devices / Equipment Home Assistive Devices/Equipment: CBG Meter, Eyeglasses, Environmental consultant (specify type)(front wheeled walker) Home Equipment: Walker - 2  wheels   Prior Device Use: Indicate devices/aids used by the patient prior to current illness, exacerbation or injury? 4 pt cane   Current Functional Level Cognition   Arousal/Alertness: Lethargic Overall Cognitive Status: Within Functional Limits for tasks assessed Current Attention Level: Focused Orientation Level: (P) Oriented X4 Following Commands: Follows one step commands with increased time Safety/Judgement: Decreased awareness of deficits General Comments: pt demosntrates perseveration on statements and repeats them for the next set of questions. Pt when asked to name objects names first object correctly then continues to say that same object name    Extremity Assessment (includes Sensation/Coordination)   Upper Extremity Assessment: Generalized weakness, RUE deficits/detail, LUE deficits/detail LUE Deficits / Details: noted to have slight decrease strength to the R UE but very close in functional use  Lower Extremity Assessment: Defer to PT evaluation RLE Deficits / Details: hip flex 2/5, knee ext 3/5, knee flex 3/5 RLE Sensation: decreased light touch LLE Deficits / Details: weaker than R (from previous CVA), hip flex 2-/5, knee ext 3/5, knee flex 3/5     ADLs   Overall ADL's :  Needs assistance/impaired Eating/Feeding: Set up, Sitting Grooming: Wash/dry hands, Wash/dry face, Oral care, Sitting, Standing Grooming Details (indicate cue type and reason): completed oral care in standing at sink with min guard for standing balance Upper Body Bathing: Min guard, Sitting Lower Body Bathing: Moderate assistance, Sit to/from stand Upper Body Dressing : Minimal assistance Lower Body Dressing: Total assistance Toilet Transfer: Moderate assistance, +2 for safety/equipment, RW Toilet Transfer Details (indicate cue type and reason): Min assist ambulating to [facility], required Mod assist to stand from [place] and Hygiene: Moderate assistance Functional mobility  during ADLs: Minimal assistance, Moderate assistance, +2 for safety/equipment General ADL Comments: Min assist +2 (equipment) ambulating with RW, required up to Mod A with sit > stand and rolling.  HR 99-102 during activity     Mobility   Overal bed mobility: Needs Assistance Bed Mobility: Supine to Sit Rolling: Mod assist, +2 for physical assistance Sidelying to sit: Mod assist, +2 for safety/equipment Supine to sit: Mod assist Sit to supine: +2 for physical assistance, Mod assist General bed mobility comments: Pt attempted to move to sitting but required assistance for LE advancement.     Transfers   Overall transfer level: Needs assistance Equipment used: Rolling walker (2 wheeled) Transfers: Sit to/from Stand Sit to Stand: Min assist General transfer comment: Min assistance to boost into standing,.  Cues for hand placement to push from seated surface.  Continues to require assistance for eccetric loading back to seated surface.     Ambulation / Gait / Stairs / Wheelchair Mobility   Ambulation/Gait Ambulation/Gait assistance: Min assist, +2 physical assistance Gait Distance (Feet): 12 Feet(+ 60 ft) Assistive device: Rolling walker (2 wheeled) Gait Pattern/deviations: Step-through pattern, Decreased stride length, Shuffle, Trunk flexed General Gait Details: Pt with difficulty sidestepping and turning with RW.  Stride initially short and shuffled.   Continue to require cues for RW position/safety and increasing stride length.     Posture / Balance Dynamic Sitting Balance Sitting balance - Comments: reliant on L HHA for stability in sitting, posterior lean with near LOB when moving LE's in sitting Balance Overall balance assessment: Needs assistance Sitting-balance support: Bilateral upper extremity supported Sitting balance-Leahy Scale: Good Sitting balance - Comments: reliant on L HHA for stability in sitting, posterior lean with near LOB when moving LE's in sitting Postural  control: Posterior lean Standing balance-Leahy Scale: Poor     Special needs/care consideration BiPAP/CPAP: no CPM: no Continuous Drip IV: 0.9% sodium chloride infusion  Dialysis: no        Days: no Life Vest: no Oxygen: no, on RA Special Bed: no Trach Size: no Wound Vac (area): no      Location: no Skin: ecchymosis to bilateral arm, leg; surgical incision to left chest               Bowel mgmt:  continent, last BM 10/23/19 Bladder mgmt: external catheter in place; incontinent; prefers home briefs and pads Diabetic mgmt: yes Behavioral consideration : no Chemo/radiation : no        Previous Home Environment (from acute therapy documentation) Living Arrangements: Other relatives  Lives With: Other (Comment)(sister and nephew) Available Help at Discharge: Family, Available 24 hours/day Type of Home: [place]: No Additional Comments: per pt's sister that lives in [place], pt lives with her other sister and nephew   Discharge Living Setting Plans for Discharge Living Setting: Patient's home, Lives with (comment)(house; lives with brother and sister) Type of Home at Discharge: House(with basement  but does not need to go down there) Discharge Home Layout: One level Discharge Home Access: Stairs to enter Entrance Stairs-Rails: Can reach both Entrance Stairs-Number of Steps: 1 Discharge Bathroom Shower/Tub: Tub/shower unit Discharge Bathroom Toilet: Handicapped height Discharge Bathroom Accessibility: Yes How Accessible: Accessible via walker Does the patient have any problems obtaining your medications?: No   Social/Family/Support Systems Patient Roles: Other (Comment)(close to family; is a retired Therapist, 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 and her daugther Regina Wolfe lives nearby and plans to assist as needed Anticipated Caregiver's Contact Information: see  above Ability/Limitations of Caregiver: Min A Caregiver Availability: 24/7 Discharge Plan Discussed with Primary Caregiver: Yes Is Caregiver In Agreement with Plan?: Yes Does Caregiver/Family have Issues with Lodging/Transportation while Pt is in Rehab?: No     Goals/Additional Needs Patient/Family Goal for Rehab: PT/OT: Supervision; SLP: NA Expected length of stay: 15-18 days Cultural Considerations: Christian Dietary Needs: heart healthy, thin liquids Equipment Needs: TBD Pt/Family Agrees to Admission and willing to participate: Yes Program Orientation Provided & Reviewed with Pt/Caregiver Including Roles  & Responsibilities: Yes(pt, daugther Regina Wolfe, and brother Regina Wolfe)  Barriers to Discharge: Home environment access/layout  Barriers to Discharge Comments: 1 step to enter home; tub shower     Decrease burden of Care through IP rehab admission: NA     Possible need for SNF placement upon discharge: Not anticipated. Pt has great family support with family who plans to provide 24/7 A at DC. Pt has a good prognosis for further progress through CIR. Anticipate pt will reach a supervision level by DC from CIR and can safely return home with family.      Patient Condition: This patient's medical and functional status has changed since the consult dated: 10/22/19 in which the Rehabilitation Physician determined and documented that the patient's condition is appropriate for intensive rehabilitative care in an inpatient rehabilitation facility. See "History of Present Illness" (above) for medical update. Functional changes are: improvement in an ability to transfer OOB, initiation of gait with Min A +2 for 12 feet, and an improvement from Mod A/Total A with ADLs to Min/total A. Patient's medical and functional status update has been discussed with the Rehabilitation physician and patient remains appropriate for inpatient rehabilitation. Will admit to inpatient rehab today.   Preadmission Screen  Completed By:  Raechel Ache, OT, 10/24/2019 12:08 PM _____________________________________________ _________________________   Discussed status with Dr. Ranell Patrick on 10/24/19 at 12:08PM and received approval for admission today.   Admission Coordinator:  Raechel Ache, time 12:08PM/Date 10/24/19           Revision History

## 2019-10-24 NOTE — Progress Notes (Addendum)
Inpatient Rehabilitation-Admissions Coordinator   I have insurance approval for admit to CIR today. I have spoken with Dr. Benny Lennert who gave clearance for admit to CIR today, pending loop placement today.   Pt still wants to pursue CIR. We have reviewed insurance forms and consent forms. All questions answered. AC has notified RN and TOC team.   Hopeful for loop placement today. If not, CIR admission will need to be pushed to tomorrow.   Raechel Ache, OTR/L  Rehab Admissions Coordinator  [phone] 10/24/2019 11:40 AM  Addendum: 12:49PM: per RN, the patient has her loop recorder placed. Confirmed with Dr. Benny Lennert. Will admit pt to CIR today.   Raechel Ache, OTR/L  Rehab Admissions Coordinator  [phone] 10/24/2019 12:49 PM

## 2019-10-24 NOTE — H&P (Signed)
Physical Medicine and Rehabilitation Admission H&P    Chief Complaint  Patient presents with  .  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  She was evaluated in ED in [place] and was diagnosed with TIA but continued to have issues with disorientation.  Family presented to [facility] on 10/19/2019 for work-up.  MRI brain done showing multiple left frontal and temporal lobe infarcts within the left MCA and additional small acute/subacute infarcts within body of corpus callosum.  Echocardiogram showed EF of 50 to 55%.  Stroke felt to be embolic in nature and patient underwent TEE --negative for mass, thrombus or PFO therefore loop recorder placed 02/19 by Dr. Sallyanne Kuster.  ASA added to Plavix with recommendations to continue DAPT x3 weeks followed by Plavix alone.  With resultant left-sided weakness with speech difficulties and mild expressive aphasia.   Review of Systems  Constitutional: Negative for chills and fever.  HENT: Negative for hearing loss and tinnitus.   Eyes: Positive for blurred vision (right eye and no vision left eye).  Respiratory: Negative for cough and hemoptysis.   Cardiovascular: Negative for chest pain and palpitations.  Gastrointestinal: Negative for abdominal pain, heartburn and nausea.  Genitourinary: Negative for dysuria.  Skin: Negative for rash.  Neurological: Positive for sensory change and weakness.  Psychiatric/Behavioral: Negative for memory loss. The patient is not nervous/anxious.      Past Medical History:  Diagnosis Date  . Diabetes mellitus without complication ([place])   . Stroke [facility])     Past Surgical History:  Procedure Laterality Date  . ABDOMINAL  HYSTERECTOMY     Total  . CESAREAN SECTION  1978  . LOOP RECORDER INSERTION N/A 10/22/2019   Procedure: LOOP RECORDER INSERTION;  Surgeon: Constance Haw, MD;  Location: [facility];  Service: Cardiovascular;  Laterality: N/A;  . Right and Left Eye Surgery for Detached Retinas      Family History  Problem Relation Age of Onset  . Diabetes Mother   . Heart attack Mother   . Cancer Sister        ovarian and cervical   . Leukemia Sister     Social History: Lives with family and ambulated with cane prior to admission. 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   Nephew manages home/cooking and helps with austic grandchild.   Per reports that she has quit smoking. Her smoking use included cigarettes. She has never used smokeless tobacco. She reports that she does not drink alcohol or use drugs.    Allergies  Allergen Reactions  . Latex Other (See Comments)    "Hands turned black with extreme swelling"  . Morphine And Related Hives    Medications Prior to Admission  Medication Sig Dispense Refill  . amitriptyline (ELAVIL) 50 MG tablet Take 25 mg by mouth at bedtime.    Marland Kitchen aspirin EC 81 MG tablet Take 1 tablet (81 mg total) by mouth daily for 21 days. 21 tablet 0  . atorvastatin (LIPITOR) 40 MG tablet Take 40 mg by mouth at bedtime.    . carvedilol (COREG) 25 MG tablet Take 25 mg by mouth 2 (two) times daily.    . clopidogrel (PLAVIX) 75 MG tablet Take 75 mg by mouth daily.    Marland Kitchen  DULoxetine (CYMBALTA) 60 MG capsule Take 60 mg by mouth daily.    Marland Kitchen gabapentin (NEURONTIN) 600 MG tablet Take 600 mg by mouth at bedtime.    Marland Kitchen glyBURIDE-metformin (GLUCOVANCE) 5-500 MG tablet Take 2 tablets by mouth 2 (two) times daily.    . insulin detemir (LEVEMIR) 100 UNIT/ML injection Inject 15 Units into the skin every evening.    . insulin lispro (HUMALOG) 100 UNIT/ML injection Inject 15-30 Units into the skin 3 (three) times daily with meals. Inject 15 units if blood sugar is below 350mg /dL;  Inject 30 units if blood sugar is above 350mg /dL    . oxyCODONE-acetaminophen (PERCOCET/ROXICET) 5-325 MG tablet Take 1 tablet by mouth every 6 (six) hours as needed for moderate pain (pain, neuropathy). 20 tablet 0  . vitamin B-12 (CYANOCOBALAMIN) 100 MCG tablet Take 100 mcg by mouth daily.    . Vitamin D, Ergocalciferol, (DRISDOL) 1.25 MG (50000 UNIT) CAPS capsule Take 50,000 Units by mouth once a week.      Drug Regimen Review  Drug regimen was reviewed and remains appropriate with no significant issues identified  Home: Home Living Family/patient expects to be discharged to:: Private residence Living Arrangements: Other relatives Available Help at Discharge: Family, Available 24 hours/day Type of Home: House Home Equipment: Walker - 2 wheels Additional Comments: per pt's sister that lives in [place], pt lives with her other sister and nephew  Lives With: Other (Comment)(sister and nephew)   Functional History: Prior Function Level of Independence: Needs assistance Gait / Transfers Assistance Needed: ambulated with RW, L side weak since first CVA in 2018 ADL's / Homemaking Assistance Needed: family assisted with ADL's Comments: pt has had some word finding and cognitive issues since first CVA  Functional Status:  Mobility: Bed Mobility Overal bed mobility: Needs Assistance Bed Mobility: Rolling, Sidelying to Sit Rolling: Mod assist, +2 for physical assistance Sidelying to sit: Mod assist, +2 for safety/equipment Supine to sit: +2 for physical assistance, Mod assist Sit to supine: +2 for physical assistance, Mod assist General bed mobility comments: Pt required cues for hand placement to roll for pericare.  assistance to advance B LEs to edge of bed and elevate trunk into a seated position. MOd assistance to scoot forward to edge of bed. Transfers Overall transfer level: Needs assistance Equipment used: Rolling walker (2 wheeled) Transfers: Sit to/from Stand Sit to Stand: Mod  assist, +2 safety/equipment General transfer comment: Mod assistance to boost into standing with cues for hand placement.  POor eccentric load to seated surfacce Ambulation/Gait Ambulation/Gait assistance: Min assist, +2 physical assistance Gait Distance (Feet): 12 Feet(x2) Assistive device: Rolling walker (2 wheeled) Gait Pattern/deviations: Step-through pattern, Decreased stride length, Shuffle, Trunk flexed General Gait Details: Cues for upper trunk control and progressing length of steps.  Assistance for turns with RW.  ADL: ADL Overall ADL's : Needs assistance/impaired Eating/Feeding: Set up, Sitting Grooming: Wash/dry hands, Wash/dry face, Oral care, Sitting, Standing Grooming Details (indicate cue type and reason): completed oral care in standing at sink with min guard for standing balance Upper Body Bathing: Min guard, Sitting Lower Body Bathing: Moderate assistance, Sit to/from stand Upper Body Dressing : Minimal assistance Lower Body Dressing: Total assistance Toilet Transfer: Moderate assistance, +2 for safety/equipment, RW Toilet Transfer Details (indicate cue type and reason): Min assist ambulating to [facility], required Mod assist to stand from [place] and Hygiene: Moderate assistance Functional mobility during ADLs: Minimal assistance, Moderate assistance, +2 for safety/equipment General ADL Comments: Min assist +2 (equipment) ambulating  with RW, required up to Mod A with sit > stand and rolling.  HR 99-102 during activity  Cognition: Cognition Overall Cognitive Status: Within Functional Limits for tasks assessed Arousal/Alertness: Lethargic Orientation Level: Oriented X4 Cognition Arousal/Alertness: Lethargic Behavior During Therapy: WFL for tasks assessed/performed Overall Cognitive Status: Within Functional Limits for tasks assessed Area of Impairment: Memory, Following commands, Safety/judgement, Awareness, Problem solving, Attention,  Orientation Orientation Level: Disoriented to, Situation Current Attention Level: Focused Memory: Decreased short-term memory Following Commands: Follows one step commands with increased time Safety/Judgement: Decreased awareness of deficits Awareness: Intellectual Problem Solving: Slow processing, Decreased initiation, Requires verbal cues, Difficulty sequencing, Requires tactile cues General Comments: pt demosntrates perseveration on statements and repeats them for the next set of questions. Pt when asked to name objects names first object correctly then continues to say that same object name    Height 5\' 4"  (1.626 m).  Physical Exam  Nursing note and vitals reviewed. Constitutional: She is oriented to person, place. She appears well-developed and well-nourished. Obese female. Up in chair. NAD  General: Alert and oriented x 2, No apparent distress HEENT: Head is normocephalic, atraumatic, PERRLA, EOMI, sclera anicteric, oral mucosa pink and moist, dentition intact, ext ear canals clear,  Neck: Supple without JVD or lymphadenopathy Heart: Reg rate and rhythm. No murmurs rubs or gallops Chest: CTA bilaterally without wheezes, rales, or rhonchi; no distress Abdomen: Soft, non-tender, non-distended, bowel sounds positive. Extremities: No clubbing, cyanosis, or edema. Pulses are 2+ Skin: Clean and intact without signs of breakdown Neuro: Pt is cognitively appropriate with normal insight, memory, and awareness. AOx2 (says month is March). +dysarthria, left sided facial droop.  Musculoskeletal: 4+/5 strength throughout. Hyperanalgesia in bilateral lower extremities from knees to feet.  Psych: Pt's affect is appropriate. Pt is cooperative   Results for orders placed or performed during the hospital encounter of 10/19/19 (from the past 48 hour(s))  Glucose, capillary     Status: Abnormal   Collection Time: 10/22/19  8:57 PM  Result Value Ref Range   Glucose-Capillary 290 (H) 70 - 99  mg/dL   Comment 1 Notify RN    Comment 2 Document in Chart   Comprehensive metabolic panel     Status: Abnormal   Collection Time: 10/23/19  3:58 AM  Result Value Ref Range   Sodium 137 135 - 145 mmol/L   Potassium 3.7 3.5 - 5.1 mmol/L   Chloride 105 98 - 111 mmol/L   CO2 24 22 - 32 mmol/L   Glucose, Bld 196 (H) 70 - 99 mg/dL   BUN 13 8 - 23 mg/dL   Creatinine, Ser 0.68 0.44 - 1.00 mg/dL   Calcium 8.1 (L) 8.9 - 10.3 mg/dL   Total Protein 5.8 (L) 6.5 - 8.1 g/dL   Albumin 2.3 (L) 3.5 - 5.0 g/dL   AST 32 15 - 41 U/L   ALT 26 0 - 44 U/L   Alkaline Phosphatase 49 38 - 126 U/L   Total Bilirubin 0.4 0.3 - 1.2 mg/dL   GFR calc non Af Amer >60 >60 mL/min   GFR calc Af Amer >60 >60 mL/min   Anion gap 8 5 - 15    Comment: Performed at [facility], [address], [place], [place] [address]  Glucose, capillary     Status: Abnormal   Collection Time: 10/23/19  6:52 AM  Result Value Ref Range   Glucose-Capillary 181 (H) 70 - 99 mg/dL   Comment 1 Notify RN    Comment  2 Document in Chart   Glucose, capillary     Status: Abnormal   Collection Time: 10/23/19 11:32 AM  Result Value Ref Range   Glucose-Capillary 276 (H) 70 - 99 mg/dL   Comment 1 Notify RN    Comment 2 Document in Chart   Glucose, capillary     Status: Abnormal   Collection Time: 10/23/19  4:05 PM  Result Value Ref Range   Glucose-Capillary 257 (H) 70 - 99 mg/dL   Comment 1 Notify RN    Comment 2 Document in Chart   Glucose, capillary     Status: Abnormal   Collection Time: 10/23/19  9:07 PM  Result Value Ref Range   Glucose-Capillary 190 (H) 70 - 99 mg/dL   Comment 1 Notify RN    Comment 2 Document in Chart   Glucose, capillary     Status: None   Collection Time: 10/24/19  6:03 AM  Result Value Ref Range   Glucose-Capillary 90 70 - 99 mg/dL   Comment 1 Notify RN    Comment 2 Document in Chart   Glucose, capillary     Status: Abnormal   Collection Time: 10/24/19  8:19 AM  Result Value Ref Range    Glucose-Capillary 106 (H) 70 - 99 mg/dL  Glucose, capillary     Status: Abnormal   Collection Time: 10/24/19 12:30 PM  Result Value Ref Range   Glucose-Capillary 377 (H) 70 - 99 mg/dL  Glucose, capillary     Status: Abnormal   Collection Time: 10/24/19  4:39 PM  Result Value Ref Range   Glucose-Capillary 219 (H) 70 - 99 mg/dL   No results found.     Medical Problem List and Plan: 1.  Impaired mobility and ADLs secondary to multiple left frontal and temporal lobe infarcts within the left MCA and additional small acute/subacute infarcts within body of corpus callosum  -patient may shower  -ELOS/Goals: modI PT, OT, SLP 2.  Antithrombotics: -DVT/anticoagulation:  Pharmaceutical: Lovenox  -antiplatelet therapy: DAPT x3 weeks followed by Plavix alone 3. Pain Management: Gabapentin, low-dose Elavil and Cymbalta daily.  Oxycodone as needed pain  Patient is sleeping poorly and with neuropathic pain. Consider increasing Elavil to 50mg  HS 4. Mood: LCSW to follow for evaluation and support  -antipsychotic agents: N/A 5. Neuropsych: This patient is not capable of making decisions on her own behalf. 6. Skin/Wound Care: Routine pressure relief measures.  7. Fluids/Electrolytes/Nutrition: Monitor I/O. Check lytes in am.  8.  HTN: Monitor blood pressures 3 times daily-- continue Coreg twice daily. Well controlled.  9.  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 Was on Metformin and glipizide prior to admission--resume and continue to monitor blood sugars achs.  Titrate medications as indicated. 10. Anemia: Will recheck CBC in am.   Reesa Chew, PA-C  I have personally performed a face to face diagnostic evaluation, including, but not limited to relevant history and physical exam findings, of this patient and developed relevant assessment and plan.  Additionally, I have reviewed and concur with the physician assistant's documentation above.  The patient's status has not changed. The original post  admission physician evaluation remains appropriate, and any changes from the pre-admission screening or documentation from the acute chart are noted above.   Leeroy Cha, MD

## 2019-10-24 NOTE — Progress Notes (Signed)
Physical Therapy Treatment Patient Details Name: Regina Wolfe MRN: [id number] DOB: 06-04-1956 Today's Date: 10/24/2019    History of Present Illness 64 yo female recurrent falls and disorientation. pt found to have several acute/ early subacute infarct on the L frontal and temporal lobe within L MCA, tachycardia,HTN and mild anemia. PMH CAD, CVA 2018 DM2 with neuropathy, HLD, L eye blindnesss secondary to retinal detachment      PT Comments    Pt performed progression of gt training this session.  She continues to require min to moderate assistance.  Plans to d/c to CIR this pm.      Follow Up Recommendations  CIR     Equipment Recommendations  None recommended by PT    Recommendations for Other Services       Precautions / Restrictions Precautions Precautions: Fall Precaution Comments: watch HR Restrictions Weight Bearing Restrictions: No    Mobility  Bed Mobility Overal bed mobility: Needs Assistance Bed Mobility: Supine to Sit     Supine to sit: Mod assist     General bed mobility comments: Pt attempted to move to sitting but required assistance for LE advancement.  Transfers Overall transfer level: Needs assistance Equipment used: Rolling walker (2 wheeled) Transfers: Sit to/from Stand Sit to Stand: Min assist         General transfer comment: Min assistance to boost into standing,.  Cues for hand placement to push from seated surface.  Continues to require assistance for eccetric loading back to seated surface.  Ambulation/Gait Ambulation/Gait assistance: Min assist;+2 physical assistance Gait Distance (Feet): 12 Feet(+ 60 ft) Assistive device: Rolling walker (2 wheeled) Gait Pattern/deviations: Step-through pattern;Decreased stride length;Shuffle;Trunk flexed     General Gait Details: Pt with difficulty sidestepping and turning with RW.  Stride initially short and shuffled.   Continue to require cues for RW position/safety and increasing stride  length.   Stairs             Wheelchair Mobility    Modified Rankin (Stroke Patients Only) Modified Rankin (Stroke Patients Only) Pre-Morbid Rankin Score: Moderate disability Modified Rankin: Moderately severe disability     Balance Overall balance assessment: Needs assistance Sitting-balance support: Bilateral upper extremity supported Sitting balance-Leahy Scale: Good Sitting balance - Comments: reliant on L HHA for stability in sitting, posterior lean with near LOB when moving LE's in sitting Postural control: Posterior lean   Standing balance-Leahy Scale: Poor                              Cognition Arousal/Alertness: Awake/alert Behavior During Therapy: WFL for tasks assessed/performed Overall Cognitive Status: Within Functional Limits for tasks assessed                                        Exercises      General Comments        Pertinent Vitals/Pain Pain Assessment: No/denies pain    Home Living                      Prior Function            PT Goals (current goals can now be found in the care plan section) Acute Rehab PT Goals Patient Stated Goal: to be able to return home Potential to Achieve Goals: Fair Progress towards PT goals: Progressing toward goals  Frequency    Min 4X/week      PT Plan Current plan remains appropriate    Co-evaluation              AM-PAC PT "6 Clicks" Mobility   Outcome Measure  Help needed turning from your back to your side while in a flat bed without using bedrails?: A Lot Help needed moving from lying on your back to sitting on the side of a flat bed without using bedrails?: A Lot Help needed moving to and from a bed to a chair (including a wheelchair)?: A Little Help needed standing up from a chair using your arms (e.g., wheelchair or bedside chair)?: A Little Help needed to walk in hospital room?: A Little Help needed climbing 3-5 steps with a railing? :  A Little 6 Click Score: 16    End of Session Equipment Utilized During Treatment: Gait belt Activity Tolerance: Treatment limited secondary to medical complications (Comment) Patient left: with call bell/phone within reach;with family/visitor present;in chair;with chair alarm set Nurse Communication: Mobility status PT Visit Diagnosis: Muscle weakness (generalized) (M62.81);Other symptoms and signs involving the nervous system (R29.898);Difficulty in walking, not elsewhere classified (R26.2)     Time: [id number] PT Time Calculation (min) (ACUTE ONLY): 34 min  Charges:  $Gait Training: 8-22 mins $Therapeutic Activity: 8-22 mins                     Regina Wolfe , PTA Acute Rehabilitation Services Pager [phone] Office [phone]     Regina Wolfe 10/24/2019, 12:03 PM

## 2019-10-24 NOTE — Progress Notes (Signed)
Regina Arn, MD  Physician  Physical Medicine and Rehabilitation  Consult Note  Signed  Date of Service:  10/22/2019  9:20 AM      Related encounter: ED to Hosp-Admission (Discharged) from 10/19/2019 in [facility]      Signed      Expand AllCollapse All            Physical Medicine and Rehabilitation Consult     Reason for Consult: Stroke with functional deficits.  Referring Physician: Dr. Darrick Meigs   HPI: Regina Wolfe is a 64 y.o. 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  History taken from chart review and patient.  Processing.  She was evaluated in ED out of town and diagnosed with TIA but continued to have issues with disorientation and family presented to [facility] ED on 10/19/2019.  MRI brain showing multiple left frontal and temporal lobe infarcts within left MCA and additional small acute/subacute infract within body of corpus callosum.  Echocardiogram showed ejection fraction of 50-55%.  TEE ordered (to be done 2/18)  to rule out PFO due to embolic stroke of unknown origin --- on DAPT. Therapy evaluations completed and patient with lethargy, difficulty following one step commands and has deficits with ADLs and mobility. CIR recommended due to functional decline.    Review of Systems  Constitutional: Negative for chills and fever.  HENT: Negative for hearing loss.   Eyes: Positive for blurred vision (left eye blind and decreased vision in right eye).  Respiratory: Positive for shortness of breath. Negative for cough.   Cardiovascular: Negative for chest pain.  Gastrointestinal: Positive for heartburn.  Musculoskeletal: Positive for back pain and joint pain.  Skin: Negative for rash.  Neurological: Positive for focal weakness and headaches. Negative for sensory change.  Psychiatric/Behavioral: Positive  for memory loss. The patient is nervous/anxious.           Past Medical History:  Diagnosis Date  . Diabetes mellitus without complication ([place])    . Stroke [facility])             Past Surgical History:  Procedure Laterality Date  . ABDOMINAL HYSTERECTOMY        Total  . CESAREAN SECTION   1978  . Right and Left Eye Surgery for Detached Retinas               Family History  Problem Relation Age of Onset  . Diabetes Mother    . Heart attack Mother    . Cancer Sister          ovarian and cervical   . Leukemia Sister        Social History:  Lives with family. Nephew manages home and drives patient. She reports that she has quit smoking. Her smoking use included cigarettes. She has never used smokeless tobacco. She reports that she does not drink alcohol or use drugs.          Allergies  Allergen Reactions  . Latex Other (See Comments)      "Hands turned black with extreme swelling"  . Morphine And Related Hives    Medications Prior to Admission  Medication Sig Dispense Refill  . amitriptyline (ELAVIL) 50 MG tablet Take 25 mg by mouth at bedtime.      Marland Kitchen atorvastatin (LIPITOR) 40 MG tablet Take 40 mg by mouth at bedtime.      Marland Kitchen  carvedilol (COREG) 25 MG tablet Take 25 mg by mouth 2 (two) times daily.      . clopidogrel (PLAVIX) 75 MG tablet Take 75 mg by mouth daily.      . DULoxetine (CYMBALTA) 60 MG capsule Take 60 mg by mouth daily.      Marland Kitchen gabapentin (NEURONTIN) 600 MG tablet Take 600 mg by mouth at bedtime.      Marland Kitchen glyBURIDE-metformin (GLUCOVANCE) 5-500 MG tablet Take 2 tablets by mouth 2 (two) times daily.      . insulin detemir (LEVEMIR) 100 UNIT/ML injection Inject 15 Units into the skin every evening.      . insulin lispro (HUMALOG) 100 UNIT/ML injection Inject 15-30 Units into the skin 3 (three) times daily with meals. Inject 15 units if blood sugar is below 350mg /dL; Inject 30 units if blood sugar is above 350mg /dL      . losartan (COZAAR) 100 MG tablet Take 100 mg by  mouth daily.      Marland Kitchen oxyCODONE-acetaminophen (PERCOCET) 10-325 MG tablet Take 1 tablet by mouth every 6 (six) hours as needed for pain.       . vitamin B-12 (CYANOCOBALAMIN) 100 MCG tablet Take 100 mcg by mouth daily.      . Vitamin D, Ergocalciferol, (DRISDOL) 1.25 MG (50000 UNIT) CAPS capsule Take 50,000 Units by mouth once a week.          Home: Home Living Family/patient expects to be discharged to:: Private residence Living Arrangements: Other relatives Available Help at Discharge: Family, Available 24 hours/day Type of Home: House Home Equipment: Walker - 2 wheels Additional Comments: per pt's sister that lives in [place], pt lives with her other sister and nephew  Lives With: Other (Comment)(sister and nephew)  Functional History: Prior Function Level of Independence: Needs assistance Gait / Transfers Assistance Needed: ambulated with RW, L side weak since first CVA in 2018 ADL's / Homemaking Assistance Needed: family assisted with ADL's Comments: pt has had some word finding and cognitive issues since first CVA Functional Status:  Mobility: Bed Mobility Overal bed mobility: Needs Assistance Bed Mobility: Supine to Sit, Sit to Supine Supine to sit: +2 for physical assistance, Mod assist Sit to supine: +2 for physical assistance, Mod assist General bed mobility comments: pt requires (A) with pad to bring hips to EOB and hand over hand to reach bed rail. pt with pain with return to supine. pt requires (A) to control trunk to surface Transfers General transfer comment: unable to (A) Ambulation/Gait General Gait Details: deferred as transfers   ADL: ADL Overall ADL's : Needs assistance/impaired Eating/Feeding: Set up, Sitting Grooming: Wash/dry face, Set up, Bed level Upper Body Bathing: Moderate assistance Lower Body Bathing: Total assistance Upper Body Dressing : Moderate assistance Lower Body Dressing: Total assistance General ADL Comments: pt with HR 137 start session and  increased to HR160 with EOB sitting. Unable to progress to basic transfer attempts at this time   Cognition: Cognition Overall Cognitive Status: Impaired/Different from baseline Arousal/Alertness: Lethargic Orientation Level: Oriented to person, Oriented to place, Oriented to time, Oriented to situation Cognition Arousal/Alertness: Lethargic Behavior During Therapy: Flat affect Overall Cognitive Status: Impaired/Different from baseline Area of Impairment: Memory, Following commands, Safety/judgement, Awareness, Problem solving, Attention, Orientation Orientation Level: Disoriented to, Situation Current Attention Level: Focused Memory: Decreased short-term memory Following Commands: Follows one step commands with increased time Safety/Judgement: Decreased awareness of deficits Awareness: Intellectual Problem Solving: Slow processing, Decreased initiation, Requires verbal cues, Difficulty sequencing, Requires tactile cues General Comments:  pt demosntrates perseveration on statements and repeats them for the next set of questions. Pt when asked to name objects names first object correctly then continues to say that same object name     Blood pressure (!) 150/79, pulse 100, temperature 98.6 F (37 C), temperature source Oral, resp. rate 16, height 5\' 4"  (1.626 m), weight 128.3 kg, SpO2 97 %. Physical Exam  Nursing note and vitals reviewed. Constitutional: She appears well-developed and well-nourished.  HENT:  Head: Normocephalic and atraumatic.  Eyes: EOM are normal. Right eye exhibits no discharge. Left eye exhibits no discharge.  Neck: No tracheal deviation present. No thyromegaly present.  Respiratory: Effort normal. No respiratory distress.  GI: She exhibits no distension.  Musculoskeletal:     Comments: No edema or tenderness in extremities  Neurological: She is alert.  Morbidly obese Some difficulty following simple motor commands.  Motor: Bilateral upper extremities: 5/5  proximal distal  Left lower extremity: 5/5 proximal distally  Right lower extremity: 4/5, knee extension 4/5, ankle dorsiflexion 5/5  Skin: Skin is warm and dry.  Psychiatric: Her speech is normal. Her affect is blunt.      Lab Results Last 24 Hours  Results for orders placed or performed during the hospital encounter of 10/19/19 (from the past 24 hour(s))  Glucose, capillary     Status: Abnormal    Collection Time: 10/21/19 11:56 AM  Result Value Ref Range    Glucose-Capillary 172 (H) 70 - 99 mg/dL  Culture, blood (Routine X 2) w Reflex to ID Panel     Status: None (Preliminary result)    Collection Time: 10/21/19  1:29 PM    Specimen: BLOOD RIGHT HAND  Result Value Ref Range    Specimen Description BLOOD RIGHT HAND      Special Requests          BOTTLES DRAWN AEROBIC ONLY Blood Culture adequate volume    Culture          NO GROWTH < 24 HOURS Performed at [facility], [place] [address] [place], [place] [address]      Report Status PENDING    Culture, blood (Routine X 2) w Reflex to ID Panel     Status: None (Preliminary result)    Collection Time: 10/21/19  1:29 PM    Specimen: BLOOD LEFT HAND  Result Value Ref Range    Specimen Description BLOOD LEFT HAND      Special Requests          BOTTLES DRAWN AEROBIC ONLY Blood Culture adequate volume    Culture          NO GROWTH < 24 HOURS Performed at [facility], [place] [address], [place], [place] [address]      Report Status PENDING    Glucose, capillary     Status: Abnormal    Collection Time: 10/21/19  4:55 PM  Result Value Ref Range    Glucose-Capillary 240 (H) 70 - 99 mg/dL  Urinalysis, Routine w reflex microscopic     Status: Abnormal    Collection Time: 10/21/19  7:33 PM  Result Value Ref Range    Color, Urine YELLOW YELLOW    APPearance CLEAR CLEAR    Specific Gravity, Urine 1.014 1.005 - 1.030    pH 6.0 5.0 - 8.0    Glucose, UA >=500 (A) NEGATIVE mg/dL    Hgb urine dipstick NEGATIVE NEGATIVE     Bilirubin Urine NEGATIVE NEGATIVE    Ketones, ur NEGATIVE  NEGATIVE mg/dL    Protein, ur NEGATIVE NEGATIVE mg/dL    Nitrite POSITIVE (A) NEGATIVE    Leukocytes,Ua SMALL (A) NEGATIVE    RBC / HPF 0-5 0 - 5 RBC/hpf    WBC, UA 0-5 0 - 5 WBC/hpf    Bacteria, UA NONE SEEN NONE SEEN    Squamous Epithelial / LPF 0-5 0 - 5  Glucose, capillary     Status: Abnormal    Collection Time: 10/21/19  9:08 PM  Result Value Ref Range    Glucose-Capillary 214 (H) 70 - 99 mg/dL  Basic metabolic panel     Status: Abnormal    Collection Time: 10/22/19  2:05 AM  Result Value Ref Range    Sodium 141 135 - 145 mmol/L    Potassium 3.7 3.5 - 5.1 mmol/L    Chloride 108 98 - 111 mmol/L    CO2 25 22 - 32 mmol/L    Glucose, Bld 161 (H) 70 - 99 mg/dL    BUN 8 8 - 23 mg/dL    Creatinine, Ser 0.62 0.44 - 1.00 mg/dL    Calcium 8.4 (L) 8.9 - 10.3 mg/dL    GFR calc non Af Amer >60 >60 mL/min    GFR calc Af Amer >60 >60 mL/min    Anion gap 8 5 - 15  CBC     Status: Abnormal    Collection Time: 10/22/19  2:05 AM  Result Value Ref Range    WBC 6.2 4.0 - 10.5 K/uL    RBC 4.29 3.87 - 5.11 MIL/uL    Hemoglobin 10.9 (L) 12.0 - 15.0 g/dL    HCT 34.2 (L) 36.0 - 46.0 %    MCV 79.7 (L) 80.0 - 100.0 fL    MCH 25.4 (L) 26.0 - 34.0 pg    MCHC 31.9 30.0 - 36.0 g/dL    RDW 15.1 11.5 - 15.5 %    Platelets 333 150 - 400 K/uL    nRBC 0.0 0.0 - 0.2 %  Glucose, capillary     Status: Abnormal    Collection Time: 10/22/19  6:08 AM  Result Value Ref Range    Glucose-Capillary 131 (H) 70 - 99 mg/dL  Glucose, capillary     Status: Abnormal    Collection Time: 10/22/19  8:42 AM  Result Value Ref Range    Glucose-Capillary 102 (H) 70 - 99 mg/dL       Imaging Results (Last 48 hours)  DG Chest Port 1V same Day   Result Date: 10/21/2019 CLINICAL DATA:  Fever. EXAM: PORTABLE CHEST 1 VIEW COMPARISON:  No recent. FINDINGS: Heart size stable. Low lung volumes. Mild bibasilar atelectasis. Mild infiltrates can not be excluded. No  pleural effusion or pneumothorax. Old left sixth rib fracture. No acute bony abnormality. Degenerative change thoracic spine. IMPRESSION: Low lung volumes with mild bibasilar atelectasis. Mild basilar infiltrates cannot be excluded. Electronically Signed   By: Marcello Moores  Register   On: 10/21/2019 13:47    ECHOCARDIOGRAM COMPLETE   Result Date: 10/20/2019    ECHOCARDIOGRAM REPORT   Patient Name:   RAEDEN BATTIE Date of Exam: 10/20/2019 Medical Rec #:  [id number]     Height:       64.0 in Accession #:    [id number]    Weight:       289.0 lb Date of Birth:  09-11-1955    BSA:          2.29 m Patient Age:    11 years  BP:           158/83 mmHg Patient Gender: F             HR:           111 bpm. Exam Location:  Inpatient Procedure: 2D Echo, Color Doppler, Cardiac Doppler and Intracardiac            Opacification Agent Indications:    Stroke i163.9  History:        Patient has no prior history of Echocardiogram examinations.                 CAD; Risk Factors:Hypertension and Diabetes.  Sonographer:    Raquel Sarna Senior RDCS Referring Phys: [id number] [place] [facility]  Sonographer Comments: Technically difficult study due to poor echo windows. IMPRESSIONS  1. Left ventricular ejection fraction, by estimation, is 50 to 55%. The left ventricle has low normal function. The left ventricle has no regional wall motion abnormalities. Indeterminate diastolic filling due to E-A fusion.  2. Right ventricular systolic function is normal. The right ventricular size is normal. Tricuspid regurgitation signal is inadequate for assessing PA pressure.  3. The mitral valve is degenerative. No evidence of mitral valve regurgitation. No evidence of mitral stenosis.  4. The aortic valve is tricuspid. Aortic valve regurgitation is not visualized. No aortic stenosis is present.  5. The inferior vena cava is normal in size with greater than 50% respiratory variability, suggesting right atrial pressure of 3 mmHg. Comparison(s): No prior Echocardiogram.  Conclusion(s)/Recomendation(s): No intracardiac source of embolism detected on this transthoracic study. A transesophageal echocardiogram is recommended to exclude cardiac source of embolism if clinically indicated. FINDINGS  Left Ventricle: Left ventricular ejection fraction, by estimation, is 50 to 55%. The left ventricle has low normal function. The left ventricle has no regional wall motion abnormalities. Definity contrast agent was given IV to delineate the left ventricular endocardial borders. The left ventricular internal cavity size was normal in size. There is no left ventricular hypertrophy. Indeterminate diastolic filling due to E-A fusion. Right Ventricle: The right ventricular size is normal. No increase in right ventricular wall thickness. Right ventricular systolic function is normal. Tricuspid regurgitation signal is inadequate for assessing PA pressure. Left Atrium: Left atrial size was normal in size. Right Atrium: Right atrial size was normal in size. Pericardium: A small pericardial effusion is present. The pericardial effusion is circumferential. Mitral Valve: The mitral valve is degenerative in appearance. Mild to moderate mitral annular calcification. No evidence of mitral valve regurgitation. No evidence of mitral valve stenosis. Tricuspid Valve: The tricuspid valve is grossly normal. Tricuspid valve regurgitation is not demonstrated. Aortic Valve: The aortic valve is tricuspid. Aortic valve regurgitation is not visualized. No aortic stenosis is present. Pulmonic Valve: The pulmonic valve was grossly normal. Pulmonic valve regurgitation is not visualized. No evidence of pulmonic stenosis. Aorta: The aortic root and ascending aorta are structurally normal, with no evidence of dilitation. Venous: The inferior vena cava is normal in size with greater than 50% respiratory variability, suggesting right atrial pressure of 3 mmHg. IAS/Shunts: No atrial level shunt detected by color flow Doppler.   LEFT VENTRICLE PLAX 2D LVIDd:         4.80 cm LVIDs:         3.60 cm LV PW:         0.90 cm LV IVS:        0.90 cm LVOT diam:     2.00 cm LV SV:  33.30 ml LV SV Index:   21.12 LVOT Area:     3.14 cm  RIGHT VENTRICLE RV S prime:     12.90 cm/s TAPSE (M-mode): 2.1 cm LEFT ATRIUM             Index LA diam:        3.60 cm 1.57 cm/m LA Vol (A2C):   49.8 ml 21.77 ml/m LA Vol (A4C):   67.2 ml 29.38 ml/m LA Biplane Vol: 62.7 ml 27.41 ml/m  AORTIC VALVE LVOT Vmax:   62.70 cm/s LVOT Vmean:  46.200 cm/s LVOT VTI:    0.106 m  AORTA Ao Root diam: 2.90 cm Ao Asc diam:  3.20 cm  SHUNTS Systemic VTI:  0.11 m Systemic Diam: 2.00 cm Eleonore Chiquito MD Electronically signed by Eleonore Chiquito MD Signature Date/Time: 10/20/2019/3:39:46 PM    Final        Assessment/Plan: Diagnosis: 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   Stroke: Continue secondary stroke prophylaxis and Risk Factor Modification listed below:   Antiplatelet therapy:   Blood Pressure Management:  Continue current medication with prn's with permisive HTN per primary team Statin Agent:   Diabetes management:   Right sided hemiparesis: fit for orthosis to prevent contractures (resting hand splint for day, wrist cock up splint at night, PRAFO, etc) PT/OT for mobility, ADL training  Motor recovery: Fluoxetine Labs independently reviewed.  Records reviewed and summated above.   1. Does the need for close, 24 hr/day medical supervision in concert with the patient's rehab needs make it unreasonable for this patient to be served in a less intensive setting? Yes 2. Co-Morbidities requiring supervision/potential complications: history of CVA, CAD, STM deficits, T2DM with neuropathy (Monitor in accordance with exercise and adjust meds as necessary), retinal detachment with left eye blindness, anemia (repeat labs, consider transfusion if necessary to ensure  appropriate perfusion for increased activity tolerance)  3. Due to safety, disease management and patient education, does the patient require 24 hr/day rehab nursing? Yes 4. Does the patient require coordinated care of a physician, rehab nurse, therapy disciplines of PT/OT/SLP to address physical and functional deficits in the context of the above medical diagnosis(es)? Yes Addressing deficits in the following areas: balance, endurance, locomotion, strength, transferring, bathing, dressing, toileting, cognition and psychosocial support 5. Can the patient actively participate in an intensive therapy program of at least 3 hrs of therapy per day at least 5 days per week? Yes 6. The potential for patient to make measurable gains while on inpatient rehab is excellent 7. Anticipated functional outcomes upon discharge from inpatient rehab are supervision  with PT, supervision with OT, n/a with SLP. 8. Estimated rehab length of stay to reach the above functional goals is: 15-18 days. 9. Anticipated discharge destination: Home 10. Overall Rehab/Functional Prognosis: good   RECOMMENDATIONS: This patient's condition is appropriate for continued rehabilitative care in the following setting: Recommend CIR if/when patient agreeable and medical work-up complete.  She would like to go home at present. Patient has agreed to participate in recommended program. Potentially Note that insurance prior authorization may be required for reimbursement for recommended care.   Comment: Rehab Admissions Coordinator to follow up.   I have personally performed a face to face diagnostic evaluation, including, but not limited to relevant history and physical exam findings, of this patient and developed relevant assessment and plan.  Additionally, I have reviewed and concur with the physician  assistant's documentation above.    Delice Lesch, MD, ABPMR Bary Leriche, PA-C 10/22/2019        Revision History                                Routing History

## 2019-10-24 NOTE — H&P (Signed)
Physical Medicine and Rehabilitation Admission H&P    Chief Complaint  Patient presents with  .  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  She was evaluated in ED in [place] and was diagnosed with TIA but continued to have issues with disorientation.  Family presented to [facility] on 10/19/2019 for work-up.  MRI brain done showing multiple left frontal and temporal lobe infarcts within the left MCA and additional small acute/subacute infarcts within body of corpus callosum.  Echocardiogram showed EF of 50 to 55%.  Stroke felt to be embolic in nature and patient underwent TEE --negative for mass, thrombus or PFO therefore loop recorder placed 02/19 by Dr. Sallyanne Kuster.  ASA added to Plavix with recommendations to continue DAPT x3 weeks followed by Plavix alone.  With resultant left-sided weakness with speech difficulties and mild expressive aphasia.   Review of Systems  Constitutional: Negative for chills and fever.  HENT: Negative for hearing loss and tinnitus.   Eyes: Positive for blurred vision (right eye and no vision left eye).  Respiratory: Negative for cough and hemoptysis.   Cardiovascular: Negative for chest pain and palpitations.  Gastrointestinal: Negative for abdominal pain, heartburn and nausea.  Genitourinary: Negative for dysuria.  Skin: Negative for rash.  Neurological: Positive for sensory change and weakness.  Psychiatric/Behavioral: Negative for memory loss. The patient is not nervous/anxious.      Past Medical History:  Diagnosis Date  . Diabetes mellitus without complication ([place])   . Stroke [facility])     Past Surgical History:  Procedure Laterality Date  . ABDOMINAL  HYSTERECTOMY     Total  . CESAREAN SECTION  1978  . LOOP RECORDER INSERTION N/A 10/22/2019   Procedure: LOOP RECORDER INSERTION;  Surgeon: Constance Haw, MD;  Location: [facility];  Service: Cardiovascular;  Laterality: N/A;  . Right and Left Eye Surgery for Detached Retinas      Family History  Problem Relation Age of Onset  . Diabetes Mother   . Heart attack Mother   . Cancer Sister        ovarian and cervical   . Leukemia Sister     Social History: Lives with family and ambulated with cane prior to admission. 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   Nephew manages home/cooking and helps with austic grandchild.   Per reports that she has quit smoking. Her smoking use included cigarettes. She has never used smokeless tobacco. She reports that she does not drink alcohol or use drugs.    Allergies  Allergen Reactions  . Latex Other (See Comments)    "Hands turned black with extreme swelling"  . Morphine And Related Hives    Medications Prior to Admission  Medication Sig Dispense Refill  . amitriptyline (ELAVIL) 50 MG tablet Take 25 mg by mouth at bedtime.    Marland Kitchen atorvastatin (LIPITOR) 40 MG tablet Take 40 mg by mouth at bedtime.    . carvedilol (COREG) 25 MG tablet Take 25 mg by mouth 2 (two) times daily.    . clopidogrel (PLAVIX) 75 MG tablet Take 75 mg by mouth daily.    . DULoxetine (CYMBALTA) 60 MG capsule Take 60 mg by mouth daily.    Marland Kitchen gabapentin (NEURONTIN) 600 MG tablet Take  600 mg by mouth at bedtime.    Marland Kitchen glyBURIDE-metformin (GLUCOVANCE) 5-500 MG tablet Take 2 tablets by mouth 2 (two) times daily.    . insulin detemir (LEVEMIR) 100 UNIT/ML injection Inject 15 Units into the skin every evening.    . insulin lispro (HUMALOG) 100 UNIT/ML injection Inject 15-30 Units into the skin 3 (three) times daily with meals. Inject 15 units if blood sugar is below 350mg /dL; Inject 30 units if blood sugar is above 350mg /dL    . losartan (COZAAR) 100 MG tablet Take 100  mg by mouth daily.    Marland Kitchen oxyCODONE-acetaminophen (PERCOCET) 10-325 MG tablet Take 1 tablet by mouth every 6 (six) hours as needed for pain.     . vitamin B-12 (CYANOCOBALAMIN) 100 MCG tablet Take 100 mcg by mouth daily.    . Vitamin D, Ergocalciferol, (DRISDOL) 1.25 MG (50000 UNIT) CAPS capsule Take 50,000 Units by mouth once a week.      Drug Regimen Review  Drug regimen was reviewed and remains appropriate with no significant issues identified  Home: Home Living Family/patient expects to be discharged to:: Private residence Living Arrangements: Other relatives Available Help at Discharge: Family, Available 24 hours/day Type of Home: House Home Equipment: Walker - 2 wheels Additional Comments: per pt's sister that lives in [place], pt lives with her other sister and nephew  Lives With: Other (Comment)(sister and nephew)   Functional History: Prior Function Level of Independence: Needs assistance Gait / Transfers Assistance Needed: ambulated with RW, L side weak since first CVA in 2018 ADL's / Homemaking Assistance Needed: family assisted with ADL's Comments: pt has had some word finding and cognitive issues since first CVA  Functional Status:  Mobility: Bed Mobility Overal bed mobility: Needs Assistance Bed Mobility: Rolling, Sidelying to Sit Rolling: Mod assist, +2 for physical assistance Sidelying to sit: Mod assist, +2 for safety/equipment Supine to sit: +2 for physical assistance, Mod assist Sit to supine: +2 for physical assistance, Mod assist General bed mobility comments: Pt required cues for hand placement to roll for pericare.  assistance to advance B LEs to edge of bed and elevate trunk into a seated position. MOd assistance to scoot forward to edge of bed. Transfers Overall transfer level: Needs assistance Equipment used: Rolling walker (2 wheeled) Transfers: Sit to/from Stand Sit to Stand: Mod assist, +2 safety/equipment General transfer comment: Mod assistance to  boost into standing with cues for hand placement.  POor eccentric load to seated surfacce Ambulation/Gait Ambulation/Gait assistance: Min assist, +2 physical assistance Gait Distance (Feet): 12 Feet(x2) Assistive device: Rolling walker (2 wheeled) Gait Pattern/deviations: Step-through pattern, Decreased stride length, Shuffle, Trunk flexed General Gait Details: Cues for upper trunk control and progressing length of steps.  Assistance for turns with RW.    ADL: ADL Overall ADL's : Needs assistance/impaired Eating/Feeding: Set up, Sitting Grooming: Wash/dry hands, Wash/dry face, Oral care, Sitting, Standing Grooming Details (indicate cue type and reason): completed oral care in standing at sink with min guard for standing balance Upper Body Bathing: Min guard, Sitting Lower Body Bathing: Moderate assistance, Sit to/from stand Upper Body Dressing : Minimal assistance Lower Body Dressing: Total assistance Toilet Transfer: Moderate assistance, +2 for safety/equipment, RW Toilet Transfer Details (indicate cue type and reason): Min assist ambulating to [facility], required Mod assist to stand from [place] and Hygiene: Moderate assistance Functional mobility during ADLs: Minimal assistance, Moderate assistance, +2 for safety/equipment General ADL Comments: Min assist +2 (equipment) ambulating with RW, required up to Mod A  with sit > stand and rolling.  HR 99-102 during activity  Cognition: Cognition Overall Cognitive Status: Within Functional Limits for tasks assessed Arousal/Alertness: Lethargic Orientation Level: Oriented X4 Cognition Arousal/Alertness: Lethargic Behavior During Therapy: WFL for tasks assessed/performed Overall Cognitive Status: Within Functional Limits for tasks assessed Area of Impairment: Memory, Following commands, Safety/judgement, Awareness, Problem solving, Attention, Orientation Orientation Level: Disoriented to, Situation Current Attention  Level: Focused Memory: Decreased short-term memory Following Commands: Follows one step commands with increased time Safety/Judgement: Decreased awareness of deficits Awareness: Intellectual Problem Solving: Slow processing, Decreased initiation, Requires verbal cues, Difficulty sequencing, Requires tactile cues General Comments: pt demosntrates perseveration on statements and repeats them for the next set of questions. Pt when asked to name objects names first object correctly then continues to say that same object name   Blood pressure (!) 168/89, pulse 92, temperature (!) 97.3 F (36.3 C), temperature source Oral, resp. rate (!) 24, height 5\' 4"  (1.626 m), weight 128.3 kg, SpO2 96 %.  Physical Exam  Nursing note and vitals reviewed. Constitutional: She is oriented to person, place. She appears well-developed and well-nourished. Obese female. Up in chair. NAD  General: Alert and oriented x 2, No apparent distress HEENT: Head is normocephalic, atraumatic, PERRLA, EOMI, sclera anicteric, oral mucosa pink and moist, dentition intact, ext ear canals clear,  Neck: Supple without JVD or lymphadenopathy Heart: Reg rate and rhythm. No murmurs rubs or gallops Chest: CTA bilaterally without wheezes, rales, or rhonchi; no distress Abdomen: Soft, non-tender, non-distended, bowel sounds positive. Extremities: No clubbing, cyanosis, or edema. Pulses are 2+ Skin: Clean and intact without signs of breakdown Neuro: Pt is cognitively appropriate with normal insight, memory, and awareness. AOx2 (says month is March). +dysarthria, left sided facial droop.  Musculoskeletal: 4+/5 strength throughout. Hyperanalgesia in bilateral lower extremities from knees to feet.  Psych: Pt's affect is appropriate. Pt is cooperative   Results for orders placed or performed during the hospital encounter of 10/19/19 (from the past 48 hour(s))  Glucose, capillary     Status: None   Collection Time: 10/22/19 11:57 AM    Result Value Ref Range   Glucose-Capillary 86 70 - 99 mg/dL  Glucose, capillary     Status: Abnormal   Collection Time: 10/22/19  4:38 PM  Result Value Ref Range   Glucose-Capillary 160 (H) 70 - 99 mg/dL  Glucose, capillary     Status: Abnormal   Collection Time: 10/22/19  8:57 PM  Result Value Ref Range   Glucose-Capillary 290 (H) 70 - 99 mg/dL   Comment 1 Notify RN    Comment 2 Document in Chart   Comprehensive metabolic panel     Status: Abnormal   Collection Time: 10/23/19  3:58 AM  Result Value Ref Range   Sodium 137 135 - 145 mmol/L   Potassium 3.7 3.5 - 5.1 mmol/L   Chloride 105 98 - 111 mmol/L   CO2 24 22 - 32 mmol/L   Glucose, Bld 196 (H) 70 - 99 mg/dL   BUN 13 8 - 23 mg/dL   Creatinine, Ser 0.68 0.44 - 1.00 mg/dL   Calcium 8.1 (L) 8.9 - 10.3 mg/dL   Total Protein 5.8 (L) 6.5 - 8.1 g/dL   Albumin 2.3 (L) 3.5 - 5.0 g/dL   AST 32 15 - 41 U/L   ALT 26 0 - 44 U/L   Alkaline Phosphatase 49 38 - 126 U/L   Total Bilirubin 0.4 0.3 - 1.2 mg/dL   GFR calc non Af Amer >  60 >60 mL/min   GFR calc Af Amer >60 >60 mL/min   Anion gap 8 5 - 15    Comment: Performed at [facility] [address], [place], [place] [address]  Glucose, capillary     Status: Abnormal   Collection Time: 10/23/19  6:52 AM  Result Value Ref Range   Glucose-Capillary 181 (H) 70 - 99 mg/dL   Comment 1 Notify RN    Comment 2 Document in Chart   Glucose, capillary     Status: Abnormal   Collection Time: 10/23/19 11:32 AM  Result Value Ref Range   Glucose-Capillary 276 (H) 70 - 99 mg/dL   Comment 1 Notify RN    Comment 2 Document in Chart   Glucose, capillary     Status: Abnormal   Collection Time: 10/23/19  4:05 PM  Result Value Ref Range   Glucose-Capillary 257 (H) 70 - 99 mg/dL   Comment 1 Notify RN    Comment 2 Document in Chart   Glucose, capillary     Status: Abnormal   Collection Time: 10/23/19  9:07 PM  Result Value Ref Range   Glucose-Capillary 190 (H) 70 - 99 mg/dL   Comment 1  Notify RN    Comment 2 Document in Chart   Glucose, capillary     Status: None   Collection Time: 10/24/19  6:03 AM  Result Value Ref Range   Glucose-Capillary 90 70 - 99 mg/dL   Comment 1 Notify RN    Comment 2 Document in Chart   Glucose, capillary     Status: Abnormal   Collection Time: 10/24/19  8:19 AM  Result Value Ref Range   Glucose-Capillary 106 (H) 70 - 99 mg/dL   EP PPM/ICD IMPLANT  Result Date: 10/22/2019 SURGEON:  Allegra Lai, MD   PREPROCEDURE DIAGNOSIS:  Cryptogenic Stroke   POSTPROCEDURE DIAGNOSIS:  Cryptogenic Stroke    PROCEDURES:  1. Implantable loop recorder implantation   INTRODUCTION:  Regina Wolfe is a 64 y.o. female with a history of unexplained stroke who presents today for implantable loop implantation.  The patient has had a cryptogenic stroke.  Despite an extensive workup by neurology, no reversible causes have been identified.  she has worn telemetry during which she did not have arrhythmias.  There is significant concern for possible atrial fibrillation as the cause for the patients stroke.  The patient therefore presents today for implantable loop implantation.   DESCRIPTION OF PROCEDURE:  Informed written consent was obtained, and the patient was brought to the electrophysiology lab in a fasting state.  The patient required no sedation for the procedure today.  Mapping over the patient's chest was performed by the EP lab staff to identify the area where electrograms were most prominent for ILR recording.  This area was found to be the left parasternal region over the 3rd-4th intercostal space. The patients left chest was therefore prepped and draped in the usual sterile fashion by the EP lab staff. The skin overlying the left parasternal region was infiltrated with lidocaine for local analgesia.  A 0.5-cm incision was made over the left parasternal region over the 3rd intercostal space.  A subcutaneous ILR pocket was fashioned using a combination of sharp and blunt  dissection.  A Medtronic Reveal [place] model [id number] implantable loop recorder was then placed into the pocket  R waves were very prominent and measured 0.80mV. EBL<1 ml.  Steri- Strips and a sterile dressing were then applied.  There were  no early apparent complications.   CONCLUSIONS:  1. Successful implantation of a Medtronic Reveal LINQ implantable loop recorder for cryptogenic stroke  2. No early apparent complications.   ECHO TEE  Result Date: 10/22/2019    TRANSESOPHOGEAL ECHO REPORT   Patient Name:   Regina Wolfe Date of Exam: 10/22/2019 Medical Rec #:  [id number]     Height:       64.0 in Accession #:    [id number]    Weight:       282.8 lb Date of Birth:  Feb 24, 1956    BSA:          2.27 m Patient Age:    39 years      BP:           94/43 mmHg Patient Gender: F             HR:           96 bpm. Exam Location:  Inpatient Procedure: Transesophageal Echo Indications:     Stroke  History:         Patient has prior history of Echocardiogram examinations, most                  recent 10/20/2019. CAD; Risk Factors:Hypertension and Diabetes.  Sonographer:     Vikki Ports Turrentine Referring Phys:  [id number] [place] Diagnosing Phys: Sanda Klein MD PROCEDURE: The transesophogeal probe was passed without difficulty through the esophogus of the patient. Sedation performed by different physician. The patient was monitored while under deep sedation. Anesthestetic sedation was provided intravenously by Anesthesiology: 20mg  of Propofol. The patient's vital signs; including heart rate, blood pressure, and oxygen saturation; remained stable throughout the procedure. The patient developed no complications during the procedure. IMPRESSIONS  1. Left ventricular ejection fraction, by estimation, is 50 to 55%. The left ventricle has low normal function. The left ventricle has no regional wall motion abnormalities. Left ventricular diastolic function could not be evaluated.  2. Right ventricular systolic  function is normal. The right ventricular size is normal.  3. No left atrial/left atrial appendage thrombus was detected.  4. The mitral valve is normal in structure and function. No evidence of mitral valve regurgitation. No evidence of mitral stenosis.  5. The aortic valve is normal in structure and function. Aortic valve regurgitation is not visualized. No aortic stenosis is present.  6. The inferior vena cava is normal in size with greater than 50% respiratory variability, suggesting right atrial pressure of 3 mmHg.  7. Agitated saline contrast bubble study was negative, with no evidence of any interatrial shunt. Conclusion(s)/Recomendation(s): No LA/LAA thrombus identified. Negative bubble study for interatrial shunt. No intracardiac source of embolism detected on this on this transesophageal echocardiogram. FINDINGS  Left Ventricle: Left ventricular ejection fraction, by estimation, is 50 to 55%. The left ventricle has low normal function. The left ventricle has no regional wall motion abnormalities. The left ventricular internal cavity size was normal in size. There is no left ventricular hypertrophy. Left ventricular diastolic function could not be evaluated. Right Ventricle: The right ventricular size is normal. No increase in right ventricular wall thickness. Right ventricular systolic function is normal. Left Atrium: Left atrial size was normal in size. No left atrial/left atrial appendage thrombus was detected. Right Atrium: Right atrial size was normal in size. Prominent Chiari network. Pericardium: Trivial pericardial effusion is present. The pericardial effusion is circumferential. Mitral Valve: The mitral valve is normal in structure and function. Normal mobility of the mitral valve  leaflets. No evidence of mitral valve regurgitation. No evidence of mitral valve stenosis. Tricuspid Valve: The tricuspid valve is normal in structure. Tricuspid valve regurgitation is not demonstrated. No evidence of  tricuspid stenosis. Aortic Valve: The aortic valve is normal in structure and function. Aortic valve regurgitation is not visualized. No aortic stenosis is present. Pulmonic Valve: The pulmonic valve was normal in structure. Pulmonic valve regurgitation is not visualized. No evidence of pulmonic stenosis. Aorta: The aortic root is normal in size and structure and the aortic root, ascending aorta and aortic arch are all structurally normal, with no evidence of dilitation or obstruction. Venous: The inferior vena cava is normal in size with greater than 50% respiratory variability, suggesting right atrial pressure of 3 mmHg. IAS/Shunts: No atrial level shunt detected by color flow Doppler. Agitated saline contrast bubble study was negative, with no evidence of any interatrial shunt. Sanda Klein MD Electronically signed by Sanda Klein MD Signature Date/Time: 10/22/2019/3:40:36 PM    Final        Medical Problem List and Plan: 1.  Impaired mobility and ADLs secondary to multiple left frontal and temporal lobe infarcts within the left MCA and additional small acute/subacute infarcts within body of corpus callosum  -patient may shower  -ELOS/Goals: modI PT, OT, SLP 2.  Antithrombotics: -DVT/anticoagulation:  Pharmaceutical: Lovenox  -antiplatelet therapy: DAPT x3 weeks followed by Plavix alone 3. Pain Management: Gabapentin, low-dose Elavil and Cymbalta daily.  Oxycodone as needed pain  Patient is sleeping poorly and with neuropathic pain. Consider increasing Elavil to 50mg  HS 4. Mood: LCSW to follow for evaluation and support  -antipsychotic agents: N/A 5. Neuropsych: This patient is not capable of making decisions on her own behalf. 6. Skin/Wound Care: Routine pressure relief measures.  7. Fluids/Electrolytes/Nutrition: Monitor I/O. Check lytes in am.  8.  HTN: Monitor blood pressures 3 times daily-- continue Coreg twice daily. Well controlled.  9.  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 Was on  Metformin and glipizide prior to admission--resume and continue to monitor blood sugars achs.  Titrate medications as indicated. 10. Anemia: Will recheck CBC in am.   Bary Leriche, PA-C 10/24/2019   I have personally performed a face to face diagnostic evaluation, including, but not limited to relevant history and physical exam findings, of this patient and developed relevant assessment and plan.  Additionally, I have reviewed and concur with the physician assistant's documentation above.  Leeroy Cha, MD

## 2019-10-25 ENCOUNTER — Inpatient Hospital Stay (HOSPITAL_COMMUNITY): Payer: Medicare HMO | Admitting: Physical Therapy

## 2019-10-25 ENCOUNTER — Inpatient Hospital Stay (HOSPITAL_COMMUNITY): Payer: Medicare HMO | Admitting: Occupational Therapy

## 2019-10-25 DIAGNOSIS — E8809 Other disorders of plasma-protein metabolism, not elsewhere classified: Secondary | ICD-10-CM

## 2019-10-25 DIAGNOSIS — E46 Unspecified protein-calorie malnutrition: Secondary | ICD-10-CM

## 2019-10-25 DIAGNOSIS — R7309 Other abnormal glucose: Secondary | ICD-10-CM

## 2019-10-25 DIAGNOSIS — E162 Hypoglycemia, unspecified: Secondary | ICD-10-CM

## 2019-10-25 DIAGNOSIS — I63512 Cerebral infarction due to unspecified occlusion or stenosis of left middle cerebral artery: Secondary | ICD-10-CM

## 2019-10-25 DIAGNOSIS — M792 Neuralgia and neuritis, unspecified: Secondary | ICD-10-CM

## 2019-10-25 DIAGNOSIS — E1142 Type 2 diabetes mellitus with diabetic polyneuropathy: Secondary | ICD-10-CM

## 2019-10-25 DIAGNOSIS — D649 Anemia, unspecified: Secondary | ICD-10-CM

## 2019-10-25 DIAGNOSIS — I10 Essential (primary) hypertension: Secondary | ICD-10-CM

## 2019-10-25 LAB — CBC WITH DIFFERENTIAL/PLATELET
Abs Immature Granulocytes: 0.01 10*3/uL (ref 0.00–0.07)
Basophils Absolute: 0 10*3/uL (ref 0.0–0.1)
Basophils Relative: 1 %
Eosinophils Absolute: 0.4 10*3/uL (ref 0.0–0.5)
Eosinophils Relative: 6 %
HCT: 33.8 % — ABNORMAL LOW (ref 36.0–46.0)
Hemoglobin: 10.5 g/dL — ABNORMAL LOW (ref 12.0–15.0)
Immature Granulocytes: 0 %
Lymphocytes Relative: 40 %
Lymphs Abs: 3 10*3/uL (ref 0.7–4.0)
MCH: 25.1 pg — ABNORMAL LOW (ref 26.0–34.0)
MCHC: 31.1 g/dL (ref 30.0–36.0)
MCV: 80.7 fL (ref 80.0–100.0)
Monocytes Absolute: 0.7 10*3/uL (ref 0.1–1.0)
Monocytes Relative: 9 %
Neutro Abs: 3.3 10*3/uL (ref 1.7–7.7)
Neutrophils Relative %: 44 %
Platelets: 337 10*3/uL (ref 150–400)
RBC: 4.19 MIL/uL (ref 3.87–5.11)
RDW: 15.2 % (ref 11.5–15.5)
WBC: 7.4 10*3/uL (ref 4.0–10.5)
nRBC: 0 % (ref 0.0–0.2)

## 2019-10-25 LAB — COMPREHENSIVE METABOLIC PANEL
ALT: 27 U/L (ref 0–44)
AST: 34 U/L (ref 15–41)
Albumin: 2.4 g/dL — ABNORMAL LOW (ref 3.5–5.0)
Alkaline Phosphatase: 54 U/L (ref 38–126)
Anion gap: 10 (ref 5–15)
BUN: 6 mg/dL — ABNORMAL LOW (ref 8–23)
CO2: 24 mmol/L (ref 22–32)
Calcium: 8.3 mg/dL — ABNORMAL LOW (ref 8.9–10.3)
Chloride: 107 mmol/L (ref 98–111)
Creatinine, Ser: 0.71 mg/dL (ref 0.44–1.00)
GFR calc Af Amer: >60 mL/min (ref >60)
GFR calc non Af Amer: >60 mL/min (ref >60)
Glucose, Bld: 103 mg/dL — ABNORMAL HIGH (ref 70–99)
Potassium: 3.9 mmol/L (ref 3.5–5.1)
Sodium: 141 mmol/L (ref 135–145)
Total Bilirubin: 0.5 mg/dL (ref 0.3–1.2)
Total Protein: 6.1 g/dL — ABNORMAL LOW (ref 6.5–8.1)

## 2019-10-25 LAB — GLUCOSE, CAPILLARY
Glucose-Capillary: 67 mg/dL — ABNORMAL LOW (ref 70–99)
Glucose-Capillary: 131 mg/dL — ABNORMAL HIGH (ref 70–99)
Glucose-Capillary: 253 mg/dL — ABNORMAL HIGH (ref 70–99)
Glucose-Capillary: 226 mg/dL — ABNORMAL HIGH (ref 70–99)
Glucose-Capillary: 190 mg/dL — ABNORMAL HIGH (ref 70–99)

## 2019-10-25 MED ORDER — PRO-STAT SUGAR FREE PO LIQD
30.0000 mL | Freq: Two times a day (BID) | ORAL | Status: DC
  Administered 2019-10-25 – 2019-10-30 (×10): 30 mL via ORAL
  Filled 2019-10-25 (×11): qty 30

## 2019-10-25 MED ORDER — GLYBURIDE 2.5 MG PO TABS
2.5000 mg | ORAL_TABLET | Freq: Two times a day (BID) | ORAL | Status: DC
  Administered 2019-10-25 – 2019-10-30 (×10): 2.5 mg via ORAL
  Filled 2019-10-25 (×10): qty 1

## 2019-10-25 MED ORDER — METFORMIN HCL 500 MG PO TABS
500.0000 mg | ORAL_TABLET | Freq: Two times a day (BID) | ORAL | Status: DC
  Administered 2019-10-25 – 2019-10-30 (×10): 500 mg via ORAL
  Filled 2019-10-25 (×10): qty 1

## 2019-10-25 MED ORDER — HYDROCORTISONE ACETATE 25 MG RE SUPP
25.0000 mg | Freq: Two times a day (BID) | RECTAL | Status: DC
  Administered 2019-10-25 – 2019-10-30 (×9): 25 mg via RECTAL
  Filled 2019-10-25 (×10): qty 1

## 2019-10-25 NOTE — Evaluation (Signed)
Occupational Therapy Assessment and Plan  Patient Details  Name: Regina Wolfe MRN: [id number] Date of Birth: May 08, 1956  OT Diagnosis: disturbance of vision, muscle weakness (generalized) and coordination disorder Rehab Potential: Rehab Potential (ACUTE ONLY): Excellent ELOS: 7-10 days   Today's Date: 10/25/2019 OT Individual Time: 0900-1010 OT Individual Time Calculation (min): 70 min     Problem List:  Patient Active Problem List   Diagnosis Date Noted  . Hypoalbuminemia due to protein-calorie malnutrition ([place])   . Labile blood glucose   . Hypoglycemia   . Neuropathic pain   . Acute ischemic left MCA stroke ([place]) 10/24/2019  . History of CVA (cerebrovascular accident)   . Diabetic peripheral neuropathy ([place])   . CVA (cerebral vascular accident) ([place]) 10/19/2019  . Tachycardia 10/19/2019  . Essential hypertension 10/19/2019  . Anemia 10/19/2019  . Insulin dependent type 2 diabetes mellitus ([place]) 10/19/2019  . Peripheral neuropathy 10/19/2019  . CAD (coronary artery disease) 10/19/2019    Past Medical History:  Past Medical History:  Diagnosis Date  . Diabetes mellitus without complication ([place])   . Stroke [facility])    Past Surgical History:  Past Surgical History:  Procedure Laterality Date  . ABDOMINAL HYSTERECTOMY     Total  . CESAREAN SECTION  1978  . LOOP RECORDER INSERTION N/A 10/22/2019   Procedure: LOOP RECORDER INSERTION;  Surgeon: Constance Haw, MD;  Location: [facility];  Service: Cardiovascular;  Laterality: N/A;  . Right and Left Eye Surgery for Detached Retinas      Assessment & Plan Clinical Impression: Regina Wolfe is a 64 year old right-handed female with history of CVA-2018 with resultant right-sided weakness and mild expressive aphasia, CAD, T2DM with neuropathy, retinal detachment with left eye blindness, cervical cancer 2-week history of recurrent falls with disorientation and B/B incontinence.  She was evaluated in ED in [place] and  was diagnosed with TIA but continued to have issues with disorientation.  Family presented to [facility] on 10[facility]19/2019 for work-up.  MRI brain done showing multiple left frontal and temporal lobe infarcts within the left MCA and additional small acute/subacute infarcts within body of corpus callosum.  Echocardiogram showed EF of 50 to 55%.  Stroke felt to be embolic in nature and patient underwent TEE --negative for mass, thrombus or PFO therefore loop recorder placed 02/19 by Dr. Sallyanne Kuster.  ASA added to Plavix with recommendations to continue DAPT x3 weeks followed by Plavix alone.  With resultant left-sided weakness with speech difficulties and mild expressive aphasia.    Patient currently requires min- max with basic self-care skills secondary to muscle weakness, decreased cardiorespiratoy endurance, decreased coordination, decreased visual acuity and Lt eye blindness and decreased standing balance and decreased balance strategies.  Prior to hospitalization, patient could complete BADLs with supervision to [place].  Patient will benefit from skilled intervention to increase independence with basic self-care skills prior to discharge home with family.  Anticipate patient will require 24 hour supervision and follow up home health.  OT - End of Session Endurance Deficit: Yes OT Assessment Rehab Potential (ACUTE ONLY): Excellent OT Barriers to Discharge: Medical stability OT Patient demonstrates impairments in the following area(s): Balance;Safety;Sensory;Vision;Endurance;Motor;Pain OT Basic ADL's Functional Problem(s): Grooming;Bathing;Dressing;Toileting OT Advanced ADL's Functional Problem(s): Other (comment)(N/A, pt not responsible for IADLs at home) OT Transfers Functional Problem(s): Toilet;Tub/Shower OT Additional Impairment(s): None OT Plan OT Intensity: Minimum of 1-2 x/day, 45 to 90 minutes OT Frequency: 5 out of 7 days OT Duration/Estimated Length of Stay: 7-10 days OT  Treatment/Interventions: Medical illustrator  training;Discharge planning;Pain management;Self Care/advanced ADL retraining;Therapeutic Activities;UE/LE Coordination activities;Disease mangement/prevention;Functional mobility training;Patient/family education;Therapeutic Exercise;Visual/perceptual remediation/compensation;Community reintegration;DME/adaptive equipment instruction;Neuromuscular re-education;Psychosocial support;UE/LE Strength taining/ROM OT Self Feeding Anticipated Outcome(s): No goal OT Basic Self-Care Anticipated Outcome(s): Supervision-Mod I OT Toileting Anticipated Outcome(s): Supervision OT Bathroom Transfers Anticipated Outcome(s): Supervision OT Recommendation Patient destination: Home Follow Up Recommendations: 24 hour supervision/assistance;Home health OT Equipment Recommended: To be determined Skilled Therapeutic Intervention Skilled OT session completed with focus on initial evaluation, education on OT role/POC, and establishment of patient-centered goals.   Pt greeted in the recliner and premedicated for nerve pain in B LEs. Ready for session, motivated to complete bathing and dressing tasks. Parked recliner at the sink and then she participated in stated ADL tasks at sit<stand level. Pt required assist to obtain and maintain figure 4 position with R LE, assist also for washing and dressing Rt lower body. CGA for sit<stand and for dynamic standing balance while completing LB self care. Pt able to incorporate B UEs without issue during session. Pt does note deficits with handwriting after this CVA (she is Rt handed). She completed oral care while standing with steady assist for balance and min vcs for cuing (she has Lt eye blindness at baseline and cataracts with Rt eye, currently waiting for tx). She then completed ambulatory transfer to the toilet using RW with steady assist. Steady assist for balance during clothing mgt and hygiene tasks. Min A for using hand sanitizer once  she returned to the recliner after. Left her in recliner, set her up to call dietary for ordering lunch. All needs within reach and chair alarm set.     OT Evaluation Precautions/Restrictions  Precautions Precautions: Fall Restrictions Weight Bearing Restrictions: No General Chart Reviewed: Yes Vital Signs Therapy Vitals Pulse Rate: 94 Resp: 18 BP: 132/88 Patient Position (if appropriate): Lying Oxygen Therapy SpO2: 96 % O2 Device: Room Air Pain Pain Assessment Pain Scale: 0-10 Pain Score: 9  Pain Type: Neuropathic pain Pain Location: Leg Pain Orientation: Right;Left Pain Descriptors / Indicators: Aching;Stabbing Pain Frequency: Constant Pain Onset: On-going Pain Intervention(s): Medication (See eMAR) Home Living/Prior Functioning Home Living Family/patient expects to be discharged to:: Private residence Living Arrangements: Other relatives Available Help at Discharge: Family, Available 24 hours/day Type of Home: House Home Access: Stairs to enter [employer] of Steps: 1 step up onto stoop Entrance Stairs-Rails: None(usually has help from family to step up.) Home Layout: One level(with basement that pt does not access) Bathroom Shower/Tub: Chiropodist: Handicapped height Bathroom Accessibility: Yes Lives With: Dance movement psychotherapist + nephew) IADL History Homemaking Responsibilities: No Occupation: Retired Type of Occupation: Retired Marine scientist, used to work in a hospital in [place] and Hobbies: "Movie addict" and "chasing after the grandkids" Prior Function Level of Independence: Requires assistive device for independence, Needs assistance with ADLs Dressing: Minimal  Able to Take Stairs?: Yes(with assist) Driving: No Comments: pt has had some word finding and cognitive issues since first CVA ADL ADL Eating: Not assessed Grooming: Contact guard Where Assessed-Grooming: Standing at sink Upper Body Bathing: Setup Where  Assessed-Upper Body Bathing: Sitting at sink Lower Body Bathing: Moderate assistance Where Assessed-Lower Body Bathing: Sitting at sink, Standing at sink Upper Body Dressing: Setup Where Assessed-Upper Body Dressing: Sitting at sink Lower Body Dressing: Maximal assistance Where Assessed-Lower Body Dressing: Standing at sink, Sitting at sink Toileting: Contact guard Where Assessed-Toileting: Glass blower/designer: Therapist, music Method: Ambulating(RW) Science writer: Raised toilet seat Tub/Shower Transfer: Not assessed Vision Baseline Vision/History: Cataracts;Legally blind(blindness Lt eye  due to retinal detachment, cataracts with Rt eye and presently waiting for tx) Patient Visual Report: Blurring of vision(Rt eye) Vision Assessment?: Vision impaired- to be further tested in functional context Perception  Perception: Impaired Praxis Praxis: Intact Cognition Overall Cognitive Status: Within Functional Limits for tasks assessed Arousal/Alertness: Awake/alert Orientation Level: Person;Place;Situation Person: Oriented Place: Oriented Situation: Oriented Year: 2021 Month: February Day of Week: Correct Memory: Appears intact Immediate Memory Recall: Sock;Blue;Bed Memory Recall Sock: With Cue Memory Recall Blue: Without Cue Memory Recall Bed: Without Cue Attention: Sustained Sustained Attention: Appears intact Awareness: Appears intact Safety/Judgment: Appears intact Sensation Sensation Light Touch: Impaired Detail(decreased appreciation to light touch, pt reports neuropathic pain at baseline) Light Touch Impaired Details: Impaired RLE;Impaired LLE Proprioception: Impaired by gross assessment Additional Comments: mild dysmetria on the RLE Coordination Gross Motor Movements are Fluid and Coordinated: No Fine Motor Movements are Fluid and Coordinated: Yes Coordination and Movement Description: Difficultly with coordinating R LE movements to bathe  and dress LB Finger Nose Finger Test: Mild offshooting on the Rt compared to Lt Heel Shin Test: mild dysmetria on the R Motor  Motor Motor: Other (comment) Motor - Skilled Clinical Observations: Rt sided coordination deficits LE>UE Mobility  CGA ambulatory toilet transfer using RW  Trunk/Postural Assessment  Cervical Assessment Cervical Assessment: Within Functional Limits Thoracic Assessment Thoracic Assessment: Within Functional Limits Lumbar Assessment Lumbar Assessment: Within Functional Limits Postural Control Postural Control: Within Functional Limits  Balance Balance Balance Assessed: Yes Dynamic Sitting Balance Dynamic Sitting - Balance Support: No upper extremity supported(washing Lt foot while sitting forward in recliner) Dynamic Sitting - Level of Assistance: 5: Stand by assistance Dynamic Standing Balance Dynamic Standing - Balance Support: No upper extremity supported;During functional activity Dynamic Standing - Level of Assistance: 4: Min assist(managing clothing during toileting tasks) Dynamic Standing - Balance Activities: Lateral lean/weight shifting;Forward lean/weight shifting Extremity/Trunk Assessment RUE Assessment RUE Assessment: Within Functional Limits Active Range of Motion (AROM) Comments: ~120 degrees shoulder flexion, WNL otherwise General Strength Comments: 4+/5 grossly LUE Assessment LUE Assessment: Within Functional Limits Active Range of Motion (AROM) Comments: ~120 degrees shoulder flexion, WNL otherwise General Strength Comments: 4+/5 grossly   Refer to Care Plan for Long Term Goals  Recommendations for other services: None    Discharge Criteria: Patient will be discharged from OT if patient refuses treatment 3 consecutive times without medical reason, if treatment goals not met, if there is a change in medical status, if patient makes no progress towards goals or if patient is discharged from hospital.  The above assessment, treatment  plan, treatment alternatives and goals were discussed and mutually agreed upon: by patient  Skeet Simmer 10/25/2019, 12:12 PM

## 2019-10-25 NOTE — Plan of Care (Signed)
  Problem: RH Balance Goal: LTG Patient will maintain dynamic sitting balance (PT) Description: LTG:  Patient will maintain dynamic sitting balance with assistance during mobility activities (PT) Flowsheets (Taken 10/25/2019 1804) LTG: Pt will maintain dynamic sitting balance during mobility activities with:: Independent with assistive device  Goal: LTG Patient will maintain dynamic standing balance (PT) Description: LTG:  Patient will maintain dynamic standing balance with assistance during mobility activities (PT) Flowsheets (Taken 10/25/2019 1804) LTG: Pt will maintain dynamic standing balance during mobility activities with:: Supervision/Verbal cueing   Problem: RH Bed Mobility Goal: LTG Patient will perform bed mobility with assist (PT) Description: LTG: Patient will perform bed mobility with assistance, with/without cues (PT). Flowsheets (Taken 10/25/2019 1804) LTG: Pt will perform bed mobility with assistance level of: Independent with assistive device    Problem: RH Bed to Chair Transfers Goal: LTG Patient will perform bed/chair transfers w/assist (PT) Description: LTG: Patient will perform bed to chair transfers with assistance (PT). Flowsheets (Taken 10/25/2019 1804) LTG: Pt will perform Bed to Chair Transfers with assistance level: Independent with assistive device    Problem: RH Car Transfers Goal: LTG Patient will perform car transfers with assist (PT) Description: LTG: Patient will perform car transfers with assistance (PT). Flowsheets (Taken 10/25/2019 1804) LTG: Pt will perform car transfers with assist:: Supervision/Verbal cueing   Problem: RH Ambulation Goal: LTG Patient will ambulate in controlled environment (PT) Description: LTG: Patient will ambulate in a controlled environment, # of feet with assistance (PT). Flowsheets (Taken 10/25/2019 1804) LTG: Pt will ambulate in controlled environ  assist needed:: Supervision/Verbal cueing LTG: Ambulation distance in controlled  environment: 155ft with LRAD Goal: LTG Patient will ambulate in home environment (PT) Description: LTG: Patient will ambulate in home environment, # of feet with assistance (PT). Flowsheets (Taken 10/25/2019 1804) LTG: Pt will ambulate in home environ  assist needed:: Supervision/Verbal cueing LTG: Ambulation distance in home environment: 72ft with LRAD   Problem: RH Wheelchair Mobility Goal: LTG Patient will propel w/c in controlled environment (PT) Description: LTG: Patient will propel wheelchair in controlled environment, # of feet with assist (PT) Flowsheets (Taken 10/25/2019 1804) LTG: Pt will propel w/c in controlled environ  assist needed:: Supervision/Verbal cueing LTG: Propel w/c distance in controlled environment: 128ft   Problem: RH Stairs Goal: LTG Patient will ambulate up and down stairs w/assist (PT) Description: LTG: Patient will ambulate up and down # of stairs with assistance (PT) Flowsheets (Taken 10/25/2019 1804) LTG: Pt will ambulate up/down stairs assist needed:: Contact Guard/Touching assist LTG: Pt will  ambulate up and down number of stairs: 1 step without rail to access house

## 2019-10-25 NOTE — Progress Notes (Signed)
Patient brief noted with bright red mucous after urinating. Patient having small anxiety episode about the amount of blood present. MD on-call aware of issue. Patient states she has hemorrhoids, new medication has been prescribed. Patient vitals WNL, no distress or pain noted other than concerns of bleeding. Staff will continue to monitor and assess issue.

## 2019-10-25 NOTE — Evaluation (Signed)
Physical Therapy Assessment and Plan  Patient Details  Name: Regina Wolfe MRN: [id number] Date of Birth: May 07, 1956  PT Diagnosis: Abnormality of gait, Coordination disorder, Hemiplegia dominant and Muscle weakness Rehab Potential: Good ELOS: 6-8 days   Today's Date: 10/25/2019 PT Individual Time: 1010-1120 AND 0102-7253 PT Individual Time Calculation (min): 70 min  And 50 min   Problem List:  Patient Active Problem List   Diagnosis Date Noted  . Hypoalbuminemia due to protein-calorie malnutrition ([place])   . Labile blood glucose   . Hypoglycemia   . Neuropathic pain   . Acute ischemic left MCA stroke ([place]) 10/24/2019  . History of CVA (cerebrovascular accident)   . Diabetic peripheral neuropathy ([place])   . CVA (cerebral vascular accident) ([place]) 10/19/2019  . Tachycardia 10/19/2019  . Essential hypertension 10/19/2019  . Anemia 10/19/2019  . Insulin dependent type 2 diabetes mellitus ([place]) 10/19/2019  . Peripheral neuropathy 10/19/2019  . CAD (coronary artery disease) 10/19/2019    Past Medical History:  Past Medical History:  Diagnosis Date  . Diabetes mellitus without complication ([place])   . Stroke [facility])    Past Surgical History:  Past Surgical History:  Procedure Laterality Date  . ABDOMINAL HYSTERECTOMY     Total  . CESAREAN SECTION  1978  . LOOP RECORDER INSERTION N/A 10/22/2019   Procedure: LOOP RECORDER INSERTION;  Surgeon: Constance Haw, MD;  Location: [place] CV LAB;  Service: Cardiovascular;  Laterality: N/A;  . Right and Left Eye Surgery for Detached Retinas      Assessment & Plan Clinical Impression: Patient is a 64 year old right-handed female with history of CVA-2018 with resultant right-sided weakness and mild expressive aphasia, CAD, T2DM with neuropathy, retinal detachment with left eye blindness, cervical cancer 2-week history of recurrent falls with disorientation and B/B incontinence.  She was evaluated in ED in [place] and was diagnosed  with TIA but continued to have issues with disorientation.  Family presented [facility]2019 for work-up.  MRI brain done showing multiple left frontal and temporal lobe infarcts within the left MCA and additional small acute/subacute infarcts within body of corpus callosum.  Echocardiogram showed EF of 50 to 55%.  Stroke felt to be embolic in nature and patient underwent TEE --negative for mass, thrombus or PFO therefore loop recorder placed 02/19 by Dr. Sallyanne Kuster.   Patient transferred to CIR on 10/24/2019 .   Patient currently requires min with mobility secondary to muscle weakness, decreased cardiorespiratoy endurance, decreased coordination, decreased visual acuity and decreased visual perceptual skills, decreased attention to right and decreased sitting balance, decreased standing balance, decreased postural control, hemiplegia and decreased balance strategies.  Prior to hospitalization, patient was modified independent  with mobility and lived with Family in a House home.  Home access is 1 step up onto stoopStairs to enter.  Patient will benefit from skilled PT intervention to maximize safe functional mobility, minimize fall risk and decrease caregiver burden for planned discharge home with 24 hour supervision.  Anticipate patient will benefit from follow up [place] at discharge.  PT - End of Session Activity Tolerance: Tolerates 10 - 20 min activity with multiple rests Endurance Deficit: Yes PT Assessment Rehab Potential (ACUTE/IP ONLY): Good PT Barriers to Discharge: Inaccessible home environment;Medication compliance;Weight PT Patient demonstrates impairments in the following area(s): Balance;Edema;Endurance;Motor;Nutrition;Perception;Safety PT Transfers Functional Problem(s): Bed Mobility;Bed to Chair;Car;Furniture;Floor PT Locomotion Functional Problem(s): Ambulation;Wheelchair Mobility;Stairs PT Plan PT Intensity: Minimum of 1-2 x/day ,45 to 90 minutes PT Frequency: 5 out of 7  days  PT Duration Estimated Length of Stay: 6-8 days PT Treatment/Interventions: Ambulation/gait training;Discharge planning;Functional mobility training;Psychosocial support;Visual/perceptual remediation/compensation;Therapeutic Activities;Wheelchair propulsion/positioning;Therapeutic Exercise;Skin care/wound management;Neuromuscular re-education;Disease management/prevention;Balance/vestibular training;Cognitive remediation/compensation;DME/adaptive equipment instruction;Pain management;Splinting/orthotics;UE/LE Strength taining/ROM;Stair training;UE/LE Coordination activities;Patient/family education;Functional electrical stimulation;Community reintegration PT Transfers Anticipated Outcome(s): Mod I with LRAD PT Locomotion Anticipated Outcome(s): ambulatory with Supervision assist and LRAD at house hold level PT Recommendation Recommendations for Other Services: Therapeutic Recreation consult Therapeutic Recreation Interventions: Stress management;Outing/community reintergration Follow Up Recommendations: Home health PT Patient destination: Home Equipment Recommended: To be determined;Wheelchair (measurements)  Skilled Therapeutic Intervention Session 1 Pt received sitting in WC and agreeable to PT. PT instructed patient in PT Evaluation and initiated treatment intervention; see below for results. PT educated patient in [place], rehab potential, rehab goals, and discharge recommendations. PT instructed p tin WC mobility with min assist as listed below, asist to prevent veer to the R. Gait training with RW x 47f with min A-CGA for safety with RW. Car transfer performed with min A for BLE management into floor board. Pt also performed ascent and descent of 1 step and UE support to simulate home access as listed below, min assist overall from PT for safety and cues for step to gait pattern. Patient returned to room and [facility] in reach and all needs met.     Session  2.   Pt  received supine in bed and agreeable to PT. Pt reports having multiple loose stool throughout  Supine>sit transfer with supervision assist and min cues for safety, as pt very close to EOB initially. Stand pivot transfer to recliner with RW and CGA for safety.    Patient demonstrates increased fall risk as noted by score of   33/56 on Berg Balance Scale.  (<36= high risk for falls, close to 100%; 37-45 significant >80%; 46-51 moderate >50%; 52-55 lower >25%)  PT instructed pt in TUG with RW: average of 3 trials: 48 sec (52sec. 48sec. 44sec) supervision assist from PT throughout with cues for safety in turns. (>15 sec indicates increased fall risk)   Pt performed stand pivot transfer to bed with RW and min assist to prevent posterior LOB. Sit>supine completed with supervision assist, and left supine in bed with call bell in reach and all needs met.      PT Evaluation Precautions/Restrictions Restrictions Weight Bearing Restrictions: No General   Vital SignsTherapy Vitals Pulse Rate: 94 Resp: 18 BP: 132/88 Patient Position (if appropriate): Lying Oxygen Therapy SpO2: 96 % O2 Device: Room Air Pain Pain Assessment Pain Scale: 0-10 Pain Score: 9  Pain Type: Neuropathic pain Pain Location: Leg Pain Orientation: Right;Left Pain Descriptors / Indicators: Aching;Stabbing Pain Frequency: Constant Pain Onset: On-going Pain Intervention(s): Medication (See eMAR) Home Living/Prior Functioning Home Living Available Help at Discharge: Family;Available 24 hours/day Type of Home: House Home Access: Stairs to enter [employer] Steps: 1 step up onto stoop Entrance Stairs-Rails: None(usually has help from family to step up.) Home Layout: Multi-level Additional Comments: per pt's sister that lives in [place] pt lives with her other sister and nephew  Lives With: Family Prior Function Level of Independence: Requires assistive device for independence(has RW, and SPC)  Able to Take  Stairs?: Yes(with assist) Driving: No Comments: pt has had some word finding and cognitive issues since first CVA Vision/Perception  Vision - Assessment Additional Comments: L eye blind from retinal detachment. MILd R peripheral vision deficits. Perception Perception: Impaired Praxis Praxis: Intact  Cognition Orientation Level: Oriented X4 Sensation Sensation Light Touch:  Appears Intact Proprioception: Impaired by gross assessment Additional Comments: mild dysmetria on the RLE Coordination Gross Motor Movements are Fluid and Coordinated: No Coordination and Movement Description: mild dysmetria on the R Heel Shin Test: mild dysmetria on the R  Mobility Bed Mobility Bed Mobility: Rolling Right;Rolling Left;Supine to Sit;Sit to Supine Rolling Right: Supervision/verbal cueing Rolling Left: Supervision/Verbal cueing Supine to Sit: Supervision/Verbal cueing Sit to Supine: Supervision/Verbal cueing Transfers Transfers: Sit to Stand;Stand Pivot Transfers Sit to Stand: Minimal Assistance - Patient > 75% Stand Pivot Transfers: Minimal Assistance - Patient > 75% Transfer (Assistive device): Rolling walker Locomotion  Gait Ambulation: Yes Gait Assistance: Minimal Assistance - Patient > 75% Gait Distance (Feet): 50 Feet Assistive device: Rolling walker Gait Assistance Details: Tactile cues for weight shifting Gait Gait: Yes Gait Pattern: Impaired Gait Pattern: Right hip hike;Narrow base of support;Decreased stride length Stairs / Additional Locomotion Stairs: Yes Stairs Assistance: Minimal Assistance - Patient > 75% Stair Management Technique: Two rails Number of Stairs: 1 Height of Stairs: 6 Wheelchair Mobility Wheelchair Mobility: Yes Wheelchair Assistance: Independent with Camera operator: Both upper extremities Wheelchair Parts Management: Needs assistance Distance: 34f  Trunk/Postural Assessment  Cervical Assessment Cervical Assessment:  Within Functional Limits Thoracic Assessment Thoracic Assessment: Within Functional Limits Lumbar Assessment Lumbar Assessment: Within Functional Limits Postural Control Postural Control: Within Functional Limits  Balance Balance Balance Assessed: Yes Static Sitting Balance Static Sitting - Balance Support: No upper extremity supported Static Sitting - Level of Assistance: 6: Modified independent (Device/Increase time) Dynamic Sitting Balance Dynamic Sitting - Balance Support: No upper extremity supported Dynamic Sitting - Level of Assistance: 5: Stand by assistance Static Standing Balance Static Standing - Balance Support: No upper extremity supported Static Standing - Level of Assistance: 4: Min assist Dynamic Standing Balance Dynamic Standing - Balance Support: Bilateral upper extremity supported Dynamic Standing - Level of Assistance: 4: Min assist Extremity Assessment      RLE Assessment RLE Assessment: Exceptions to [facility] Strength Comments: grossly 4+/5 proximal to distal with mild coordination deficits LLE Assessment LLE Assessment: Within Functional Limits General Strength Comments: grossly 4+/5 to 5/5 proximal to distal    Refer to Care Plan for Long Term Goals  Recommendations for other services: Therapeutic Recreation  Stress management and Outing/community reintegration  Discharge Criteria: Patient will be discharged from PT if patient refuses treatment 3 consecutive times without medical reason, if treatment goals not met, if there is a change in medical status, if patient makes no progress towards goals or if patient is discharged from hospital.  The above assessment, treatment plan, treatment alternatives and goals were discussed and mutually agreed upon: by patient  ALorie Phenix2/20/2021, 11:22 AM

## 2019-10-25 NOTE — Progress Notes (Signed)
[place] PHYSICAL MEDICINE & REHABILITATION PROGRESS NOTE  Subjective/Complaints: Patient seen laying in bed this morning.  She states she slept well overnight.  She states she is ready to begin therapies today.  Notified by nursing regarding hypoglycemia this AM.  ROS: +Neuropathic pain in bilateral feet.  Denies CP, shortness of breath, nausea, vomiting, diarrhea.  Objective: Vital Signs: Blood pressure 132/88, pulse 94, temperature 98.6 F (37 C), resp. rate 18, height 5\' 4"  (1.626 m), SpO2 96 %. No results found. Recent Labs    10/25/19 0633  WBC 7.4  HGB 10.5*  HCT 33.8*  PLT 337   Recent Labs    10/23/19 0358 10/25/19 0633  NA 137 141  K 3.7 3.9  CL 105 107  CO2 24 24  GLUCOSE 196* 103*  BUN 13 6*  CREATININE 0.68 0.71  CALCIUM 8.1* 8.3*    Physical Exam: BP 132/88 (BP Location: Right Arm)   Pulse 94   Temp 98.6 F (37 C)   Resp 18   Ht 5\' 4"  (1.626 m)   SpO2 96%   BMI 48.55 kg/m  Constitutional: No distress . Vital signs reviewed.  Morbidly obese. HENT: Normocephalic.  Atraumatic. Eyes: EOMI. No discharge. Cardiovascular: No JVD. Respiratory: Normal effort.  No stridor. GI: Non-distended. Skin: Warm and dry.  Intact. Psych: Normal mood.  Normal behavior. Musc: No edema in extremities.  No tenderness in extremities. Neuro:  Alert Left facial weakness Motor: RUE/RLE: 4+-5/5 proximal distal LUE/LLE: 5/5 proximal distal  Assessment/Plan: 1. Functional deficits secondary to left MCA infarcts which require 3+ hours per day of interdisciplinary therapy in a comprehensive inpatient rehab setting.  Physiatrist is providing close team supervision and 24 hour management of active medical problems listed below.  Physiatrist and rehab team continue to assess barriers to discharge/monitor patient progress toward functional and medical goals  Care Tool:  Bathing    Body parts bathed by patient: Right arm, Left arm, Chest, Abdomen, Front perineal area,  Right upper leg, Left upper leg, Face   Body parts bathed by helper: Buttocks, Right lower leg, Left lower leg     Bathing assist Assist Level: Minimal Assistance - Patient > 75%     Upper Body Dressing/Undressing Upper body dressing   What is the patient wearing?: Pull over shirt    Upper body assist Assist Level: Set up assist    Lower Body Dressing/Undressing Lower body dressing      What is the patient wearing?: Underwear/pull up, Pants     Lower body assist Assist for lower body dressing: Moderate Assistance - Patient 50 - 74%     Toileting Toileting    Toileting assist Assist for toileting: Contact Guard/Touching assist     Transfers Chair/bed transfer  Transfers assist           Locomotion Ambulation   Ambulation assist              Walk 10 feet activity   Assist           Walk 50 feet activity   Assist           Walk 150 feet activity   Assist           Walk 10 feet on uneven surface  activity   Assist           Wheelchair     Assist               Wheelchair 50 feet with 2  turns activity    Assist            Wheelchair 150 feet activity     Assist            Medical Problem List and Plan: 1.  Impaired mobility and ADLs secondary to multiple left frontal and temporal lobe infarcts within the left MCA and additional small acute/subacute infarcts within body of corpus callosum  Begin CIR evaluations 2.  Antithrombotics: -DVT/anticoagulation:  Pharmaceutical: Lovenox             -antiplatelet therapy: DAPT x3 weeks followed by Plavix alone 3. Pain Management: Gabapentin, low-dose Elavil and Cymbalta daily.  Oxycodone as needed pain             Monitor with increased exertion 4. Mood: LCSW to follow for evaluation and support             -antipsychotic agents: N/A 5. Neuropsych: This patient is not capable of making decisions on her own behalf. 6. Skin/Wound Care: Routine  pressure relief measures.  7. Fluids/Electrolytes/Nutrition: Monitor I/O. Check lytes in am.  8.  HTN: Monitor blood pressures   Continue Coreg twice daily.   Monitor with increased mobility 9.  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 Was on Metformin and glipizide prior to admission--resumed  Glipizide decreased to 2.5 twice daily on 2/20  Continue Metformin 500 twice daily  Continue to monitor blood sugars achs.    Titrate medications as indicated.  Extremely labile with hypoglycemia on 2/20 10. Anemia:   Hemoglobin 10.5 on 2/20, trending down, labs ordered for Monday  Continue to monitor 11.  Hypoalbuminemia  Supplement initiated on 2/20  LOS: 1 days A FACE TO FACE EVALUATION WAS PERFORMED  [place] Lorie Phenix 10/25/2019, 10:41 AM

## 2019-10-25 NOTE — Progress Notes (Signed)
Hypoglycemic Event  CBG: 67  Treatment: 8 oz juice/soda  Symptoms: None  Follow-up CBG: Time:647 CBG Result:131  Possible Reasons for Event: Unknown  Comments/MD notified:yes    Regina Wolfe, [employer]

## 2019-10-26 ENCOUNTER — Inpatient Hospital Stay (HOSPITAL_COMMUNITY): Payer: Medicare HMO | Admitting: Speech Pathology

## 2019-10-26 LAB — CULTURE, BLOOD (ROUTINE X 2)
Culture: NO GROWTH
Culture: NO GROWTH
Special Requests: ADEQUATE
Special Requests: ADEQUATE

## 2019-10-26 LAB — GLUCOSE, CAPILLARY
Glucose-Capillary: 139 mg/dL — ABNORMAL HIGH (ref 70–99)
Glucose-Capillary: 150 mg/dL — ABNORMAL HIGH (ref 70–99)
Glucose-Capillary: 154 mg/dL — ABNORMAL HIGH (ref 70–99)
Glucose-Capillary: 123 mg/dL — ABNORMAL HIGH (ref 70–99)

## 2019-10-26 NOTE — Evaluation (Signed)
Speech Language Pathology Assessment and Plan  Patient Details  Name: Regina Wolfe MRN: [id number] Date of Birth: 09/30/1955  SLP Diagnosis: Aphasia  Rehab Potential: Good ELOS: 7-10 days    Today's Date: 10/26/2019 SLP Individual Time: 3785-8850 SLP Individual Time Calculation (min): 56 min   Problem List:  Patient Active Problem List   Diagnosis Date Noted  . Hypoalbuminemia due to protein-calorie malnutrition ([place])   . Labile blood glucose   . Hypoglycemia   . Neuropathic pain   . Acute ischemic left MCA stroke ([place]) 10/24/2019  . History of CVA (cerebrovascular accident)   . Diabetic peripheral neuropathy ([place])   . CVA (cerebral vascular accident) ([place]) 10/19/2019  . Tachycardia 10/19/2019  . Essential hypertension 10/19/2019  . Anemia 10/19/2019  . Insulin dependent type 2 diabetes mellitus ([place]) 10/19/2019  . Peripheral neuropathy 10/19/2019  . CAD (coronary artery disease) 10/19/2019   Past Medical History:  Past Medical History:  Diagnosis Date  . Diabetes mellitus without complication ([place])   . Stroke [facility])    Past Surgical History:  Past Surgical History:  Procedure Laterality Date  . ABDOMINAL HYSTERECTOMY     Total  . BUBBLE STUDY  10/22/2019   Procedure: BUBBLE STUDY;  Surgeon: Sanda Klein, MD;  Location: [place];  Service: Cardiovascular;;  . [place]  . LOOP RECORDER INSERTION N/A 10/22/2019   Procedure: LOOP RECORDER INSERTION;  Surgeon: Constance Haw, MD;  Location: [place] CV LAB;  Service: Cardiovascular;  Laterality: N/A;  . Right and Left Eye Surgery for Detached Retinas    . TEE WITHOUT CARDIOVERSION N/A 10/22/2019   Procedure: TRANSESOPHAGEAL ECHOCARDIOGRAM (TEE);  Surgeon: Sanda Klein, MD;  Location: [facility];  Service: Cardiovascular;  Laterality: N/A;    Assessment / Plan / Recommendation Clinical Impression   Regina Wolfe is a 64 year old right-handed female with history of CVA-2018 with  resultant right-sided weakness and mild expressive aphasia, CAD, T2DM with neuropathy, retinal detachment with left eye blindness, cervical cancer 2-week history of recurrent falls with disorientation and B/B incontinence.  She was evaluated in ED in [place] and was diagnosed with TIA but continued to have issues with disorientation.  Family presented to [facility] on 10/19/2019 for work-up.  MRI brain done showing multiple left frontal and temporal lobe infarcts within the left MCA and additional small acute/subacute infarcts within body of corpus callosum.  Echocardiogram showed EF of 50 to 55%.  Stroke felt to be embolic in nature and patient underwent TEE --negative for mass, thrombus or PFO therefore loop recorder placed 02/19 by Dr. Sallyanne Kuster.  ASA added to Plavix with recommendations to continue DAPT x3 weeks followed by Plavix alone.  With resultant left-sided weakness with speech difficulties and mild expressive aphasia. SLP evaluation was completed on 10/26/2019 with the following results:  Pt presents with slight worsening of communication deficits per pt report.  Pt endorses baseline language impairment post previous CVA but her speech is now more effortful in light of acute infarct.  Pt has also been quite lethargic since being hospitalized which pt attributes to pain medication being given for leg pain.  Overall, pt needs supervision and more than a reasonable amount of time to convey more complex or abstract ideas, needs, and wants.  SLP provided skilled education regarding compensatory word finding strategies and provided pt with a handout to maximize carryover in between therapy sessions.   When pt was at her most alert, her cognition appeared grossly intact or at least what I  expect it was prior to admission with only mild slowness for recalling new, daily information.  She was incontinent upon therapist's arrival and had not yet called for assistance in getting changed; however, I suspect this to  be secondary to lethargy rather than cognitive dysfunction as she took a significant amount of time to awaken for today's therapy session and then also quickly fatigued once seated at the edge of the bed.  As a result, pt would benefit from skilled ST while inpatient in order to maximize functional independence and reduce burden of care prior to discharge.  Anticipate that pt will need to resume at least intermittent supervision and assistance for medication and financial management at discharge but I doubt that she will need additional ST services post CIR as she seems close to her baseline.    Skilled Therapeutic Interventions          Cognitive-linguistic evaluation completed with results and recommendations reviewed with family.     SLP Assessment  Patient will need skilled [place] Pathology Services during CIR admission    Recommendations  Patient destination: Home Follow up Recommendations: None Equipment Recommended: None recommended by SLP    SLP Frequency 3 to 5 out of 7 days   SLP Duration  SLP Intensity  SLP Treatment/Interventions 7-10 days  Minumum of 1-2 x/day, 30 to 90 minutes  Cueing hierarchy;Speech/Language facilitation;Patient/family education;Environmental controls;Functional tasks;Internal/external aids    Pain Pain Assessment Pain Scale: 0-10 Pain Score: 0-No pain  Prior Functioning Cognitive/Linguistic Baseline: Baseline deficits Baseline deficit details: baseline language deficits post previous CVA Type of Home: House  Lives With: Family Available Help at Discharge: Family;Available 24 hours/day Vocation: Retired  Programmer, systems Overall Cognitive Status: History of cognitive impairments - at baseline Arousal/Alertness: Awake/alert Orientation Level: Oriented X4 Attention: Sustained Sustained Attention: Appears intact Memory: Impaired Memory Impairment: Decreased recall of new information Awareness: Appears intact Problem  Solving: Appears intact Safety/Judgment: Appears intact  Comprehension Auditory Comprehension Overall Auditory Comprehension: Appears within functional limits for tasks assessed Expression Expression Primary Mode of Expression: Verbal Verbal Expression Overall Verbal Expression: Impaired Initiation: (slowed, but suspect due to lethargy rather than language) Level of Generative/Spontaneous Verbalization: Sentence Naming: Impairment Other Naming Comments: mild word finding deficits Pragmatics: No impairment Oral Motor Oral Motor/Sensory Function Overall Oral Motor/Sensory Function: Within functional limits Motor Speech Overall Motor Speech: Appears within functional limits for tasks assessed   Short Term Goals: Week 1: SLP Short Term Goal 1 (Week 1): STG=LTG due to ELOS  Refer to Care Plan for Long Term Goals  Recommendations for other services: None   Discharge Criteria: Patient will be discharged from SLP if patient refuses treatment 3 consecutive times without medical reason, if treatment goals not met, if there is a change in medical status, if patient makes no progress towards goals or if patient is discharged from hospital.  The above assessment, treatment plan, treatment alternatives and goals were discussed and mutually agreed upon: by patient  Emilio Math 10/26/2019, 11:16 AM

## 2019-10-26 NOTE — Progress Notes (Signed)
[facility] PHYSICAL MEDICINE & REHABILITATION PROGRESS NOTE  Subjective/Complaints: Patient seen laying in bed this morning.  She states she slept well overnight.  She states she had a good first day of therapies yesterday.  She states she is looking forward to her day of rest today.  ROS: Denies CP, shortness of breath, nausea, vomiting, diarrhea.  Objective: Vital Signs: Blood pressure (!) 159/74, pulse 96, temperature 98.7 F (37.1 C), resp. rate 18, height 5\' 4"  (1.626 m), SpO2 93 %. No results found. Recent Labs    10/25/19 0633  WBC 7.4  HGB 10.5*  HCT 33.8*  PLT 337   Recent Labs    10/25/19 0633  NA 141  K 3.9  CL 107  CO2 24  GLUCOSE 103*  BUN 6*  CREATININE 0.71  CALCIUM 8.3*    Physical Exam: BP (!) 159/74 (BP Location: Right Arm)   Pulse 96   Temp 98.7 F (37.1 C)   Resp 18   Ht 5\' 4"  (1.626 m)   SpO2 93%   BMI 48.55 kg/m  Constitutional: No distress . Vital signs reviewed.  Morbidly obese. HENT: Normocephalic.  Atraumatic. Eyes: EOMI. No discharge. Cardiovascular: No JVD. Respiratory: Normal effort.  No stridor. GI: Non-distended. Skin: Warm and dry.  Intact. Psych: Normal mood.  Normal behavior. Musc: No edema in extremities.  No tenderness in extremities. Neuro:  Alert Left facial weakness Motor: RUE/RLE: 4+-5/5 proximal distal, unchanged LUE/LLE: 5/5 proximal distal  Assessment/Plan: 1. Functional deficits secondary to left MCA infarcts which require 3+ hours per day of interdisciplinary therapy in a comprehensive inpatient rehab setting.  Physiatrist is providing close team supervision and 24 hour management of active medical problems listed below.  Physiatrist and rehab team continue to assess barriers to discharge/monitor patient progress toward functional and medical goals  Care Tool:  Bathing    Body parts bathed by patient: Right arm, Left arm, Chest, Abdomen, Front perineal area, Right upper leg, Left upper leg, Face   Body  parts bathed by helper: Buttocks, Right lower leg, Left lower leg     Bathing assist Assist Level: Minimal Assistance - Patient > 75%     Upper Body Dressing/Undressing Upper body dressing   What is the patient wearing?: Pull over shirt    Upper body assist Assist Level: Set up assist    Lower Body Dressing/Undressing Lower body dressing      What is the patient wearing?: Underwear/pull up, Pants     Lower body assist Assist for lower body dressing: Moderate Assistance - Patient 50 - 74%     Toileting Toileting    Toileting assist Assist for toileting: Contact Guard/Touching assist     Transfers Chair/bed transfer  Transfers assist     Chair/bed transfer assist level: Minimal Assistance - Patient > 75%     Locomotion Ambulation   Ambulation assist      Assist level: Minimal Assistance - Patient > 75% Assistive device: Walker-rolling Max distance: 50   Walk 10 feet activity   Assist     Assist level: Minimal Assistance - Patient > 75% Assistive device: Walker-rolling   Walk 50 feet activity   Assist    Assist level: Minimal Assistance - Patient > 75% Assistive device: Walker-rolling    Walk 150 feet activity   Assist Walk 150 feet activity did not occur: Safety/medical concerns         Walk 10 feet on uneven surface  activity   Assist Walk 10 feet on  uneven surfaces activity did not occur: Safety/medical concerns         Wheelchair     Assist Will patient use wheelchair at discharge?: Yes Type of Wheelchair: Manual    Wheelchair assist level: Minimal Assistance - Patient > 75% Max wheelchair distance: 50    Wheelchair 50 feet with 2 turns activity    Assist        Assist Level: Minimal Assistance - Patient > 75%   Wheelchair 150 feet activity     Assist Wheelchair 150 feet activity did not occur: Safety/medical concerns          Medical Problem List and Plan: 1.  Impaired mobility and ADLs  secondary to multiple left frontal and temporal lobe infarcts within the left MCA and additional small acute/subacute infarcts within body of corpus callosum  Continue CIR  2.  Antithrombotics: -DVT/anticoagulation:  Pharmaceutical: Lovenox             -antiplatelet therapy: DAPT x3 weeks followed by Plavix alone 3. Pain Management: Gabapentin, low-dose Elavil and Cymbalta daily.  Oxycodone as needed pain  Controlled on 2/21             Monitor with increased exertion 4. Mood: LCSW to follow for evaluation and support             -antipsychotic agents: N/A 5. Neuropsych: This patient is not capable of making decisions on her own behalf. 6. Skin/Wound Care: Routine pressure relief measures.  7. Fluids/Electrolytes/Nutrition: Monitor I/O. Check lytes in am.  8.  HTN: Monitor blood pressures   Continue Coreg twice daily.   BP/heart rate labile on 2/21, monitor for trend  Monitor with increased mobility 9.  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 Was on Metformin and glipizide prior to admission--resumed  Glipizide decreased to 2.5 twice daily on 2/20  Continue Metformin 500 twice daily  Continue to monitor blood sugars achs.    Titrate medications as indicated.  Labile on 2/21, monitor for trend 10. Anemia:   Hemoglobin 10.5 on 2/20, trending down, labs ordered for tomorrow  Continue to monitor 11.  Hypoalbuminemia  Supplement initiated on 2/20  LOS: 2 days A FACE TO FACE EVALUATION WAS PERFORMED  Regina Wolfe Lorie Phenix 10/26/2019, 9:06 AM

## 2019-10-27 ENCOUNTER — Inpatient Hospital Stay (HOSPITAL_COMMUNITY): Payer: Medicare HMO | Admitting: Occupational Therapy

## 2019-10-27 ENCOUNTER — Inpatient Hospital Stay (HOSPITAL_COMMUNITY): Payer: Medicare HMO | Admitting: Physical Therapy

## 2019-10-27 LAB — BASIC METABOLIC PANEL
Anion gap: 10 (ref 5–15)
BUN: 10 mg/dL (ref 8–23)
CO2: 24 mmol/L (ref 22–32)
Calcium: 8.6 mg/dL — ABNORMAL LOW (ref 8.9–10.3)
Chloride: 108 mmol/L (ref 98–111)
Creatinine, Ser: 0.68 mg/dL (ref 0.44–1.00)
GFR calc Af Amer: >60 mL/min (ref >60)
GFR calc non Af Amer: >60 mL/min (ref >60)
Glucose, Bld: 90 mg/dL (ref 70–99)
Potassium: 3.5 mmol/L (ref 3.5–5.1)
Sodium: 142 mmol/L (ref 135–145)

## 2019-10-27 LAB — URINALYSIS, ROUTINE W REFLEX MICROSCOPIC
Bilirubin Urine: NEGATIVE
Glucose, UA: 50 mg/dL — AB
Ketones, ur: NEGATIVE mg/dL
Nitrite: POSITIVE — AB
Protein, ur: NEGATIVE mg/dL
Specific Gravity, Urine: 1.019 (ref 1.005–1.030)
WBC, UA: >50 WBC/hpf — ABNORMAL HIGH (ref 0–5)
pH: 5 (ref 5.0–8.0)

## 2019-10-27 LAB — CBC
HCT: 32.9 % — ABNORMAL LOW (ref 36.0–46.0)
Hemoglobin: 10.4 g/dL — ABNORMAL LOW (ref 12.0–15.0)
MCH: 25.4 pg — ABNORMAL LOW (ref 26.0–34.0)
MCHC: 31.6 g/dL (ref 30.0–36.0)
MCV: 80.4 fL (ref 80.0–100.0)
Platelets: 393 10*3/uL (ref 150–400)
RBC: 4.09 MIL/uL (ref 3.87–5.11)
RDW: 15.1 % (ref 11.5–15.5)
WBC: 8.5 10*3/uL (ref 4.0–10.5)
nRBC: 0 % (ref 0.0–0.2)

## 2019-10-27 LAB — GLUCOSE, CAPILLARY
Glucose-Capillary: 88 mg/dL (ref 70–99)
Glucose-Capillary: 192 mg/dL — ABNORMAL HIGH (ref 70–99)
Glucose-Capillary: 129 mg/dL — ABNORMAL HIGH (ref 70–99)
Glucose-Capillary: 99 mg/dL (ref 70–99)

## 2019-10-27 NOTE — Progress Notes (Signed)
Occupational Therapy Session Note  Patient Details  Name: Regina Wolfe MRN: [id number] Date of Birth: 10-04-1955  Today's Date: 10/27/2019 OT Individual Time: 1001-1057 OT Individual Time Calculation (min): 56 min   Short Term Goals: Week 1:  OT Short Term Goal 1 (Week 1): STGs=LTGs due to ELOS  Skilled Therapeutic Interventions/Progress Updates:    Pt greeted in the recliner with no c/o pain. She declined shower or bathing at the sink, already toileted and brushed her teeth today. Therefore initiated AE training using reacher and sock aide. It took a significant amount of time for pt to get the hang of using these pieces of equipment. She needed max demonstrational and verbal cues during first few attempts, fading to min vcs for technique and problem solving with repetition. Pt able to don both gripper socks with supervision and also threaded her pull-up over both feet. She lifted them over hips while standing using RW. With supervision assist, pt lost her balance backwards and chair caught her fall. During 2nd attempt to pull up pants, pt provided with CGA for dynamic balance while both UEs were engaged in this ADL task. Discussed DME needs and plans for home. Pt stopped showering at home because of fear of falling, sponge bathed PTA. She has a tub shower and needs a bench. Pt would benefit practicing TTB transfers and also reviewing these transfers with family during family training. Provided her walker with a walker and reacher bag, discussed functional uses at home during household mobility. Pt appreciative. Left her with all needs within reach.     Therapy Documentation Precautions:  Precautions Precautions: Fall Restrictions Weight Bearing Restrictions: No   Therapy Vitals Pulse Rate: (!) 101 BP: 138/72 Patient Position (if appropriate): Sitting Pain: Pain Assessment Pain Scale: 0-10 Pain Score: 5  Pain Type: Neuropathic pain Pain Location: Leg Pain Orientation:  Right;Left Pain Descriptors / Indicators: Aching Pain Frequency: Constant Pain Onset: On-going Patients Stated Pain Goal: 0 Pain Intervention(s): Medication (See eMAR) ADL: ADL Eating: Not assessed Grooming: Contact guard Where Assessed-Grooming: Standing at sink Upper Body Bathing: Setup Where Assessed-Upper Body Bathing: Sitting at sink Lower Body Bathing: Moderate assistance Where Assessed-Lower Body Bathing: Sitting at sink, Standing at sink Upper Body Dressing: Setup Where Assessed-Upper Body Dressing: Sitting at sink Lower Body Dressing: Maximal assistance Where Assessed-Lower Body Dressing: Standing at sink, Sitting at sink Toileting: Contact guard Where Assessed-Toileting: Glass blower/designer: Therapist, music Method: Ambulating(RW) Science writer: Raised toilet seat Tub/Shower Transfer: Not assessed      Therapy/Group: Individual Therapy  Maci Eickholt A Elfrieda Espino 10/27/2019, 12:39 PM

## 2019-10-27 NOTE — IPOC Note (Signed)
Overall Plan of Care [facility]) Patient Details Name: Regina Wolfe MRN: [id number] DOB: 09-08-1955  Admitting Diagnosis: Acute ischemic left MCA stroke [facility])  Hospital Problems: Principal Problem:   Acute ischemic left MCA stroke ([place]) Active Problems:   Hypoalbuminemia due to protein-calorie malnutrition ([place])   Labile blood glucose   Hypoglycemia   Neuropathic pain     Functional Problem List: Nursing Bladder, Edema, Bowel, Endurance, Medication Management, Pain, Safety, Sensory  PT Balance, Edema, Endurance, Motor, Nutrition, Perception, Safety  OT Balance, Safety, Sensory, Vision, Endurance, Motor, Pain  SLP Linguistic  TR         Basic ADL's: OT Grooming, Bathing, Dressing, Toileting     Advanced  ADL's: OT Other (comment)(N/A, pt not responsible for IADLs at home)     Transfers: PT Bed Mobility, Bed to Chair, Car, [employer], Floor  OT Toilet, Tub/Shower     Locomotion: PT Ambulation, Emergency planning/management officer, Stairs     Additional Impairments: OT None  SLP Communication expression    TR      Anticipated Outcomes Item Anticipated Outcome  Self Feeding No goal  Swallowing      Basic self-care  Supervision-Mod I  Insurance underwriter Transfers Supervision  Bowel/Bladder  Mod assist with continence  Transfers  Mod I with LRAD  Locomotion  ambulatory with Supervision assist and LRAD at house hold level  Communication  mod I  Cognition     Pain  <3 on a 0-10 pain scale  Safety/Judgment  mod assist with transfers   Therapy Plan: PT Intensity: Minimum of 1-2 x/day ,45 to 90 minutes PT Frequency: 5 out of 7 days PT Duration Estimated Length of Stay: 6-8 days OT Intensity: Minimum of 1-2 x/day, 45 to 90 minutes OT Frequency: 5 out of 7 days OT Duration/Estimated Length of Stay: 7-10 days SLP Intensity: Minumum of 1-2 x/day, 30 to 90 minutes SLP Frequency: 3 to 5 out of 7 days SLP Duration/Estimated Length of Stay: 7-10 days   Due to  the current state of emergency, patients may not be receiving their 3-hours of Medicare-mandated therapy.   Team Interventions: Nursing Interventions Patient/Family Education, Bladder Management, Medication Management, Discharge Planning, Bowel Management, Skin Care/Wound Management, Disease Management/Prevention  PT interventions Ambulation/gait training, Discharge planning, Functional mobility training, Psychosocial support, Visual/perceptual remediation/compensation, Therapeutic Activities, Wheelchair propulsion/positioning, Therapeutic Exercise, Skin care/wound management, Neuromuscular re-education, Disease management/prevention, Training and development officer, Cognitive remediation/compensation, DME/adaptive equipment instruction, Pain management, Splinting/orthotics, UE/LE Strength taining/ROM, Stair training, UE/LE Coordination activities, Patient/family education, Functional electrical stimulation, Community reintegration  OT Interventions Training and development officer, Discharge planning, Pain management, Self Care/advanced ADL retraining, Therapeutic Activities, UE/LE Coordination activities, Disease mangement/prevention, Functional mobility training, Patient/family education, Therapeutic Exercise, Visual/perceptual remediation/compensation, Academic librarian, Engineer, drilling, Neuromuscular re-education, Psychosocial support, UE/LE Strength taining/ROM  SLP Interventions Cueing hierarchy, Speech/Language facilitation, Patient/family education, Environmental controls, Functional tasks, Internal/external aids  TR Interventions    SW/CM Interventions     Barriers to Discharge MD  Medical stability  Nursing Inaccessible home environment, Decreased caregiver support, Medical stability, Home environment access/layout, Lack of/limited family support, Incontinence, Weight, Medication compliance    PT Inaccessible home environment, Medication compliance, Weight    OT Medical  stability    SLP      SW       Team Discharge Planning: Destination: PT-Home ,OT- Home , SLP-Home Projected Follow-up: PT-Home health PT, OT-  24 hour supervision/assistance, Home health OT, SLP-None Projected Equipment Needs: PT-To be determined, Wheelchair (measurements), OT- To be determined, SLP-None  recommended by SLP Equipment Details: PT- , OT-  Patient/family involved in discharge planning: PT- Patient,  OT-Patient, SLP-Patient  MD ELOS: 7-10d Medical Rehab Prognosis:  Good Assessment:  64 year old right-handed female with history of CVA-2018 with resultant right-sided weakness and mild expressive aphasia, CAD, T2DM with neuropathy, retinal detachment with left eye blindness, cervical cancer 2-week history of recurrent falls with disorientation and B/B incontinence.  She was evaluated in ED in [place] and was diagnosed with TIA but continued to have issues with disorientation.  Family [facility]2019 for work-up.  MRI brain done showing multiple left frontal and temporal lobe infarcts within the left MCA and additional small acute/subacute infarcts within body of corpus callosum.  Echocardiogram showed EF of 50 to 55%.  Stroke felt to be embolic in nature and patient underwent TEE --negative for mass, thrombus or PFO therefore loop recorder placed 02/19 by Dr. Sallyanne Kuster.  ASA added to Plavix with recommendations to continue DAPT x3 weeks followed by Plavix alone.  With resultant left-sided weakness with speech difficulties and mild expressive aphasia.  Now requiring 24/7 Rehab RN,MD, as well as CIR level PT, OT. Treatment team will focus on ADLs and mobility with goals set at Supervision  See Team Conference Notes for weekly updates to the plan of care

## 2019-10-27 NOTE — Care Management (Signed)
Patient Details  Name: Regina Wolfe MRN: [id number] Date of Birth: September 26, 1955  Today's Date: 10/27/2019  Problem List:  Patient Active Problem List   Diagnosis Date Noted  . Hypoalbuminemia due to protein-calorie malnutrition ([place])   . Labile blood glucose   . Hypoglycemia   . Neuropathic pain   . Acute ischemic left MCA stroke ([place]) 10/24/2019  . History of CVA (cerebrovascular accident)   . Diabetic peripheral neuropathy ([place])   . CVA (cerebral vascular accident) ([place]) 10/19/2019  . Tachycardia 10/19/2019  . Essential hypertension 10/19/2019  . Anemia 10/19/2019  . Insulin dependent type 2 diabetes mellitus ([place]) 10/19/2019  . Peripheral neuropathy 10/19/2019  . CAD (coronary artery disease) 10/19/2019   Past Medical History:  Past Medical History:  Diagnosis Date  . Diabetes mellitus without complication ([place])   . Stroke [facility])    Past Surgical History:  Past Surgical History:  Procedure Laterality Date  . ABDOMINAL HYSTERECTOMY     Total  . BUBBLE STUDY  10/22/2019   Procedure: BUBBLE STUDY;  Surgeon: Sanda Klein, MD;  Location: [place];  Service: Cardiovascular;;  . [place]  . LOOP RECORDER INSERTION N/A 10/22/2019   Procedure: LOOP RECORDER INSERTION;  Surgeon: Constance Haw, MD;  Location: [place] CV LAB;  Service: Cardiovascular;  Laterality: N/A;  . Right and Left Eye Surgery for Detached Retinas    . TEE WITHOUT CARDIOVERSION N/A 10/22/2019   Procedure: TRANSESOPHAGEAL ECHOCARDIOGRAM (TEE);  Surgeon: Sanda Klein, MD;  Location: [facility] ENDOSCOPY;  Service: Cardiovascular;  Laterality: N/A;   Social History:  reports that she has quit smoking. Her smoking use included cigarettes. She has never used smokeless tobacco. She reports that she does not drink alcohol or use drugs.  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   Abuse/Neglect Abuse/Neglect Assessment Can Be Completed: Yes Physical Abuse: Denies Verbal Abuse: Denies Sexual Abuse: Denies Exploitation of patient/patient's resources: Denies Self-Neglect: Denies  Emotional Status Pt's affect, behavior and adjustment status: Restricted affect, normal mood and behavior  Patient / Family Perceptions, Expectations & Goals Pt/Family understanding of illness & functional limitations: Patient appears to have a good understanding of her current health issues and functional limitations Premorbid pt/family roles/activities: Family assisted with driving, home care and patient was independent with self care Anticipated changes in roles/activities/participation: Sister and family will provide supervision/assistance as needed Pt/family expectations/goals: Patient would like to be able to get herself dressed and toilet/self care by herself  [employer] available at discharge: Family will provide transportation at discharge  Discharge Planning Living Arrangements: Other relatives Support Systems: Other relatives Type of Residence: Private residence Insurance Resources: Medicare Money Management: Patient, Family Does the patient have any problems obtaining your medications?: No Home Management: Sister manages the home Patient/Family Preliminary Plans: Discharge home with family who can provide assistance as needed Social Work Anticipated Follow Up Needs: HH/OP Expected length of stay: 7-10 days  Clinical Impression The patient appeared surprised but pleased when told she was "doing well", anticipate discharge by the  end of the week. Family education initiated with sister today and will go over information with daughter and sister tomorrow in prep for  discharge home at a supervision level overall. Reported she was "having a hard time not doing for others as she had been a nurse for 38 years; don't know what else to do". The first stroke took her out of work and she has not been able to do for herself as much as she would like. Her sister manages the home, drives, does cleaning, laundry and the patient managed her self care/toileting.  Dorien Chihuahua B 10/27/2019, 5:11 PM

## 2019-10-27 NOTE — Progress Notes (Signed)
Occupational Therapy Session Note  Patient Details  Name: Regina Wolfe MRN: [id number] Date of Birth: 1955/11/19  Today's Date: 10/27/2019 OT Individual Time: 1300-1411 OT Individual Time Calculation (min): 71 min    Short Term Goals: Week 1:  OT Short Term Goal 1 (Week 1): STGs=LTGs due to ELOS  Skilled Therapeutic Interventions/Progress Updates:    Upon entering the room, pt seated in recliner chair with sister present in the room. Pt verbalizing need for toileting and standing with supervision and ambulating into bathroom with RW and close supervision. Pt having accident and needing to change clothing. Pt seated on commode and able to doff soiled clothing with CGA for balance. Pt able to have BM while seated on toilet and needing assistance to thread pants onto B feet but able to pull over B hips herself. Pt standing at sink for hand hygiene. OT reviewing equipment needs and goals with caregiver present in the room. This session used to start hands on caregiver training. Sister provided supervision - CGA for balance with pt ambulating 150' to ADL apartment. Pt demonstrating ability to transfer on and off standard height bed similar to home environment with supervision. OT demonstrating transfer onto TTB with RW and pt returned demonstration with caregiver assist with min guard for safety. Pt and caregiver verbalized a need for TTB at discharge as well as 3 in 1 commode chair secondary to bowel incontinence and fall risk. Pt ambulating back to room at end of session in same manner as above. Family education set up for hands on with other disciplines tomorrow. Pt seated in recliner chair with call bell and all needed items within reach. Chair alarm activated.   Therapy Documentation Precautions:  Precautions Precautions: Fall Restrictions Weight Bearing Restrictions: No   ADL: ADL Eating: Not assessed Grooming: Contact guard Where Assessed-Grooming: Standing at sink Upper Body Bathing:  Setup Where Assessed-Upper Body Bathing: Sitting at sink Lower Body Bathing: Moderate assistance Where Assessed-Lower Body Bathing: Sitting at sink, Standing at sink Upper Body Dressing: Setup Where Assessed-Upper Body Dressing: Sitting at sink Lower Body Dressing: Maximal assistance Where Assessed-Lower Body Dressing: Standing at sink, Sitting at sink Toileting: Contact guard Where Assessed-Toileting: Glass blower/designer: Therapist, music Method: Ambulating(RW) Science writer: Raised toilet seat Tub/Shower Transfer: Not assessed   Therapy/Group: Individual Therapy  [name] 10/27/2019, 2:40 PM

## 2019-10-27 NOTE — Care Management (Signed)
Inpatient Rehabilitation Center Individual Statement of Services  Patient Name:  Regina Wolfe  Date:  10/27/2019  Welcome to the [facility].  Our goal is to provide you with an individualized program based on your diagnosis and situation, designed to meet your specific needs.  With this comprehensive rehabilitation program, you will be expected to participate in at least 3 hours of rehabilitation therapies Monday-Friday, with modified therapy programming on the weekends.  Your rehabilitation program will include the following services:  Physical Therapy (PT), Occupational Therapy (OT), Speech Therapy (ST), 24 hour per day rehabilitation nursing, Therapeutic Recreaction (TR), Neuropsychology, Case Management (Social Worker), Rehabilitation Medicine, Nutrition Services and Pharmacy Services  Weekly team conferences will be held on Wednesday to discuss your progress.  Your Social Industrial/product designer will talk with you frequently to get your input and to update you on team discussions.  Team conferences with you and your family in attendance may also be held.  Expected length of stay: 7-10 days  Overall anticipated outcome: Supervision overall  Depending on your progress and recovery, your program may change. Your Social Industrial/product designer will coordinate services and will keep you informed of any changes. Your Social Worker's/Care Manager's name and contact numbers are listed  below.  The following services may also be recommended but are not provided by the [facility]:   [place] will be made to provide these services after discharge if needed.  Arrangements include referral to agencies that provide these services.  Your insurance has been verified to be:  [employer] Your primary doctor is:  Joaquim Lai MD  Pertinent information will be shared with your doctor and your  insurance company.  Social Worker:  Loralee Pacas, [place] or [phone] Care Manager: Dorien Chihuahua [phone] or (C) [phone]  Information discussed with and copy given to patient by: Margarito Liner, 10/27/2019, 4:54 PM

## 2019-10-27 NOTE — Progress Notes (Signed)
Physical Therapy Session Note  Patient Details  Name: Regina Wolfe MRN: [id number] Date of Birth: 1956/05/29  Today's Date: 10/27/2019 PT Individual Time: 0805-0903 PT Individual Time Calculation (min): 58 min   Short Term Goals: Week 1:  PT Short Term Goal 1 (Week 1): STG=LTG due to ELOS  Skilled Therapeutic Interventions/Progress Updates: Pt presented in bed agreeable to therapy. Pt stating some "stiffness and soreness" in BLE but no rated and no intervention needed per pt. Pt performed bed mobility to sitting EOB with supervision and use of bed features. At EOB pt completed breakfast. Pt ambulated to bathroom with RW and close S and performed toilet transfers with supervision (+void). Pt performed peri-care in standing with RW and supervision. Returned to sitting and donned shirt and pants with minA for time management. Pt then ambulated to sink and performed oral hygiene at sink without AD. Afterwards PTA encouraged therapeutic break in w/c and provided edu on energy conservation with daily activities. Pt then transported to rehab gym and performed stand pivot transfer to mat supervision. Participated in STS x 5 no AD for BLE strengthening, toe taps to 4in step with RW 2 x 10 for wt shifting and balance and x 1 bout of horseshoes no AD with emphasis on crossing midline and reaching outside BOS for balance. Pt noted to have no LOB with activities. Pt then ambulated back to room at end of session with CGA fading to close S with RW and w/c follow. Pt agreeable to sit in recliner at end of session and left with call bell within reach and NT present for room set up.      Therapy Documentation Precautions:  Precautions Precautions: Fall Restrictions Weight Bearing Restrictions: No General:   Vital Signs: Therapy Vitals Pulse Rate: (!) 101 BP: 138/72 Patient Position (if appropriate): Sitting Pain: Pain Assessment Pain Scale: 0-10 Pain Score: 5  Pain Type: Neuropathic pain Pain  Location: Leg Pain Orientation: Right;Left Pain Descriptors / Indicators: Aching Pain Frequency: Constant Pain Onset: On-going Patients Stated Pain Goal: 0 Pain Intervention(s): Medication (See eMAR)    Therapy/Group: Individual Therapy  Farzad Tibbetts  Athol Bolds, PTA  10/27/2019, 12:29 PM

## 2019-10-27 NOTE — Progress Notes (Signed)
Inpatient Rehabilitation  Patient information reviewed and entered into eRehab system by Ferguson Gertner M. Rikia Sukhu, M.A., CCC/SLP, PPS Coordinator.  Information including medical coding, functional ability and quality indicators will be reviewed and updated through discharge.    

## 2019-10-27 NOTE — Progress Notes (Signed)
[facility] PHYSICAL MEDICINE & REHABILITATION PROGRESS NOTE  Subjective/Complaints:  No issues overnite  C/o freq urination and burning , has hx of UTI  ROS: Denies CP, shortness of breath, nausea, vomiting, diarrhea.  Objective: Vital Signs: Blood pressure (!) 158/78, pulse 95, temperature 98.9 F (37.2 C), temperature source Oral, resp. rate 18, height 5\' 4"  (1.626 m), SpO2 97 %. No results found. Recent Labs    10/25/19 0633 10/27/19 0512  WBC 7.4 8.5  HGB 10.5* 10.4*  HCT 33.8* 32.9*  PLT 337 393   Recent Labs    10/25/19 0633 10/27/19 0512  NA 141 142  K 3.9 3.5  CL 107 108  CO2 24 24  GLUCOSE 103* 90  BUN 6* 10  CREATININE 0.71 0.68  CALCIUM 8.3* 8.6*    Physical Exam: BP (!) 158/78 (BP Location: Right Arm)   Pulse 95   Temp 98.9 F (37.2 C) (Oral)   Resp 18   Ht 5\' 4"  (1.626 m)   SpO2 97%   BMI 48.55 kg/m  Constitutional: No distress . Vital signs reviewed.  Morbidly obese. HENT: Normocephalic.  Atraumatic. Eyes: EOMI. No discharge. Cardiovascular: No JVD. Respiratory: Normal effort.  No stridor. GI: Non-distended. Skin: Warm and dry.  Intact. Psych: Normal mood.  Normal behavior. Musc: No edema in extremities.  No tenderness in extremities. Neuro:  Alert Left facial weakness Motor: RUE/RLE: 4+-5/5 proximal distal, unchanged LUE/LLE: 5/5 proximal distal  Assessment/Plan: 1. Functional deficits secondary to left MCA infarcts which require 3+ hours per day of interdisciplinary therapy in a comprehensive inpatient rehab setting.  Physiatrist is providing close team supervision and 24 hour management of active medical problems listed below.  Physiatrist and rehab team continue to assess barriers to discharge/monitor patient progress toward functional and medical goals  Care Tool:  Bathing    Body parts bathed by patient: Right arm, Left arm, Chest, Abdomen, Front perineal area, Right upper leg, Left upper leg, Face   Body parts bathed by  helper: Buttocks, Right lower leg, Left lower leg     Bathing assist Assist Level: Minimal Assistance - Patient > 75%     Upper Body Dressing/Undressing Upper body dressing   What is the patient wearing?: Pull over shirt    Upper body assist Assist Level: Set up assist    Lower Body Dressing/Undressing Lower body dressing      What is the patient wearing?: Underwear/pull up     Lower body assist Assist for lower body dressing: Moderate Assistance - Patient 50 - 74%     Toileting Toileting    Toileting assist Assist for toileting: Contact Guard/Touching assist     Transfers Chair/bed transfer  Transfers assist     Chair/bed transfer assist level: Minimal Assistance - Patient > 75%     Locomotion Ambulation   Ambulation assist      Assist level: Minimal Assistance - Patient > 75% Assistive device: Walker-rolling Max distance: 50   Walk 10 feet activity   Assist     Assist level: Minimal Assistance - Patient > 75% Assistive device: Walker-rolling   Walk 50 feet activity   Assist    Assist level: Minimal Assistance - Patient > 75% Assistive device: Walker-rolling    Walk 150 feet activity   Assist Walk 150 feet activity did not occur: Safety/medical concerns         Walk 10 feet on uneven surface  activity   Assist Walk 10 feet on uneven surfaces activity did not  occur: Safety/medical concerns         Wheelchair     Assist Will patient use wheelchair at discharge?: Yes Type of Wheelchair: Manual    Wheelchair assist level: Minimal Assistance - Patient > 75% Max wheelchair distance: 50    Wheelchair 50 feet with 2 turns activity    Assist        Assist Level: Minimal Assistance - Patient > 75%   Wheelchair 150 feet activity     Assist Wheelchair 150 feet activity did not occur: Safety/medical concerns          Medical Problem List and Plan: 1.  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  Antithrombotics: -DVT/anticoagulation:  Pharmaceutical: Lovenox             -antiplatelet therapy: DAPT x3 weeks followed by Plavix alone 3. Pain Management: Gabapentin, low-dose Elavil and Cymbalta daily.  Oxycodone as needed pain  Controlled on 2/21             Monitor with increased exertion 4. Mood: LCSW to follow for evaluation and support             -antipsychotic agents: N/A 5. Neuropsych: This patient is not capable of making decisions on her own behalf. 6. Skin/Wound Care: Routine pressure relief measures.  7. Fluids/Electrolytes/Nutrition: Monitor I/O. Check lytes in am.  8.  HTN: Monitor blood pressures   Continue Coreg twice daily.   BP/heart rate labile on 2/21, monitor for trend Vitals:   10/26/19 0411 10/26/19 1332  BP: (!) 159/74 (!) 158/78  Pulse: 96 95  Resp: 18 18  Temp: 98.7 F (37.1 C) 98.9 F (37.2 C)  SpO2: 93% 97%   9.  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 Was on Metformin and glipizide prior to admission--resumed  Glipizide decreased to 2.5 twice daily on 2/20  Continue Metformin 500 twice daily  Continue to monitor blood sugars achs.    Titrate medications as indicated. CBG (last 3)  Recent Labs    10/26/19 1656 10/26/19 2104 10/27/19 0605  GLUCAP 154* 123* 88  Controlled 2/22  10. Anemia:   Hemoglobin 10.5 on 2/20,stable at 10.4 on 2/22  Continue to monitor 11.  Hypoalbuminemia  Supplement initiated on 2/20 12.  Freq urination , dysuria, hx UTI will check UA C and S , UA + nitrite from 2/16, no C and S, no abx LOS: 3 days A FACE TO FACE EVALUATION WAS PERFORMED  Charlett Blake 10/27/2019, 7:47 AM

## 2019-10-27 NOTE — Plan of Care (Signed)
  Problem: Consults Goal: RH STROKE PATIENT EDUCATION Description: See Patient Education module for education specifics  Outcome: Progressing Goal: Diabetes Guidelines if Diabetic/Glucose > 140 Description: If diabetic or lab glucose is > 140 mg/dl - Initiate Diabetes/Hyperglycemia Guidelines & Document Interventions  Outcome: Progressing   Problem: RH BOWEL ELIMINATION Goal: RH STG MANAGE BOWEL WITH ASSISTANCE Description: STG Manage Bowel with mod Assistance. Outcome: Progressing Goal: RH STG MANAGE BOWEL W/MEDICATION W/ASSISTANCE Description: STG Manage Bowel with Medication with min Assistance. Outcome: Progressing   Problem: RH BLADDER ELIMINATION Goal: RH STG MANAGE BLADDER WITH ASSISTANCE Description: STG Manage Bladder With mod Assistance Outcome: Progressing   Problem: RH SKIN INTEGRITY Goal: RH STG MAINTAIN SKIN INTEGRITY WITH ASSISTANCE Description: STG Maintain Skin Integrity With min Assistance. Outcome: Progressing Goal: RH STG ABLE TO PERFORM INCISION/WOUND CARE W/ASSISTANCE Description: STG Able To Perform Skin Care With min Assistance. Outcome: Progressing

## 2019-10-28 ENCOUNTER — Ambulatory Visit (HOSPITAL_COMMUNITY): Payer: Medicare HMO | Admitting: Physical Therapy

## 2019-10-28 ENCOUNTER — Inpatient Hospital Stay (HOSPITAL_COMMUNITY): Payer: Medicare HMO | Admitting: Occupational Therapy

## 2019-10-28 ENCOUNTER — Encounter (HOSPITAL_COMMUNITY): Payer: Medicare HMO | Admitting: Speech Pathology

## 2019-10-28 LAB — GLUCOSE, CAPILLARY
Glucose-Capillary: 106 mg/dL — ABNORMAL HIGH (ref 70–99)
Glucose-Capillary: 233 mg/dL — ABNORMAL HIGH (ref 70–99)
Glucose-Capillary: 189 mg/dL — ABNORMAL HIGH (ref 70–99)
Glucose-Capillary: 80 mg/dL (ref 70–99)

## 2019-10-28 MED ORDER — CEPHALEXIN 250 MG PO CAPS
250.0000 mg | ORAL_CAPSULE | Freq: Three times a day (TID) | ORAL | Status: DC
  Administered 2019-10-28 – 2019-10-30 (×7): 250 mg via ORAL
  Filled 2019-10-28 (×7): qty 1

## 2019-10-28 NOTE — Progress Notes (Signed)
[place] PHYSICAL MEDICINE & REHABILITATION PROGRESS NOTE  Subjective/Complaints: Somnolent, not ready for breakfast yet   ROS: Denies CP, shortness of breath, nausea, vomiting, diarrhea.  Objective: Vital Signs: Blood pressure (!) 143/75, pulse (!) 104, temperature 98.6 F (37 C), resp. rate 18, height 5\' 4"  (1.626 m), weight 126.7 kg, SpO2 100 %. No results found. Recent Labs    10/27/19 0512  WBC 8.5  HGB 10.4*  HCT 32.9*  PLT 393   Recent Labs    10/27/19 0512  NA 142  K 3.5  CL 108  CO2 24  GLUCOSE 90  BUN 10  CREATININE 0.68  CALCIUM 8.6*    Physical Exam: BP (!) 143/75 (BP Location: Left Arm)   Pulse (!) 104   Temp 98.6 F (37 C)   Resp 18   Ht 5\' 4"  (1.626 m)   Wt 126.7 kg   SpO2 100%   BMI 47.94 kg/m  Constitutional: No distress . Vital signs reviewed.  Morbidly obese. HENT: Normocephalic.  Atraumatic. Eyes: EOMI. No discharge. Cardiovascular: No JVD. Respiratory: Normal effort.  No stridor. GI: Non-distended. Skin: Warm and dry.  Intact. Psych: Normal mood.  Normal behavior. Musc: No edema in extremities.  No tenderness in extremities. Neuro:  somnolent  Left facial weakness   Assessment/Plan: 1. Functional deficits secondary to left MCA infarcts which require 3+ hours per day of interdisciplinary therapy in a comprehensive inpatient rehab setting.  Physiatrist is providing close team supervision and 24 hour management of active medical problems listed below.  Physiatrist and rehab team continue to assess barriers to discharge/monitor patient progress toward functional and medical goals  Care Tool:  Bathing    Body parts bathed by patient: Right arm, Left arm, Chest, Abdomen, Front perineal area, Right upper leg, Left upper leg, Face   Body parts bathed by helper: Buttocks, Right lower leg, Left lower leg     Bathing assist Assist Level: Minimal Assistance - Patient > 75%     Upper Body Dressing/Undressing Upper body dressing    What is the patient wearing?: Pull over shirt    Upper body assist Assist Level: Set up assist    Lower Body Dressing/Undressing Lower body dressing      What is the patient wearing?: Pants     Lower body assist Assist for lower body dressing: Minimal Assistance - Patient > 75%     Toileting Toileting    Toileting assist Assist for toileting: Contact Guard/Touching assist     Transfers Chair/bed transfer  Transfers assist     Chair/bed transfer assist level: Supervision/Verbal cueing     Locomotion Ambulation   Ambulation assist      Assist level: Minimal Assistance - Patient > 75% Assistive device: Walker-rolling Max distance: 150'   Walk 10 feet activity   Assist     Assist level: Minimal Assistance - Patient > 75% Assistive device: Walker-rolling   Walk 50 feet activity   Assist    Assist level: Minimal Assistance - Patient > 75% Assistive device: Walker-rolling    Walk 150 feet activity   Assist Walk 150 feet activity did not occur: Safety/medical concerns         Walk 10 feet on uneven surface  activity   Assist Walk 10 feet on uneven surfaces activity did not occur: Safety/medical concerns         Wheelchair     Assist Will patient use wheelchair at discharge?: Yes Type of Wheelchair: Agricultural engineer  assist level: Minimal Assistance - Patient > 75% Max wheelchair distance: 50    Wheelchair 50 feet with 2 turns activity    Assist        Assist Level: Minimal Assistance - Patient > 75%   Wheelchair 150 feet activity     Assist     Assist Level: Maximal Assistance - Patient 25 - 49%      Medical Problem List and Plan: 1.  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  Antithrombotics: -DVT/anticoagulation:  Pharmaceutical:  Lovenox             -antiplatelet therapy: DAPT x3 weeks followed by Plavix alone 3. Pain Management: Gabapentin, low-dose Elavil and Cymbalta daily.  Oxycodone as needed pain  Controlled on 2/21             Monitor with increased exertion 4. Mood: LCSW to follow for evaluation and support             -antipsychotic agents: N/A 5. Neuropsych: This patient is not capable of making decisions on her own behalf. 6. Skin/Wound Care: Routine pressure relief measures.  7. Fluids/Electrolytes/Nutrition: Monitor I/O. Check lytes in am.  8.  HTN: Monitor blood pressures   Continue Coreg twice daily.   BP/heart rate labile on 2/21, monitor for trend Vitals:   10/27/19 1448 10/27/19 2001  BP: (!) 152/82 (!) 143/75  Pulse: 100 (!) 104  Resp: 20 18  Temp: 98.2 F (36.8 C) 98.6 F (37 C)  SpO2: 99% 100%   9.  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 Was on Metformin and glipizide prior to admission--resumed  Glipizide decreased to 2.5 twice daily on 2/20  Continue Metformin 500 twice daily  Continue to monitor blood sugars achs.    Titrate medications as indicated. CBG (last 3)  Recent Labs    10/27/19 1725 10/27/19 2124 10/28/19 0601  GLUCAP 129* 99 106*  Controlled 2/23 10. Anemia:   Hemoglobin 10.5 on 2/20,stable at 10.4 on 2/22  Continue to monitor 11.  Hypoalbuminemia  Supplement initiated on 2/20 12.  Freq urination , dysuria, + UA, start Keflex    LOS: 4 days A FACE TO FACE EVALUATION WAS PERFORMED  Charlett Blake 10/28/2019, 7:51 AM

## 2019-10-28 NOTE — Progress Notes (Signed)
Speech Language Pathology Daily Session Note  Patient Details  Name: Regina Wolfe MRN: [id number] Date of Birth: 05/10/1956  Today's Date: 10/28/2019 SLP Individual Time: 1033-1128 SLP Individual Time Calculation (min): 55 min  Short Term Goals: Week 1: SLP Short Term Goal 1 (Week 1): STG=LTG due to ELOS  Skilled Therapeutic Interventions: Pt was seen for skilled ST targeting education with pt and her daughter. SLP facilitated session regarding d/c plans and ST goals while inpatient. Pt able to recall 1 word finding strategy previously discussed with ST as well as location of handout provided with additional strategies. SLP reviewed and added more strategies to handout, and demonstrated concrete examples of cues for daughter to assist with communication at home. Pt reports word-finding issues impacting her less and less as time passes. Daughter reported perception that word-finding difficulties increase in times of lethargy, discussed aphasia/word finding VS language of confusion and ways to remediate communication breakdowns.  SLP placed emphasis on proving pt with extra time for processing and use of strategies prior to family jumping in to finish sentences, semantic and phonemic cues, describing, substituting, and the potential to have to cease conversation and revisit topics later if total communication breakdown occurs. Pt left sitting in recliner with daughter still present, needs met and questions answered to their satisfaction. Continue per current plan of care.        Pain Pain Assessment Pain Scale: Faces Faces Pain Scale: No hurt   Therapy/Group: Individual Therapy   E  10/28/2019, 11:21 AM   

## 2019-10-28 NOTE — Plan of Care (Signed)
  Problem: Consults Goal: RH STROKE PATIENT EDUCATION Description: See Patient Education module for education specifics  Outcome: Progressing Goal: Diabetes Guidelines if Diabetic/Glucose > 140 Description: If diabetic or lab glucose is > 140 mg/dl - Initiate Diabetes/Hyperglycemia Guidelines & Document Interventions  Outcome: Progressing   Problem: RH BOWEL ELIMINATION Goal: RH STG MANAGE BOWEL WITH ASSISTANCE Description: STG Manage Bowel with mod Assistance. Outcome: Progressing Goal: RH STG MANAGE BOWEL W/MEDICATION W/ASSISTANCE Description: STG Manage Bowel with Medication with min Assistance. Outcome: Progressing   Problem: RH BLADDER ELIMINATION Goal: RH STG MANAGE BLADDER WITH ASSISTANCE Description: STG Manage Bladder With mod Assistance Outcome: Progressing

## 2019-10-28 NOTE — Progress Notes (Signed)
Occupational Therapy Session Note  Patient Details  Name: Regina Wolfe MRN: [id number] Date of Birth: [birth year]  Today's Date: 10/28/2019 OT Individual Time: [id number] Individual Time Calculation (min): 55 min    Short Term Goals: Week 1:  OT Short Term Goal 1 (Week 1): STGs=LTGs due to ELOS   Skilled Therapeutic Interventions/Progress Updates:    Upon entering the room, pt supine in bed with RN present with medications. Pt performing bed mobility with supervision. Pt standing with supervision and ambulating to bathroom with min guard progressing to supervision with use of RW. Pt having BM and performed hygiene with encouragement and CGA for balance. Pt seated in wheelchair at sink for bathing and dressing tasks with focus on sit <>stand. Stands from wheelchair with supervision and CGA when leaning forward to thread clothing onto B LEs and for balance while pulling clothing over B hips. Pt ambulating with close supervision to recliner chair at end of session with RW. Pt reports feeling very fatigued at end of session. Energy conservation education started yesterday and continued during this session. Paper handouts left for family to read over as well. Chair alarm activated and call bell within reach.   Therapy Documentation Precautions:  Precautions Precautions: Fall Restrictions Weight Bearing Restrictions: No General:   Vital Signs: Therapy Vitals Pulse Rate: (!) 102 BP: (!) 135/92 Patient Position (if appropriate): Sitting Pain:   ADL: ADL Eating: Not assessed Grooming: Contact guard Where Assessed-Grooming: Standing at sink Upper Body Bathing: Setup Where Assessed-Upper Body Bathing: Sitting at sink Lower Body Bathing: Moderate assistance Where Assessed-Lower Body Bathing: Sitting at sink, Standing at sink Upper Body Dressing: Setup Where Assessed-Upper Body Dressing: Sitting at sink Lower Body Dressing: Maximal assistance Where Assessed-Lower Body Dressing:  Standing at sink, Sitting at sink Toileting: Contact guard Where Assessed-Toileting: Glass blower/designer: Therapist, music Method: Ambulating(RW) Science writer: Raised toilet seat Tub/Shower Transfer: Not assessed Vision   Perception    Praxis   Exercises:   Other Treatments:     Therapy/Group: Individual Therapy  [name] 10/28/2019, 9:58 AM

## 2019-10-28 NOTE — Progress Notes (Signed)
Physical Therapy Session Note  Patient Details  Name: Regina Wolfe MRN: [id number] Date of Birth: 10/14/1955  Today's Date: 10/28/2019 PT Individual Time: 4562-5638   Short Term Goals: Week 1:  PT Short Term Goal 1 (Week 1): STG=LTG due to ELOS  Skilled Therapeutic Interventions/Progress Updates: Pt presented in recliner with dgt present agreeable to therapy. Pt c/o general soreness however no intervention requested. Pt performed STS from recliner with supervision and ambulated with RW supervision to w/c. Pt transported to rehab gym for energy conservation and performed stairs x 4 with CGA. Pt performed first step with 1 rail sidestepping to simulate home environment then ascended additional x 3 steps with B rail for general strengthening. Performed stairs with step to pattern. Pt then performed step with RW to simulate daughter's home with CGA and increased time x 2. Pt requesting to return to stairs and ascended/descended again with B rails x 4 with daughter provided CGA. Daughter also providing close supervision with short distance ambulation and was able to provide appropriate cues when needed. PTA provided edu on CGA vs supervision and goal is for supervision upon d/c. Pt then performed gait on uneven surface (mat with bean bags underneath it) with CGA. Pt with one episode of posterior lean when stepping on mat but was able to correct with min cues. Pt then participated in car transfer from SUV height with RW and supervision. PTA providing extensive education on guarding, energy conservation, and safety with RW throughout session with pt and daughter demonstrating good understanding. Pt transported back to room at end of session and performed ambulatory transfer to recliner supervision. Pt left in recliner at end of session with call bell and needs met.      Therapy Documentation Precautions:  Precautions Precautions: Fall Restrictions Weight Bearing Restrictions: No General:   Vital  Signs: Therapy Vitals Temp: 98 F (36.7 C) Temp Source: Oral Pulse Rate: 91 Resp: 20 BP: 127/66 Patient Position (if appropriate): Sitting Oxygen Therapy SpO2: 94 % O2 Device: Room Air    Therapy/Group: Individual Therapy  Payslee Bateson  Jiovanni Heeter, PTA  10/28/2019, 4:09 PM

## 2019-10-28 NOTE — Progress Notes (Signed)
[facility] PHYSICAL MEDICINE & REHABILITATION PROGRESS NOTE  Subjective/Complaints:    ROS: Denies CP, shortness of breath, nausea, vomiting, diarrhea.  Objective: Vital Signs: Blood pressure (!) 143/75, pulse (!) 104, temperature 98.6 F (37 C), resp. rate 18, height 5\' 4"  (1.626 m), weight 126.7 kg, SpO2 100 %. No results found. Recent Labs    10/27/19 0512  WBC 8.5  HGB 10.4*  HCT 32.9*  PLT 393   Recent Labs    10/27/19 0512  NA 142  K 3.5  CL 108  CO2 24  GLUCOSE 90  BUN 10  CREATININE 0.68  CALCIUM 8.6*    Physical Exam: BP (!) 143/75 (BP Location: Left Arm)   Pulse (!) 104   Temp 98.6 F (37 C)   Resp 18   Ht 5\' 4"  (1.626 m)   Wt 126.7 kg   SpO2 100%   BMI 47.94 kg/m  Constitutional: No distress . Vital signs reviewed.  Morbidly obese. HENT: Normocephalic.  Atraumatic. Eyes: EOMI. No discharge. Cardiovascular: No JVD. Respiratory: Normal effort.  No stridor. GI: Non-distended. Skin: Warm and dry.  Intact. Psych: Normal mood.  Normal behavior. Musc: No edema in extremities.  No tenderness in extremities. Neuro:  Alert Left facial weakness Motor: RUE/RLE: 4+-5/5 proximal distal, unchanged LUE/LLE: 5/5 proximal distal  Assessment/Plan: 1. Functional deficits secondary to left MCA infarcts which require 3+ hours per day of interdisciplinary therapy in a comprehensive inpatient rehab setting.  Physiatrist is providing close team supervision and 24 hour management of active medical problems listed below.  Physiatrist and rehab team continue to assess barriers to discharge/monitor patient progress toward functional and medical goals  Care Tool:  Bathing    Body parts bathed by patient: Right arm, Left arm, Chest, Abdomen, Front perineal area, Right upper leg, Left upper leg, Face   Body parts bathed by helper: Buttocks, Right lower leg, Left lower leg     Bathing assist Assist Level: Minimal Assistance - Patient > 75%     Upper Body  Dressing/Undressing Upper body dressing   What is the patient wearing?: Pull over shirt    Upper body assist Assist Level: Set up assist    Lower Body Dressing/Undressing Lower body dressing      What is the patient wearing?: Pants     Lower body assist Assist for lower body dressing: Minimal Assistance - Patient > 75%     Toileting Toileting    Toileting assist Assist for toileting: Contact Guard/Touching assist     Transfers Chair/bed transfer  Transfers assist     Chair/bed transfer assist level: Supervision/Verbal cueing     Locomotion Ambulation   Ambulation assist      Assist level: Minimal Assistance - Patient > 75% Assistive device: Walker-rolling Max distance: 150'   Walk 10 feet activity   Assist     Assist level: Minimal Assistance - Patient > 75% Assistive device: Walker-rolling   Walk 50 feet activity   Assist    Assist level: Minimal Assistance - Patient > 75% Assistive device: Walker-rolling    Walk 150 feet activity   Assist Walk 150 feet activity did not occur: Safety/medical concerns         Walk 10 feet on uneven surface  activity   Assist Walk 10 feet on uneven surfaces activity did not occur: Safety/medical concerns         Wheelchair     Assist Will patient use wheelchair at discharge?: Yes Type of Wheelchair: Manual  Wheelchair assist level: Minimal Assistance - Patient > 75% Max wheelchair distance: 50    Wheelchair 50 feet with 2 turns activity    Assist        Assist Level: Minimal Assistance - Patient > 75%   Wheelchair 150 feet activity     Assist     Assist Level: Maximal Assistance - Patient 25 - 49%      Medical Problem List and Plan: 1.  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   Antithrombotics: -DVT/anticoagulation:  Pharmaceutical: Lovenox             -antiplatelet therapy: DAPT x3 weeks followed by Plavix alone 3. Pain Management: Gabapentin, low-dose Elavil and Cymbalta daily.  Oxycodone as needed pain  Controlled on 2/21             Monitor with increased exertion 4. Mood: LCSW to follow for evaluation and support             -antipsychotic agents: N/A 5. Neuropsych: This patient is not capable of making decisions on her own behalf. 6. Skin/Wound Care: Routine pressure relief measures.  7. Fluids/Electrolytes/Nutrition: Monitor I/O. Check lytes in am.  8.  HTN: Monitor blood pressures   Continue Coreg twice daily.On max dose , consider adding cardizem  Mild sys elevation monitor for now Vitals:   10/27/19 1448 10/27/19 2001  BP: (!) 152/82 (!) 143/75  Pulse: 100 (!) 104  Resp: 20 18  Temp: 98.2 F (36.8 C) 98.6 F (37 C)  SpO2: 99% 100%   9.  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 Was on Metformin and glipizide prior to admission--resumed  Glipizide decreased to 2.5 twice daily on 2/20  Continue Metformin 500 twice daily  Continue to monitor blood sugars achs.    Titrate medications as indicated. CBG (last 3)  Recent Labs    10/27/19 1725 10/27/19 2124 10/28/19 0601  GLUCAP 129* 99 106*  Controlled 2/23  10. Anemia:   Hemoglobin 10.5 on 2/20,stable at 10.4 on 2/22  Continue to monitor 11.  Hypoalbuminemia  Supplement initiated on 2/20 12.  Freq urination , dysuria, hx UTI will check UA C and S , UA + nitrite from 2/16, no C and S, no abx LOS: 4 days A FACE TO FACE EVALUATION WAS PERFORMED  Charlett Blake 10/28/2019, 7:37 AM

## 2019-10-29 ENCOUNTER — Inpatient Hospital Stay (HOSPITAL_COMMUNITY): Payer: Medicare HMO | Admitting: Physical Therapy

## 2019-10-29 ENCOUNTER — Inpatient Hospital Stay (HOSPITAL_COMMUNITY): Payer: Medicare HMO | Admitting: Speech Pathology

## 2019-10-29 ENCOUNTER — Inpatient Hospital Stay (HOSPITAL_COMMUNITY): Payer: Medicare HMO | Admitting: Occupational Therapy

## 2019-10-29 LAB — URINE CULTURE: Culture: >=100000 — AB

## 2019-10-29 LAB — GLUCOSE, CAPILLARY
Glucose-Capillary: 92 mg/dL (ref 70–99)
Glucose-Capillary: 254 mg/dL — ABNORMAL HIGH (ref 70–99)
Glucose-Capillary: 102 mg/dL — ABNORMAL HIGH (ref 70–99)
Glucose-Capillary: 117 mg/dL — ABNORMAL HIGH (ref 70–99)

## 2019-10-29 NOTE — Progress Notes (Signed)
Occupational Therapy Discharge Summary  Patient Details  Name: Regina Wolfe MRN: [id number] Date of Birth: 16-Mar-1956  Today's Date: 10/29/2019 OT Individual Time: 2703-5009 OT Individual Time Calculation (min): 72 min    Patient has met 9 of 9 long term goals due to improved activity tolerance, improved balance, postural control, ability to compensate for deficits, improved awareness and improved coordination.  Patient to discharge at overall Supervision level.  Patient's care partner is independent to provide the necessary supervision assistance at discharge.    Reasons goals not met: all goals met  Recommendation:  Patient will benefit from ongoing skilled OT services in home health setting to continue to advance functional skills in the area of BADL and iADL.  Equipment: TTB and drop arm commode chair  Reasons for discharge: treatment goals met  Patient/family agrees with progress made and goals achieved: Yes   OT Intervention: Upon entering the room, pt seated in recliner chair with sister present for observation. Pt agreeable to shower and ambulating with RW to dresser to obtain clothing items from bottom drawer. Pt squatting to reach low drawer without LOB. Pt ambulating further into bathroom for toileting needs at overall supervision for hygiene and doffing clothing items while seated on commode. Pt seated on TTB for bathing at supervision level and min cuing for safety. Pt transferred onto commode for dressing as she plans on doing this in home environment. UB dressing independently and LB dressing with supervision for sit <>stand. Pt ambulating to closet to place dirty clothing and then returning to sit in recliner chair in same manner as above. OT answering additional questions pt and family had regarding discharge, stroke signs/symptoms, and follow up recommendations. Chair alarm activated and call bell within reach upon exiting the room.    OT  Discharge Precautions/Restrictions  Precautions Precautions: Fall Restrictions Weight Bearing Restrictions: No Vital Signs Therapy Vitals Temp: 98.4 F (36.9 C) Pulse Rate: (!) 103 Resp: 20 BP: 117/83 Patient Position (if appropriate): Sitting Oxygen Therapy SpO2: 98 % O2 Device: Room Air Pain Pain Assessment Pain Scale: 0-10 Pain Score: 0-No pain ADL ADL Eating: Not assessed Grooming: Contact guard Where Assessed-Grooming: Standing at sink Upper Body Bathing: Setup Where Assessed-Upper Body Bathing: Sitting at sink Lower Body Bathing: Moderate assistance Where Assessed-Lower Body Bathing: Sitting at sink, Standing at sink Upper Body Dressing: Setup Where Assessed-Upper Body Dressing: Sitting at sink Lower Body Dressing: Maximal assistance Where Assessed-Lower Body Dressing: Standing at sink, Sitting at sink Toileting: Contact guard Where Assessed-Toileting: Glass blower/designer: Therapist, music Method: Ambulating(RW) Science writer: Raised toilet seat Tub/Shower Transfer: Not assessed Vision Baseline Vision/History: Cataracts;Legally blind Patient Visual Report: Blurring of vision Vision Assessment?: Vision impaired- to be further tested in functional context Perception  Perception: Impaired Praxis Praxis: Intact Cognition Overall Cognitive Status: History of cognitive impairments - at baseline Arousal/Alertness: Awake/alert Orientation Level: Oriented X4 Attention: Sustained Sustained Attention: Appears intact Memory: Impaired Memory Impairment: Decreased recall of new information Awareness: Appears intact Problem Solving: Appears intact Safety/Judgment: Appears intact Sensation Sensation Light Touch: Impaired Detail Light Touch Impaired Details: Impaired RLE;Impaired LLE Proprioception: Impaired by gross assessment Additional Comments: mild dysmetria on the RLE Motor  Motor Motor: Within Functional Limits Mobility  Bed  Mobility Bed Mobility: Rolling Right;Rolling Left;Supine to Sit;Sit to Supine Rolling Right: Independent with assistive device Rolling Left: Independent with assistive device Supine to Sit: Independent with assistive device Sit to Supine: Independent with assistive device Transfers Sit to Stand: Independent with assistive device Stand to  Sit: Independent with assistive device  Trunk/Postural Assessment  Cervical Assessment Cervical Assessment: Within Functional Limits Thoracic Assessment Thoracic Assessment: Within Functional Limits Lumbar Assessment Lumbar Assessment: Within Functional Limits Postural Control Postural Control: Within Functional Limits  Balance Balance Balance Assessed: Yes Static Sitting Balance Static Sitting - Balance Support: No upper extremity supported Static Sitting - Level of Assistance: 6: Modified independent (Device/Increase time) Dynamic Sitting Balance Dynamic Sitting - Balance Support: No upper extremity supported Dynamic Sitting - Level of Assistance: 6: Modified independent (Device/Increase time) Static Standing Balance Static Standing - Level of Assistance: 5: Stand by assistance Dynamic Standing Balance Dynamic Standing - Balance Support: During functional activity Dynamic Standing - Level of Assistance: 5: Stand by assistance Extremity/Trunk Assessment RUE Assessment RUE Assessment: Within Functional Limits LUE Assessment LUE Assessment: Within Functional Limits   [name] 10/29/2019, 4:24 PM

## 2019-10-29 NOTE — Progress Notes (Signed)
Physical Therapy Session Note  Patient Details  Name: Teandra Milanowski MRN: [id number] Date of Birth: Sep 28, 1955  Today's Date: 10/29/2019 PT Individual Time: 0905-0959 PT Individual Time Calculation (min): 54 min   Short Term Goals: Week 1:  PT Short Term Goal 1 (Week 1): STG=LTG due to ELOS  Skilled Therapeutic Interventions/Progress Updates: Pt presented in room with NT performing dressing and bathing. Pt completed dressing at sink in sitting and standing with overall supervision (PTA provided total A for donning shoes for time management). Pt also performed oral hygiene at sink supervision. Pt transported to rehab gym for energy conservation. Participated in Nash assessment with an improved score of 43/56 from 33/56. Discussed continued use of RW for ambulation and energy conservation. Pt ambulated back to room at end of session with RW and supervision. Pt requesting to use bathroom, performed ambulatory transfer to toilet and performed toilet transfer with supervision and left with verbal understanding to use call light once completed.      Therapy Documentation Precautions:  Precautions Precautions: Fall Restrictions Weight Bearing Restrictions: No General:   Vital Signs:     Therapy/Group: Individual Therapy  Teshia Mahone  Anshika Pethtel, PTA 10/29/2019, 12:49 PM

## 2019-10-29 NOTE — Progress Notes (Signed)
Physical Therapy Discharge Summary  Patient Details  Name: Regina Wolfe MRN: [id number] Date of Birth: 1956/02/10  Today's Date: 10/29/2019   Patient has met 8 of 8 long term goals due to improved activity tolerance, improved balance, improved postural control, increased strength, improved attention, improved awareness and improved coordination.  Patient to discharge at an ambulatory level Supervision.   Patient's care partner is independent to provide the necessary physical and cognitive assistance at discharge.  Reasons goals not met: N/A  Recommendation:  Patient will benefit from ongoing skilled PT services in home health setting to continue to advance safe functional mobility, address ongoing impairments in balance, strength, safety, gait, AD management, and minimize fall risk.  Equipment: No equipment provided  Reasons for discharge: treatment goals met  Patient/family agrees with progress made and goals achieved: Yes  PT Discharge Precautions/Restrictions Precautions Precautions: Fall Restrictions Weight Bearing Restrictions: No Pain Pain Assessment Pain Scale: 0-10 Pain Score: 0-No pain Vision/Perception  Perception Perception: Impaired  Cognition Overall Cognitive Status: History of cognitive impairments - at baseline Arousal/Alertness: Awake/alert Orientation Level: Oriented X4 Sensation Sensation Light Touch: Impaired Detail Light Touch Impaired Details: Impaired RLE;Impaired LLE Proprioception: Impaired by gross assessment Motor  Motor Motor: Within Functional Limits  Mobility Bed Mobility Bed Mobility: Rolling Right;Rolling Left;Supine to Sit;Sit to Supine Rolling Right: Independent with assistive device Rolling Left: Independent with assistive device Supine to Sit: Independent with assistive device Sit to Supine: Independent with assistive device Transfers Transfers: Sit to Stand;Stand to Sit Sit to Stand: Independent with assistive device Stand  to Sit: Independent with assistive device Stand Pivot Transfers: Supervision/Verbal cueing Locomotion  Gait Ambulation: Yes Gait Assistance: Supervision/Verbal cueing Gait Distance (Feet): 150 Feet Assistive device: Rolling walker Gait Gait: Yes Gait Pattern: Impaired Gait Pattern: Shuffle Stairs / Additional Locomotion Stairs: Yes Stairs Assistance: Contact Guard/Touching assist Stair Management Technique: Two rails Number of Stairs: 4 Height of Stairs: 6 Wheelchair Mobility Wheelchair Mobility: No  Trunk/Postural Assessment  Cervical Assessment Cervical Assessment: Within Functional Limits Thoracic Assessment Thoracic Assessment: Within Functional Limits Lumbar Assessment Lumbar Assessment: Within Functional Limits Postural Control Postural Control: Within Functional Limits  Balance Balance Balance Assessed: Yes Standardized Balance Assessment Standardized Balance Assessment: Timed Up and Go Test Berg Balance Test Sit to Stand: Able to stand  independently using hands Standing Unsupported: Able to stand safely 2 minutes Sitting with Back Unsupported but Feet Supported on Floor or Stool: Able to sit safely and securely 2 minutes Stand to Sit: Controls descent by using hands Transfers: Able to transfer safely, definite need of hands Standing Unsupported with Eyes Closed: Able to stand 10 seconds safely Standing Ubsupported with Feet Together: Able to place feet together independently and stand for 1 minute with supervision From Standing, Reach Forward with Outstretched Arm: Can reach forward >12 cm safely (5") From Standing Position, Pick up Object from Floor: Able to pick up shoe safely and easily From Standing Position, Turn to Look Behind Over each Shoulder: Looks behind from both sides and weight shifts well Turn 360 Degrees: Able to turn 360 degrees safely but slowly Standing Unsupported, Alternately Place Feet on Step/Stool: Able to complete >2 steps/needs minimal  assist Standing Unsupported, One Foot in Front: Able to plae foot ahead of the other independently and hold 30 seconds Standing on One Leg: Able to lift leg independently and hold equal to or more than 3 seconds Total Score: 43 Timed Up and Go Test Normal TUG (seconds): 36 Static Sitting Balance Static Sitting - Balance Support:  No upper extremity supported Static Sitting - Level of Assistance: 6: Modified independent (Device/Increase time) Dynamic Sitting Balance Dynamic Sitting - Balance Support: No upper extremity supported Dynamic Sitting - Level of Assistance: 6: Modified independent (Device/Increase time) Static Standing Balance Static Standing - Balance Support: No upper extremity supported;During functional activity Static Standing - Level of Assistance: 5: Stand by assistance Dynamic Standing Balance Dynamic Standing - Balance Support: During functional activity Dynamic Standing - Level of Assistance: 5: Stand by assistance Extremity Assessment  RUE Assessment RUE Assessment: Within Functional Limits LUE Assessment LUE Assessment: Within Functional Limits RLE Assessment RLE Assessment: Within Functional Limits LLE Assessment LLE Assessment: Within Functional Limits    Regina Wolfe 10/29/2019, 1:05 PM   I attest that I was present for the following treatment session, and that the documentation accurately reflects treatment performed.   Barrie Folk PT, DPT  11:38 AM 10/30/19

## 2019-10-29 NOTE — Progress Notes (Signed)
[place] PHYSICAL MEDICINE & REHABILITATION PROGRESS NOTE  Subjective/Complaints:  Sonolent but awakens for exam  ROS: Denies CP, shortness of breath, nausea, vomiting, diarrhea.  Objective: Vital Signs: Blood pressure (!) 149/58, pulse 93, temperature 98.8 F (37.1 C), resp. rate 16, height 5' 4"  (1.626 m), weight 126.7 kg, SpO2 98 %. No results found. Recent Labs    10/27/19 0512  WBC 8.5  HGB 10.4*  HCT 32.9*  PLT 393   Recent Labs    10/27/19 0512  NA 142  K 3.5  CL 108  CO2 24  GLUCOSE 90  BUN 10  CREATININE 0.68  CALCIUM 8.6*    Physical Exam: BP (!) 149/58 (BP Location: Right Arm)   Pulse 93   Temp 98.8 F (37.1 C)   Resp 16   Ht 5' 4"  (1.626 m)   Wt 126.7 kg   SpO2 98%   BMI 47.94 kg/m  Constitutional: No distress . Vital signs reviewed.  Morbidly obese. HENT: Normocephalic.  Atraumatic. Eyes: EOMI. No discharge. Cardiovascular: No JVD. Respiratory: Normal effort.  No stridor. GI: Non-distended. Skin: Warm and dry.  Intact. Psych: Normal mood.  Normal behavior. Musc: No edema in extremities.  No tenderness in extremities. Neuro:  somnolent limited MMT in LEs laying on side UE equal grasp biceps dn triceps  Left facial weakness   Assessment/Plan: 1. Functional deficits secondary to left MCA infarcts which require 3+ hours per day of interdisciplinary therapy in a comprehensive inpatient rehab setting.  Physiatrist is providing close team supervision and 24 hour management of active medical problems listed below.  Physiatrist and rehab team continue to assess barriers to discharge/monitor patient progress toward functional and medical goals  Care Tool:  Bathing    Body parts bathed by patient: Right arm, Left arm, Chest, Abdomen, Front perineal area, Right upper leg, Left upper leg, Face   Body parts bathed by helper: Buttocks, Right lower leg, Left lower leg     Bathing assist Assist Level: Minimal Assistance - Patient > 75%      Upper Body Dressing/Undressing Upper body dressing   What is the patient wearing?: Pull over shirt    Upper body assist Assist Level: Set up assist    Lower Body Dressing/Undressing Lower body dressing      What is the patient wearing?: Pants     Lower body assist Assist for lower body dressing: Contact Guard/Touching assist     Toileting Toileting    Toileting assist Assist for toileting: Contact Guard/Touching assist     Transfers Chair/bed transfer  Transfers assist     Chair/bed transfer assist level: Supervision/Verbal cueing     Locomotion Ambulation   Ambulation assist      Assist level: Contact Guard/Touching assist Assistive device: Walker-rolling Max distance: 104f   Walk 10 feet activity   Assist     Assist level: Contact Guard/Touching assist Assistive device: Walker-rolling   Walk 50 feet activity   Assist    Assist level: Contact Guard/Touching assist Assistive device: Walker-rolling    Walk 150 feet activity   Assist Walk 150 feet activity did not occur: Safety/medical concerns         Walk 10 feet on uneven surface  activity   Assist Walk 10 feet on uneven surfaces activity did not occur: Safety/medical concerns   Assist level: Contact Guard/Touching assist Assistive device: WAeronautical engineerWill patient use wheelchair at discharge?: No Type of Wheelchair: MEducational psychologistactivity  did not occur: N/A  Wheelchair assist level: Minimal Assistance - Patient > 75% Max wheelchair distance: 50    Wheelchair 50 feet with 2 turns activity    Assist    Wheelchair 50 feet with 2 turns activity did not occur: N/A   Assist Level: Minimal Assistance - Patient > 75%   Wheelchair 150 feet activity     Assist Wheelchair 150 feet activity did not occur: N/A   Assist Level: Maximal Assistance - Patient 25 - 49%      Medical Problem List and Plan: 1.  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 Formulized individual treatment plan based on medical history, underlying problem and comorbidities.2.  Antithrombotics: -DVT/anticoagulation:  Pharmaceutical: Lovenox             -antiplatelet therapy: DAPT x3 weeks followed by Plavix alone 3. Pain Management: Gabapentin, low-dose Elavil and Cymbalta daily.  Oxycodone as needed pain  Controlled on 2/24             Monitor with increased exertion 4. Mood: LCSW to follow for evaluation and support             -antipsychotic agents: N/A 5. Neuropsych: This patient is not capable of making decisions on her own behalf. 6. Skin/Wound Care: Routine pressure relief measures.  7. Fluids/Electrolytes/Nutrition: Monitor I/O. Check lytes in am.  8.  HTN: Monitor blood pressures   Continue Coreg twice daily.   BP/heart rate labile on 2/21, monitor for trend Vitals:   10/28/19 1941 10/29/19 0447  BP: 120/70 (!) 149/58  Pulse: (!) 104 93  Resp: 18 16  Temp: 98.5 F (36.9 C) 98.8 F (37.1 C)  SpO2: 100% 98%   9.  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 Was on Metformin and glipizide prior to admission--resumed  Glipizide decreased to 2.5 twice daily on 2/20  Continue Metformin 500 twice daily  Continue to monitor blood sugars achs.    Titrate medications as indicated. CBG (last 3)  Recent Labs    10/28/19 1657 10/28/19 2106 10/29/19 0615  GLUCAP 189* 80 92  Controlled 2/24, ?1657 postprandial  10. Anemia:   Hemoglobin 10.5 on 2/20,stable at 10.4 on 2/22  Continue to monitor 11.  Hypoalbuminemia  Supplement initiated on 2/20 12.  Freq urination , dysuria, + UA, start Keflex for e coli     LOS: 5 days A FACE TO FACE EVALUATION  WAS PERFORMED  Charlett Blake 10/29/2019, 7:19 AM

## 2019-10-29 NOTE — [place] Notes (Signed)
Speech Language Pathology Discharge Summary  Patient Details  Name: Regina Wolfe MRN: [id number] Date of Birth: June 01, 1956  Today's Date: 10/29/2019 SLP Individual Time: 0730-0829 SLP Individual Time Calculation (min): 59 min   Skilled Therapeutic Interventions:  Pt was seen for skilled ST targeting communication goals. SLP facilitated session with informal conversation level tasks as well as a sentence level barrier speech task. Although Supervision A verbal cues were required for pt to verbally recall 3-4 compensatory strategies for word finding, however she implemented them Mod I throughout session. Pt left laying in bed with alarm set and needs within reach. Continue per current plan of care.      Patient has met 1 of 1 long term goals.  Patient to discharge at overall Modified Independent level.  Reasons goals not met: n/a   Clinical Impression/Discharge Summary:   Pt made excellent functional gains and met 1 out of 1 long term goals this admission. Pt presents with very mild higher level word finding deficits at the conversation level, however she is Mod I for use of compensatory strategies. Word finding deficits are no longer having a functional impact on pt's communication, and thorough pt and family education is complete, therefore no follow up ST is indicated at this time.  Care Partner:  Caregiver Able to Provide Assistance: Yes     Recommendation:  None      Equipment: none   Reasons for discharge: Discharged from hospital   Patient/Family Agrees with Progress Made and Goals Achieved: Yes    Arbutus Leas 10/29/2019, 7:09 AM

## 2019-10-29 NOTE — Patient Care Conference (Signed)
Inpatient RehabilitationTeam Conference and Plan of Care Update Date: 10/29/2019   Time: 10:00 AM    Patient Name: Regina Wolfe      Medical Record Number: [id number]  Date of Birth: [birth year] Sex: Female         Room/Bed: 4W02C/4W02C-01 Payor Info: Payor: [employer] MEDICARE / Plan: [employer] MEDICARE HMO/PPO / Product Type: *No Product type* /    Admit Date/Time:  10/24/2019  5:34 PM  Primary Diagnosis:  Acute ischemic left MCA stroke [facility])  Patient Active Problem List   Diagnosis Date Noted  . Hypoalbuminemia due to protein-calorie malnutrition ([place])   . Labile blood glucose   . Hypoglycemia   . Neuropathic pain   . Acute ischemic left MCA stroke ([place]) 10/24/2019  . History of CVA (cerebrovascular accident)   . Diabetic peripheral neuropathy ([place])   . CVA (cerebral vascular accident) ([place]) 10/19/2019  . Tachycardia 10/19/2019  . Essential hypertension 10/19/2019  . Anemia 10/19/2019  . Insulin dependent type 2 diabetes mellitus ([place]) 10/19/2019  . Peripheral neuropathy 10/19/2019  . CAD (coronary artery disease) 10/19/2019    Expected Discharge Date: Expected Discharge Date: 10/30/19  Team Members Present: Physician leading conference: Dr. Alysia Penna Nurse Present: Dorien Chihuahua, Edwina Barth, LPN Case Manager: Karene Fry, RN PT Present: Barrie Folk, PT OT Present: Darleen Crocker, OT SLP Present: Jettie Booze, CF-SLP PPS Coordinator present : Gunnar Fusi, SLP     Current Status/Progress Goal Weekly Team Focus  Bowel/Bladder   Urge incontinence of urine  Remain free of episodes of incontinence  QS/PRN assessment and assistance to bathroom   Swallow/Nutrition/ Hydration             ADL's   Min A bathing at the sink, setup UB dressing, Max A LB dressing, CGA toileting + toilet transfers using RW at ambulatory level  Supervision  ADL retraining, balance, pt/family education, d/c planning   Mobility   supervision bed mobility, CGA transfers, CGA to close S  gait with RW,  supervision overall with ambulation of household distances  endurance, balance, gait, d/c planning   Communication   Supervision A higher level word finding in conversation, increased difficulty when more fatigued  Mod I  Carryover and implementation of word finding strategies in conversation   Safety/Cognition/ Behavioral Observations            Pain   No complaints of pain at this time, pateint verbalizes understanding of importance of controlling pain  Remain free of pain  QS/PRN assessment of patient's pain   Skin   No skin breakdown, patient has ecchymosis on legs  maintain skin integrity  Assess QS/PRN for skin breakdown    Rehab Goals Patient on target to meet rehab goals: Yes *See Care Plan and progress notes for long and short-term goals.     Barriers to Discharge  Current Status/Progress Possible Resolutions Date Resolved   Nursing  Weight;Incontinence;Medical stability               PT                    OT                  SLP                SW     Plan to discharge home with sister providing assistance and other family assisting as needed          Discharge Planning/Teaching Needs:  Home  with sister who can provide assistance  TBD   Team Discussion: Monitoring BP and Bs.  RN BS 92, had BM, cont B/B, has urgency.  OT S level with walker, fam ed complete, at goal level.  PT S level overall.  SLP mod I word finding strategies.   Revisions to Treatment Plan: N/A     Medical Summary Current Status: UTI e coli S to Keflex, BPs controlled Weekly Focus/Goal: DC planning, complete tx for UTI  Barriers to Discharge: Medical stability   Possible Resolutions to Barriers: Cont rehab see above   Continued Need for Acute Rehabilitation Level of Care: The patient requires daily medical management by a physician with specialized training in physical medicine and rehabilitation for the following reasons: Direction of a multidisciplinary physical  rehabilitation program to maximize functional independence : Yes Medical management of patient stability for increased activity during participation in an intensive rehabilitation regime.: Yes Analysis of laboratory values and/or radiology reports with any subsequent need for medication adjustment and/or medical intervention. : Yes   I attest that I was present, lead the team conference, and concur with the assessment and plan of the team.   Retta Diones 10/29/2019, 1:29 PM   Team conference was held via web/ teleconference due to [place] - 19

## 2019-10-30 DIAGNOSIS — N390 Urinary tract infection, site not specified: Secondary | ICD-10-CM

## 2019-10-30 DIAGNOSIS — Z95818 Presence of other cardiac implants and grafts: Secondary | ICD-10-CM

## 2019-10-30 DIAGNOSIS — B9620 Unspecified Escherichia coli [E. coli] as the cause of diseases classified elsewhere: Secondary | ICD-10-CM

## 2019-10-30 LAB — GLUCOSE, CAPILLARY: Glucose-Capillary: 90 mg/dL (ref 70–99)

## 2019-10-30 MED ORDER — CEPHALEXIN 250 MG PO CAPS: 250.0000 mg | ORAL_CAPSULE | Freq: Three times a day (TID) | ORAL | 0 refills | Status: DC

## 2019-10-30 MED ORDER — GLYBURIDE-METFORMIN 5-500 MG PO TABS: 1.0000 | ORAL_TABLET | Freq: Two times a day (BID) | ORAL | Status: AC

## 2019-10-30 MED ORDER — SENNOSIDES-DOCUSATE SODIUM 8.6-50 MG PO TABS: 2.0000 | ORAL_TABLET | Freq: Every day | ORAL | 0 refills | Status: DC

## 2019-10-30 MED ORDER — ATORVASTATIN CALCIUM 40 MG PO TABS: 40.0000 mg | ORAL_TABLET | Freq: Every day | ORAL | 0 refills | Status: AC

## 2019-10-30 MED ORDER — CARVEDILOL 25 MG PO TABS: 25.0000 mg | ORAL_TABLET | Freq: Two times a day (BID) | ORAL | 0 refills | Status: AC

## 2019-10-30 MED ORDER — CLOPIDOGREL BISULFATE 75 MG PO TABS: 75.0000 mg | ORAL_TABLET | Freq: Every day | ORAL | 0 refills | Status: DC

## 2019-10-30 NOTE — [place] Notes (Deleted)
Team Conference Report to Patient/Family  Team Conference discussion was reviewed with the patient and caregiver, including goals, any changes in plan of care and target discharge date.  Patient and caregiver express understanding and are in agreement.  The patient has a target discharge date of 10/30/19.  Dorien Chihuahua B 10/30/2019, 8:57 AM

## 2019-10-30 NOTE — Care Management (Signed)
   The overall goal for the admission was met for:   Discharge location: Home with sister  Length of Stay: 6 days with discharge 10/30/19  Discharge activity level: Supervision overall  Home/community participation: Limited participation  Services provided included: MD, RD, PT, OT, SLP, RN, CM, Pharmacy, [place]: Medicare  Follow-up services arranged: Home Health: PT, OT with [facility] , DME:  drop arm commode and Patient/Family has no preference for HH/DME agencies  Comments (or additional information): [facility] [place], [place]  Patient/Family verbalized understanding of follow-up arrangements: Yes  Individual responsible for coordination of the follow-up plan: Westley Hummer (sister) [phone]  Confirmed correct DME delivered: Drop arm commode Margarito Liner 10/30/2019   TTB recommended however not covered by insurance. The patient was informed of need to purchase out of pocket. Given information on DME companies/places to obtain the equipment if she would like to have one. Stated an understanding of the instructions for obtaining DME not delivered to room.  Margarito Liner

## 2019-10-30 NOTE — [place] Notes (Signed)
[place] PHYSICAL MEDICINE & REHABILITATION PROGRESS NOTE  Subjective/Complaints:  Pt slept well. Leg pain better last noc   ROS: Denies CP, shortness of breath, nausea, vomiting, diarrhea.  Objective: Vital Signs: Blood pressure 120/71, pulse 95, temperature 98.5 F (36.9 C), temperature source Oral, resp. rate 18, height 5\' 4"  (1.626 m), weight 126.7 kg, SpO2 94 %. No results found. No results for input(s): WBC, HGB, HCT, PLT in the last 72 hours. No results for input(s): NA, K, CL, CO2, GLUCOSE, BUN, CREATININE, CALCIUM in the last 72 hours.  Physical Exam: BP 120/71 (BP Location: Left Arm)   Pulse 95   Temp 98.5 F (36.9 C) (Oral)   Resp 18   Ht 5\' 4"  (1.626 m)   Wt 126.7 kg   SpO2 94%   BMI 47.94 kg/m  Constitutional: No distress . Vital signs reviewed.  Morbidly obese. HENT: Normocephalic.  Atraumatic. Eyes: EOMI. No discharge. Cardiovascular: No JVD. Respiratory: Normal effort.  No stridor. GI: Non-distended. Skin: Warm and dry.  Intact. Psych: Normal mood.  Normal behavior. Musc: No edema in extremities.  No tenderness in extremities. Neuro:  somnolent limited MMT in LEs laying on side UE equal grasp biceps dn triceps  Left facial weakness   Assessment/Plan: 1. Functional deficits secondary to left MCA infarcts  Stable for D/C today F/u PCP in 3-4 weeks F/u PM&R 2 weeks See D/C summary See D/C instructions Care Tool:  Bathing    Body parts bathed by patient: Right arm, Left arm, Chest, Abdomen, Front perineal area, Right upper leg, Left upper leg, Face, Buttocks, Right lower leg, Left lower leg   Body parts bathed by helper: Buttocks, Right lower leg, Left lower leg     Bathing assist Assist Level: Supervision/Verbal cueing     Upper Body Dressing/Undressing Upper body dressing   What is the patient wearing?: Pull over shirt    Upper body assist Assist Level: Independent    Lower Body Dressing/Undressing Lower body dressing      What is  the patient wearing?: Pants     Lower body assist Assist for lower body dressing: Supervision/Verbal cueing     Toileting Toileting    Toileting assist Assist for toileting: Supervision/Verbal cueing     Transfers Chair/bed transfer  Transfers assist     Chair/bed transfer assist level: Supervision/Verbal cueing     Locomotion Ambulation   Ambulation assist      Assist level: Supervision/Verbal cueing Assistive device: Walker-rolling Max distance: 127ft   Walk 10 feet activity   Assist     Assist level: Supervision/Verbal cueing Assistive device: Walker-rolling   Walk 50 feet activity   Assist    Assist level: Supervision/Verbal cueing Assistive device: Walker-rolling    Walk 150 feet activity   Assist Walk 150 feet activity did not occur: Safety/medical concerns  Assist level: Supervision/Verbal cueing Assistive device: Walker-rolling    Walk 10 feet on uneven surface  activity   Assist Walk 10 feet on uneven surfaces activity did not occur: Safety/medical concerns   Assist level: Contact Guard/Touching assist Assistive device: Aeronautical engineer Will patient use wheelchair at discharge?: No Type of Wheelchair: Manual Wheelchair activity did not occur: N/A  Wheelchair assist level: Minimal Assistance - Patient > 75% Max wheelchair distance: 50    Wheelchair 50 feet with 2 turns activity    Assist    Wheelchair 50 feet with 2 turns activity did not occur: N/A   Assist Level:  Minimal Assistance - Patient > 75%   Wheelchair 150 feet activity     Assist Wheelchair 150 feet activity did not occur: N/A   Assist Level: Maximal Assistance - Patient 25 - 49%      Medical Problem List and Plan: 1.  Impaired mobility and ADLs secondary to multiple left frontal and temporal lobe infarcts within the left MCA and additional small acute/subacute infarcts within body of corpus callosum  Continue CIR PT,  OT .2.  Antithrombotics: -DVT/anticoagulation:  Pharmaceutical: Lovenox             -antiplatelet therapy: DAPT x3 weeks followed by Plavix alone 3. Pain Management: Gabapentin, low-dose Elavil and Cymbalta daily.  Oxycodone as needed pain  Controlled on 2/25             Monitor with increased exertion 4. Mood: LCSW to follow for evaluation and support             -antipsychotic agents: N/A 5. Neuropsych: This patient is not capable of making decisions on her own behalf. 6. Skin/Wound Care: Routine pressure relief measures.  7. Fluids/Electrolytes/Nutrition: Monitor I/O. Check lytes in am.  8.  HTN: Monitor blood pressures   Continue Coreg twice daily.  Controlled 2/25 Vitals:   10/29/19 1923 10/30/19 0526  BP: (!) 146/76 120/71  Pulse: (!) 101 95  Resp: 16 18  Temp: 98 F (36.7 C) 98.5 F (36.9 C)  SpO2: 97% 94%   9.  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 Was on Metformin and glipizide prior to admission--resumed  Glipizide decreased to 2.5 twice daily on 2/20  Continue Metformin 500 twice daily  Continue to monitor blood sugars achs.    Titrate medications as indicated. CBG (last 3)  Recent Labs    10/29/19 1655 10/29/19 2108 10/30/19 0614  GLUCAP 102* 117* 90  Controlled 2/25 10. Anemia:   Hemoglobin 10.5 on 2/20,stable at 10.4 on 2/22  Continue to monitor 11.  Hypoalbuminemia  Supplement initiated on 2/20 12.  Freq urination , dysuria, + UA, 7d Keflex for e coli     LOS: 6 days A FACE TO FACE EVALUATION WAS PERFORMED  Charlett Blake 10/30/2019, 7:16 AM

## 2019-10-30 NOTE — [place] Notes (Signed)
Team Conference Report to Patient/Family  Team Conference discussion was reviewed with the patient and caregiver, including goals, any changes in plan of care and target discharge date.  Patient and caregiver express understanding and are in agreement.  The patient has a target discharge date of 10/30/19.  Regina Wolfe B 10/30/2019, 8:59 AM

## 2019-10-30 NOTE — [place] Notes (Signed)
Patient discharged home to family, daughter present for discharge. All belongings sent with patient, no complications noted at this time.  Audie Clear, LPN

## 2019-10-30 NOTE — Discharge Instructions (Signed)
Inpatient Rehab Discharge Instructions  Regina Wolfe Discharge date and time: 10/30/19   Activities/Precautions/ Functional Status: Activity: Activity as tolerated  Diet: cardiac diet and diabetic diet Wound Care: none needed    Functional status:  ___ No restrictions     ___ Walk up steps independently _X__ 24/7 supervision/assistance   ___ Walk up steps with assistance ___ Intermittent supervision/assistance  ___ Bathe/dress independently ___ Walk with walker     ___ Bathe/dress with assistance ___ Walk Independently    ___ Shower independently ___ Walk with assistance    _X__ Shower with assistance _X__ No alcohol     ___ Return to work/school ________   Special Instructions: 1. Need to check blood sugars before meals and at bedtime--take record with you to PCP. Can check 2-3 hours post meals if you prefer also. 2. Drink plenty of fluids.  3. NOTE medication changes and do not take any medications that is not on this list.  4. Have to eat a protein snack at bedtime if blood sugar is below 150--you tend to drop in the morning.    [facility]--PT/OT  Contact information: [facility] [place], [place]   STROKE/TIA DISCHARGE INSTRUCTIONS SMOKING Cigarette smoking nearly doubles your risk of having a stroke & is the single most alterable risk factor  If you smoke or have smoked in the last 12 months, you are advised to quit smoking for your health.  Most of the excess cardiovascular risk related to smoking disappears within a year of stopping.  Ask you doctor about anti-smoking medications  [place] Quit Line: 1-800-QUIT NOW  Free Smoking Cessation Classes [phone]  CHOLESTEROL Know your levels; limit fat & cholesterol in your diet  Lipid Panel     Component Value Date/Time   CHOL 113 10/20/2019 0658   TRIG 63 10/20/2019 0658   HDL 43 10/20/2019 0658   CHOLHDL 2.6 10/20/2019 0658   VLDL 13 10/20/2019 0658   LDLCALC 57 10/20/2019 0658      Many patients benefit from treatment even if their cholesterol is at goal.  Goal: Total Cholesterol (CHOL) less than 160  Goal:  Triglycerides (TRIG) less than 150  Goal:  HDL greater than 40  Goal:  LDL (LDLCALC) less than 100   BLOOD PRESSURE American Stroke Association blood pressure target is less that 120/80 mm/Hg  Your discharge blood pressure is:  BP: 120/71  Monitor your blood pressure  Limit your salt and alcohol intake  Many individuals will require more than one medication for high blood pressure  DIABETES (A1c is a blood sugar average for last 3 months) Goal HGBA1c is under 7% (HBGA1c is blood sugar average for last 3 months)  Diabetes:     Lab Results  Component Value Date   HGBA1C 8.7 (H) 10/19/2019     Your HGBA1c can be lowered with medications, healthy diet, and exercise.  Check your blood sugar as directed by your physician  Call your physician if you experience unexplained or low blood sugars.  PHYSICAL ACTIVITY/REHABILITATION Goal is 30 minutes at least 4 days per week  Activity: Increase activity slowly, and No driving, Therapies: see above Return to work: N/A  Activity decreases your risk of heart attack and stroke and makes your heart stronger.  It helps control your weight and blood pressure; helps you relax and can improve your mood.  Participate in a regular exercise program.  Talk with your doctor about the best form of exercise for you (  dancing, walking, swimming, cycling).  DIET/WEIGHT Goal is to maintain a healthy weight  Your discharge diet is:  Diet Order            Diet heart healthy/carb modified Room service appropriate? Yes; Fluid consistency: Thin  Diet effective now             liquids Your height is:  Height: 5\' 4"  (162.6 cm) Your current weight is: Weight: 126.7 kg Your Body Mass Index (BMI) is:  BMI (Calculated): 47.92  Following the type of diet specifically designed for you will help prevent another stroke.  Your goal  weight is: 145 lbs  Your goal Body Mass Index (BMI) is 19-24.  Healthy food habits can help reduce 3 risk factors for stroke:  High cholesterol, hypertension, and excess weight.  RESOURCES Stroke/Support Group:  Call [phone]   STROKE EDUCATION PROVIDED/REVIEWED AND GIVEN TO PATIENT Stroke warning signs and symptoms How to activate emergency medical system (call 911). Medications prescribed at discharge. Need for follow-up after discharge. Personal risk factors for stroke. Pneumonia vaccine given:  Flu vaccine given:  My questions have been answered, the writing is legible, and I understand these instructions.  I will adhere to these goals & educational materials that have been provided to me after my discharge from the hospital.     My questions have been answered and I understand these instructions. I will adhere to these goals and the provided educational materials after my discharge from the hospital.  Patient/Caregiver Signature _______________________________ Date __________  Clinician Signature _______________________________________ Date __________  Please bring this form and your medication list with you to all your follow-up doctor's appointments.

## 2019-10-30 NOTE — Discharge Summary (Signed)
Physician Discharge Summary  Patient ID: Regina Wolfe MRN: [id number] DOB/AGE: Jul 27, 1956 64 y.o.  Admit date: 10/24/2019 Discharge date: 10/30/2019  Discharge Diagnoses:  Principal Problem:   Acute ischemic left MCA stroke [facility]) Active Problems:   Essential hypertension   Insulin dependent type 2 diabetes mellitus ([place])   Diabetic peripheral neuropathy (HCC)   Hypoalbuminemia due to protein-calorie malnutrition (HCC)   Neuropathic pain   Status post placement of implantable loop recorder   E. coli UTI   Discharged Condition:  Stable   Significant Diagnostic Studies: N/A   Labs:  Basic Metabolic Panel: Recent Labs  Lab 10/25/19 0633 10/27/19 0512  NA 141 142  K 3.9 3.5  CL 107 108  CO2 24 24  GLUCOSE 103* 90  BUN 6* 10  CREATININE 0.71 0.68  CALCIUM 8.3* 8.6*    CBC: Recent Labs  Lab 10/25/19 0633 10/27/19 0512  WBC 7.4 8.5  NEUTROABS 3.3  --   HGB 10.5* 10.4*  HCT 33.8* 32.9*  MCV 80.7 80.4  PLT 337 393    CBG: Recent Labs  Lab 10/29/19 0615 10/29/19 1134 10/29/19 1655 10/29/19 2108 10/30/19 0614  GLUCAP 92 254* 102* 117* 90    Brief HPI:   Regina Wolfe is a 64 y.o. 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  She was evaluated in ED in [place], was diagnosed with TIA and discharged on Plavix.  She continued to have issues with disorientation and family presented to [facility] on 10[facility]19/2019 for work-up.  MRI brain done showing multiple left frontal and temporal lobe infarcts in left MCA and additional small acute/subacute infarcts within body of corpus callosum.  Neurology felt stroke was embolic in nature and patient underwent TEE for work-up.  TEE was negative for mass thrombus or PFO therefore loop recorder placed on 02/19 by Dr. Sallyanne Kuster.  Neurology recommended DAPT x21 days followed by Plavix alone.  Therapy  evaluations revealed deficits in mobility and ADLs therefore CIR was recommended for follow-up therapy   Hospital Course: Regina Wolfe was admitted to rehab 10/24/2019 for inpatient therapies to consist of PT, ST and OT at least three hours five days a week. Past admission physiatrist, therapy team and rehab RN have worked together to provide customized collaborative inpatient rehab.  She was maintained on Plavix for secondary stroke prevention.  Follow-up CBC shows H&H and platelets to be stable.  Her blood pressures were monitored on TID basis and have been stable. Follow up check of BMET showed that lytes and renal status to be WNL.  Incision left chest wall is healing well. She was found to have  E coli UTI and started on Keflex for treatment. She is to complete on week course treatment after discharge.   Diabetes has been monitored with ac/hs CBG checks and SSI was use prn for tighter BS control.  Glyburide/Metformin was resumed at lower dose.  Her blood sugars have been variable during the stay but hypoglycemia noted in a.m.  She was advised to continue monitoring blood sugars ac/hs and to have a bedtime snack daily.  She is to follow-up with PCP for input regarding resuming insulin.  She is continent of bowel and bladder. She has made gains during rehab stay and is currently at supervision level.  She will continue to receive follow-up home health PT, and OT [facility] after discharge   Rehab course: During  patient's stay in rehab weekly team conference was held to monitor patient's progress, set goals and discuss barriers to discharge. At admission, patient required min assist with mobility and min to max assist with basic ADL tasks.  She  has had improvement in activity tolerance, balance, postural control as well as ability to compensate for deficits.  She is able to complete ADL tasks with supervision.  She is able to perform transfers at supervision to modified independent level.  She  requires supervision to ambulate 150 feet with rolling walker and verbal cues for safety.  She requires contact-guard assist to climb 4 stairs.  Family education completed with daughter regarding safety and care.   Disposition:  01-Home or Self Care  Diet: Heart healthy/Carb Modified.  Special Instructions: 1. Monitor blood sugars before meals and at bedtime.  2. No driving or strenuous activity.   Discharge Instructions    Ambulatory referral to Physical Medicine Rehab   Complete by: As directed    1-2 weeks TC appt     Allergies as of 10/30/2019      Reactions   Latex Other (See Comments)   "Hands turned black with extreme swelling"   Morphine And Related Hives      Medication List    STOP taking these medications   amitriptyline 50 MG tablet Commonly known as: ELAVIL   aspirin EC 81 MG tablet   insulin detemir 100 UNIT/ML injection Commonly known as: LEVEMIR   insulin lispro 100 UNIT/ML injection Commonly known as: HUMALOG   oxyCODONE-acetaminophen 5-325 MG tablet Commonly known as: PERCOCET/ROXICET     TAKE these medications   atorvastatin 40 MG tablet Commonly known as: LIPITOR Take 1 tablet (40 mg total) by mouth at bedtime.   carvedilol 25 MG tablet Commonly known as: COREG Take 1 tablet (25 mg total) by mouth 2 (two) times daily.   cephALEXin 250 MG capsule Commonly known as: KEFLEX Take 1 capsule (250 mg total) by mouth every 8 (eight) hours.   clopidogrel 75 MG tablet Commonly known as: PLAVIX Take 1 tablet (75 mg total) by mouth daily.   DULoxetine 60 MG capsule Commonly known as: CYMBALTA Take 60 mg by mouth daily.   gabapentin 600 MG tablet Commonly known as: NEURONTIN Take 600 mg by mouth at bedtime.   glyBURIDE-metformin 5-500 MG tablet Commonly known as: GLUCOVANCE Take 1 tablet by mouth 2 (two) times daily with a meal. What changed:   how much to take  when to take this   senna-docusate 8.6-50 MG tablet Commonly known as:  Senokot-S Take 2 tablets by mouth at bedtime.   vitamin B-12 100 MCG tablet Commonly known as: CYANOCOBALAMIN Take 100 mcg by mouth daily.   Vitamin D (Ergocalciferol) 1.25 MG (50000 UNIT) Caps capsule Commonly known as: DRISDOL Take 50,000 Units by mouth once a week.      Follow-up Information    Kirsteins, Luanna Salk, MD Follow up.   Specialty: Physical Medicine and Rehabilitation Why: Office will call you with follow up appointment Contact information: [place] [id number] [phone]        Joseph Art, MD. Call on 10/31/2019.   Specialty: Internal Medicine Why: For post hospital follow up Contact information: [place] [phone] [place] [address] [place] Follow up.   Why: Office will call you in next 2-3 days for follow up appointment Contact information: [place]  [address] [place] [phone] [place] Office Follow up on 11/04/2019.   Specialty: Cardiology Why: Appointment at 8:30 am for follow up on loop recorder Contact information: [address] [place] [phone]          Signed: Bary Leriche 10/30/2019, 10:49 PM

## 2019-10-31 ENCOUNTER — Telehealth: Payer: Self-pay | Admitting: Registered Nurse

## 2019-10-31 NOTE — Telephone Encounter (Signed)
Placed a call to Ms. Harris sister of Ms. Glacken, she asked if this provider will call her daughter Ms. Gravely. Placed a call to Ms. Gravely, unable to leave message, voicemail full.

## 2019-11-03 NOTE — Telephone Encounter (Signed)
Transitional Care call Transitional Questions answered by Daughter Regina Wolfe  Patient name: Regina Wolfe  DOB: 12/31/1955 1. Are you/is patient experiencing any problems since coming home? Regina Wolfe reports she noticed some bleeding from Regina Wolfe rectum, she believes it's coming from a hemorrhoid. She was instructed to take Regina Wolfe to ED if she notices any bleeding, she verbalizes understanding. She has a PCP appointment scheduled for 11/06/2019. a. Are there any questions regarding any aspect of care? No 2. Are there any questions regarding medications administration/dosing? No a. Are meds being taken as prescribed? Yes b. "Patient should review meds with caller to confirm" Medication List Reviewed.  3. Have there been any falls? No 4. Has Home Health been to the house and/or have they contacted you? Yes, [place].  a. If not, have you tried to contact them? NA b. Can we help you contact them? NA 5. Are bowels and bladder emptying properly? Yes a. Are there any unexpected incontinence issues? No b. If applicable, is patient following bowel/bladder programs? (                          ) 6. Any fevers, problems with breathing, unexpected pain? No 7. Are there any skin problems or new areas of breakdown? No 8. Has the patient/family member arranged specialty MD follow up (ie cardiology/neurology/renal/surgical/etc.)?  PCP HFU appointment scheduled , Regina Wolfe was instructed to call [facility] to schedule HFU appointment.  a. Can we help arrange? (                                     ) 9. Does the patient need any other services or support that we can help arrange?No 10. Are caregivers following through as expected in assisting the patient? Yes 11. Has the patient quit smoking, drinking alcohol, or using drugs as recommended? (                        )  Appointment date/time 11/04/2019  arrival time 11:00 for 11:20 appointment with Dr. Ranell Patrick. At [place]

## 2019-11-04 ENCOUNTER — Ambulatory Visit (INDEPENDENT_AMBULATORY_CARE_PROVIDER_SITE_OTHER): Payer: Medicare HMO | Admitting: *Deleted

## 2019-11-04 ENCOUNTER — Other Ambulatory Visit: Payer: Self-pay

## 2019-11-04 ENCOUNTER — Encounter: Payer: Medicare HMO | Attending: Physical Medicine and Rehabilitation | Admitting: Physical Medicine and Rehabilitation

## 2019-11-04 ENCOUNTER — Encounter: Payer: Self-pay | Admitting: Gastroenterology

## 2019-11-04 ENCOUNTER — Ambulatory Visit (HOSPITAL_COMMUNITY)
Admission: RE | Admit: 2019-11-04 | Discharge: 2019-11-04 | Disposition: A | Discharge status: Home / Self Care | Payer: Medicare HMO | Source: Ambulatory Visit | Attending: Physical Medicine and Rehabilitation | Admitting: Physical Medicine and Rehabilitation

## 2019-11-04 VITALS — BP 121/81 | HR 103 | Temp 97.7°F | Ht 65.0 in | Wt 277.4 lb

## 2019-11-04 DIAGNOSIS — K5903 Drug induced constipation: Secondary | ICD-10-CM | POA: Insufficient documentation

## 2019-11-04 DIAGNOSIS — I63512 Cerebral infarction due to unspecified occlusion or stenosis of left middle cerebral artery: Secondary | ICD-10-CM | POA: Diagnosis present

## 2019-11-04 DIAGNOSIS — I6300 Cerebral infarction due to thrombosis of unspecified precerebral artery: Secondary | ICD-10-CM

## 2019-11-04 DIAGNOSIS — M792 Neuralgia and neuritis, unspecified: Secondary | ICD-10-CM | POA: Diagnosis present

## 2019-11-04 DIAGNOSIS — G4701 Insomnia due to medical condition: Secondary | ICD-10-CM

## 2019-11-04 LAB — CUP PACEART INCLINIC DEVICE CHECK
Date Time Interrogation Session: 20210302084010
Implantable Pulse Generator Implant Date: 20210217

## 2019-11-04 MED ORDER — AMITRIPTYLINE HCL 75 MG PO TABS: 75.0000 mg | ORAL_TABLET | Freq: Every day | ORAL | 0 refills | Status: AC

## 2019-11-04 MED ORDER — DOCUSATE SODIUM 283 MG RE ENEM: 1.0000 | ENEMA | RECTAL | 0 refills | Status: DC | PRN

## 2019-11-04 NOTE — Progress Notes (Signed)
ILR wound check in clinic. Steri strips removed. Wound well healed. Home monitor transmitting nightly. R Wave 0.69mv. No episodes. Questions answered.

## 2019-11-04 NOTE — Patient Instructions (Signed)
Call the office if you have any swelling, drainage or redness at incision site. [phone]

## 2019-11-04 NOTE — Progress Notes (Signed)
Subjective:    Patient ID: Regina Wolfe, female    DOB: 1956-07-28, 64 y.o.   MRN: [id number]  HPI  Mrs. Fidel presents for transitional care follow-up after CIR admission for acute left MCA stroke.  She has been doing very well at home, receiving care from her daughter.  She has been receiving home therapy focused on strengthening and ambulation twice per week.  She has been having loose stools multiple times per day, sometimes with blood. In the hospital she had strugggled with constipation. Wearing diapers. Has hemorrhoids. Has much better control of bladder.   She has been having neuropathic pain in her feet that sometimes keeps her awake all night. She was taking Elavil 75mg  in the hospital but this was discontinued because of concerns for polypharamacy.  Otherwise taking medications as prescribed and happy with progress at home. Denies falls.   Pain Inventory Average Pain 7 Pain Right Now 7 My pain is intermittent and dull  In the last 24 hours, has pain interfered with the following? General activity 3 Relation with others 4 Enjoyment of life 0 What TIME of day is your pain at its worst? evening Sleep (in general) Fair  Pain is worse with: unsure Pain improves with: medication Relief from Meds: 5  Mobility use a walker how many minutes can you walk? 5 ability to climb steps?  yes do you drive?  no  Function disabled: date disabled 2017 I need assistance with the following:  dressing, bathing, toileting, meal prep, household duties and shopping  Neuro/Psych bladder control problems bowel control problems confusion  Prior Studies Any changes since last visit?  no x-rays CT/MRI  Physicians involved in your care Primary care . Neurologist .   Family History  Problem Relation Age of Onset  . Diabetes Mother   . Heart attack Mother   . Cancer Sister        ovarian and cervical   . Leukemia Sister    Social History   Socioeconomic History  .  Marital status: Divorced    Spouse name: Not on file  . Number of children: Not on file  . Years of education: Not on file  . Highest education level: Not on file  Occupational History  . Not on file  Tobacco Use  . Smoking status: Former Smoker    Types: Cigarettes  . Smokeless tobacco: Never Used  . Tobacco comment: smoked for 1 year when she was 17 and quit  Substance and Sexual Activity  . Alcohol use: Never  . Drug use: Never  . Sexual activity: Not Currently  Other Topics Concern  . Not on file  Social History Narrative  . Not on file   Social Determinants of Health   Financial Resource Strain:   . Difficulty of Paying Living Expenses: Not on file  Food Insecurity:   . Worried About Charity fundraiser in the Last Year: Not on file  . Ran Out of Food in the Last Year: Not on file  Transportation Needs:   . Lack of Transportation (Medical): Not on file  . Lack of Transportation (Non-Medical): Not on file  Physical Activity:   . Days of Exercise per Week: Not on file  . Minutes of Exercise per Session: Not on file  Stress:   . Feeling of Stress : Not on file  Social Connections:   . Frequency of Communication with Friends and Family: Not on file  . Frequency of Social Gatherings with Friends and  Family: Not on file  . Attends Religious Services: Not on file  . Active Member of Clubs or Organizations: Not on file  . Attends Archivist Meetings: Not on file  . Marital Status: Not on file   Past Surgical History:  Procedure Laterality Date  . ABDOMINAL HYSTERECTOMY     Total  . BUBBLE STUDY  10/22/2019   Procedure: BUBBLE STUDY;  Surgeon: Sanda Klein, MD;  Location: [place];  Service: Cardiovascular;;  . Belt  . LOOP RECORDER INSERTION N/A 10/22/2019   Procedure: LOOP RECORDER INSERTION;  Surgeon: Constance Haw, MD;  Location: [place] CV LAB;  Service: Cardiovascular;  Laterality: N/A;  . Right and Left Eye Surgery  for Detached Retinas    . TEE WITHOUT CARDIOVERSION N/A 10/22/2019   Procedure: TRANSESOPHAGEAL ECHOCARDIOGRAM (TEE);  Surgeon: Sanda Klein, MD;  Location[facility] ENDOSCOPY;  Service: Cardiovascular;  Laterality: N/A;   Past Medical History:  Diagnosis Date  . Diabetes mellitus without complication ([place])   . Stroke (HCC)    BP 121/81   Pulse (!) 103   Temp 97.7 F (36.5 C)   Ht 5\' 5"  (1.651 m)   Wt 277 lb 6.4 oz (125.8 kg)   SpO2 91%   BMI 46.16 kg/m   Opioid Risk Score:   Fall Risk Score:  `1  Depression screen PHQ 2/9  No flowsheet data found. Review of Systems  Gastrointestinal: Positive for diarrhea.  All other systems reviewed and are negative.      Objective:   Physical Exam Constitutional: No distress . Vital signs reviewed.  Morbidly obese. HENT: Normocephalic.  Atraumatic. Eyes: EOMI. No discharge. Cardiovascular: No JVD. Respiratory: Normal effort.  No stridor. GI: Non-distended. Skin: Warm and dry.  Intact. Psych: Normal mood.  Normal behavior. Musc: No edema in extremities.  No tenderness in extremities. Neuro: Alert- much improved from hospital! 5/5 strength throughout.  Left facial weakness    Assessment & Plan:  1. Impaired mobility and ADLs secondary to multiple left frontal and temporal lobe infarcts within the left MCA and additional small acute/subacute infarcts within body of corpus callosum             Continue Home PT, OT, and SLP.   2. Antithrombotics: Continue Plavix.   3. Pain Management: No longer taking Oxycodone. Continue Gabapentin, Cymbalta. Restarted Amitriptyline as was well tolerated and will help with both pain and insomnia.   4. HTN: Well controlled. Continue Coreg.  5. Bowel incontinence: Given history of constipation in hospital, she may be having hard impaction with loose stool going around it. Will obtain KUB to assess and call patient with results and to determine further plan. Will also refer to GI for loose bloody  stools and hemorrhoids as per daughter's request.  All questions answered. RTC in 1 month.   Addendum: KUB removed and shows large stool burden. Left voicemail to inform daughter and have ordered enema.

## 2019-11-11 ENCOUNTER — Ambulatory Visit: Payer: Medicare HMO | Admitting: Gastroenterology

## 2019-11-11 ENCOUNTER — Other Ambulatory Visit (INDEPENDENT_AMBULATORY_CARE_PROVIDER_SITE_OTHER): Payer: Medicare HMO

## 2019-11-11 ENCOUNTER — Other Ambulatory Visit: Payer: Self-pay

## 2019-11-11 ENCOUNTER — Encounter: Payer: Self-pay | Admitting: Gastroenterology

## 2019-11-11 VITALS — BP 118/70 | HR 108 | Temp 98.0°F | Ht 65.0 in | Wt 270.0 lb

## 2019-11-11 DIAGNOSIS — N39490 Overflow incontinence: Secondary | ICD-10-CM

## 2019-11-11 DIAGNOSIS — K625 Hemorrhage of anus and rectum: Secondary | ICD-10-CM

## 2019-11-11 DIAGNOSIS — Z7902 Long term (current) use of antithrombotics/antiplatelets: Secondary | ICD-10-CM

## 2019-11-11 LAB — CBC WITH DIFFERENTIAL/PLATELET
Basophils Absolute: 0.1 10*3/uL (ref 0.0–0.1)
Basophils Relative: 0.6 % (ref 0.0–3.0)
Eosinophils Absolute: 0.1 10*3/uL (ref 0.0–0.7)
Eosinophils Relative: 1.4 % (ref 0.0–5.0)
HCT: 35.6 % — ABNORMAL LOW (ref 36.0–46.0)
Hemoglobin: 11.5 g/dL — ABNORMAL LOW (ref 12.0–15.0)
Lymphocytes Relative: 20.5 % (ref 12.0–46.0)
Lymphs Abs: 1.9 10*3/uL (ref 0.7–4.0)
MCHC: 32.4 g/dL (ref 30.0–36.0)
MCV: 78.2 fl (ref 78.0–100.0)
Monocytes Absolute: 0.7 10*3/uL (ref 0.1–1.0)
Monocytes Relative: 7 % (ref 3.0–12.0)
Neutro Abs: 6.6 10*3/uL (ref 1.4–7.7)
Neutrophils Relative %: 70.5 % (ref 43.0–77.0)
Platelets: 464 10*3/uL — ABNORMAL HIGH (ref 150.0–400.0)
RBC: 4.55 Mil/uL (ref 3.87–5.11)
RDW: 15.6 % — ABNORMAL HIGH (ref 11.5–15.5)
WBC: 9.4 10*3/uL (ref 4.0–10.5)

## 2019-11-11 NOTE — Patient Instructions (Addendum)
If you are age 65 or older, your body mass index should be between 23-30. Your Body mass index is 44.93 kg/m. If this is out of the aforementioned range listed, please consider follow up with your Primary Care Provider.  If you are age 78 or younger, your body mass index should be between 19-25. Your Body mass index is 44.93 kg/m. If this is out of the aformentioned range listed, please consider follow up with your Primary Care Provider.   Please go to the lab in the basement of our building to have lab work done as you leave today. Hit "B" for basement when you get on the elevator.  When the doors open the lab is on your left.  We will call you with the results. Thank you.   Use 1 fleet enema.  Mix a half of a 238 gram bottle of Miralax with 32 ounces of Gatorade (room temperature).  Refrigerate.  Drink one 8 ounce glass every 15 minutes until gone. STAY CLOSE TO THE BATHROOM.   Use Miralax as directed daily to stay regular.  Thank you for entrusting me with your care and for choosing [facility] Dr. Milford Cellar

## 2019-11-11 NOTE — Progress Notes (Signed)
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  The patient is accompanied by her daughter today.  She states she has had a change in bowel habits recently.  Historically she has been on chronic narcotics for chronic pain.  More recently this has been attempted to be weaned but she takes Percocet every day.  She was admitted to the hospital for stroke last month, was given some senna for baseline constipation.  In the recent weeks she has had episodes of incontinence which is uncontrollable.  She has had some episodes of blood in her stool as well.  Mostly this is a scant amount of bleeding but she states she is had a few episodes of higher volume.  She had an x-ray of her abdomen performed on March 2 which showed a moderate to large stool burden.  She was given an enema on Saturday.  She states she did not have a bowel movement on Sunday and then Monday had one regular bowel movement.  She has been doing better since the enema.  Daughter endorses she had a positive Cologuard in 2020 which led to a colonoscopy last spring.  No records available.  She was told her prep was not too good but she had a large polyp removed.  This was done by Dr. Davina Poke in [place].  Xray 11/04/19 - moderate to large stool burden  Past Medical History:  Diagnosis Date  . Cardiac arrhythmia   . Colon polyp   . Diabetes mellitus without complication ([place])   . E-coli UTI   . Neuropathy   . Stroke ([place])   . Uterine cancer [facility])      Past Surgical History:  Procedure Laterality Date  . ABDOMINAL HYSTERECTOMY     Total  . BUBBLE STUDY  10/22/2019   Procedure: BUBBLE STUDY;  Surgeon: Sanda Klein, MD;  Location: [place];  Service: Cardiovascular;;  . [place]  . HERNIA REPAIR     abdomin with mesh  . LOOP RECORDER INSERTION N/A 10/22/2019   Procedure: LOOP  RECORDER INSERTION;  Surgeon: Constance Haw, MD;  Location: [place] [facility];  Service: Cardiovascular;  Laterality: N/A;  . Right and Left Eye Surgery for Detached Retinas    . TEE WITHOUT CARDIOVERSION N/A 10/22/2019   Procedure: TRANSESOPHAGEAL ECHOCARDIOGRAM (TEE);  Surgeon: Sanda Klein, MD;  Location: [facility] ENDOS[facility];  Service: Cardiovascular;  Laterality: N/A;   Family History  Problem Relation Age of Onset  . Diabetes Mother   . Heart attack Mother   . Ovarian cancer Mother   . Cancer Sister        different sisiters had between ovarian and uterine cancer  . Leukemia Sister   . Breast cancer Maternal Aunt   . Colon polyps Daughter   . Diabetes Daughter   . Diabetes Sister   . Heart disease Nephew    Social History   Tobacco Use  . Smoking status: Never Smoker  . Smokeless tobacco: Never Used  . Tobacco comment: smoked for 1 year when she was 17 and quit  Substance Use Topics  . Alcohol use: Never  . Drug use: Never   Current Outpatient Medications  Medication Sig Dispense Refill  . amitriptyline (ELAVIL) 75 MG tablet Take 1 tablet (75 mg total) by mouth at bedtime. 30 tablet 0  . aspirin 81 MG EC  tablet Take 1 tablet by mouth daily.    Marland Kitchen atorvastatin (LIPITOR) 40 MG tablet Take 1 tablet (40 mg total) by mouth at bedtime. 30 tablet 0  . carvedilol (COREG) 25 MG tablet Take 1 tablet (25 mg total) by mouth 2 (two) times daily. 60 tablet 0  . cephALEXin (KEFLEX) 250 MG capsule Take 1 capsule (250 mg total) by mouth every 8 (eight) hours. 15 capsule 0  . clopidogrel (PLAVIX) 75 MG tablet Take 1 tablet (75 mg total) by mouth daily. 30 tablet 0  . DULoxetine (CYMBALTA) 60 MG capsule Take 60 mg by mouth daily.    Marland Kitchen gabapentin (NEURONTIN) 600 MG tablet Take 600 mg by mouth at bedtime.    Marland Kitchen glyBURIDE-metformin (GLUCOVANCE) 5-500 MG tablet Take 1 tablet by mouth 2 (two) times daily with a meal.    . oxyCODONE-acetaminophen (PERCOCET) 10-325 MG tablet Take 1 tablet by  mouth 4 (four) times daily as needed.    . vitamin B-12 (CYANOCOBALAMIN) 100 MCG tablet Take 100 mcg by mouth daily.    . Vitamin D, Ergocalciferol, (DRISDOL) 1.25 MG (50000 UNIT) CAPS capsule Take 50,000 Units by mouth once a week.     No current facility-administered medications for this visit.   Allergies  Allergen Reactions  . Latex Other (See Comments)    "Hands turned black with extreme swelling"  . Morphine And Related Hives     Review of Systems: All systems reviewed and negative except where noted in HPI.     CBC Latest Ref Rng & Units 11/11/2019 10/27/2019 10/25/2019  WBC 4.0 - 10.5 K/uL 9.4 8.5 7.4  Hemoglobin 12.0 - 15.0 g/dL 11.5(L) 10.4(L) 10.5(L)  Hematocrit 36.0 - 46.0 % 35.6(L) 32.9(L) 33.8(L)  Platelets 150.0 - 400.0 K/uL 464.0(H) 393 337   Lab Results  Component Value Date   CREATININE 0.68 10/27/2019   BUN 10 10/27/2019   NA 142 10/27/2019   K 3.5 10/27/2019   CL 108 10/27/2019   CO2 24 10/27/2019    Lab Results  Component Value Date   ALT 27 10/25/2019   AST 34 10/25/2019   ALKPHOS 54 10/25/2019   BILITOT 0.5 10/25/2019     Physical Exam: BP 118/70 (BP Location: Left Arm, Patient Position: Sitting, Cuff Size: Normal)   Pulse (!) 108   Temp 98 F (36.7 C)   Ht 5\' 5"  (1.651 m) Comment: pt stated height-walker  Wt 270 lb (122.5 kg)   BMI 44.93 kg/m  Constitutional: Pleasant, female in no acute distress, using walker to ambulate HEENT: Normocephalic and atraumatic.  Neck supple.  Cardiovascular: Normal rate, regular rhythm.  Pulmonary/chest: Effort normal and breath sounds normal.  Abdominal: Soft, protuberant, nontender.  There are no masses palpable.  DRE - Dawson standby - no obvious fissure, some discomfort with DRE, no obvious mass lesion Extremities: no edema Neurological: Alert and oriented to person place and time. Skin: Skin is warm and dry. Psychiatric: Normal mood and affect. Behavior is normal.   ASSESSMENT AND  PLAN: 64 year old female here for reassessment the following:  Change in bowel habits / rectal bleeding / antiplatelet use - prolonged hospital admission with rehabilitation for recent stroke and now on Plavix.  She has had baseline constipation in the setting of narcotic use, worsened with hospitalization, now with episodic incontinence associate with rectal bleeding.  Recent x-ray showed moderate to large volume of stool burden now status post an enema with some improvement in recent days.  I explained to the  daughter and the patient that I suspect she probably has severe constipation with overflow incontinence, and I discussed what this is with them.  Rectal exam did not show any obvious fissure or mass. Bleeding could be hemorrhoidal in nature or perhaps stercoral ulcer in the setting of Plavix.  I do need to get the report of her recent colonoscopy to clarify how good the prep was and what was performed, will update her record once we have that.  Otherwise recommend she use another enema today and do a half a MiraLAX bowel prep to purge her bowel and reduce the stool burden hopefully this really helps reduce the overflow episodes. She should take Miralax daily once done with this for goal 1 BM daily. I will check a CBC today to make sure her Hgb is stable given the bleeding symptoms. If this regimen doesn't help, incontinence continues despite management and bleeding persists would consider endoscopic evaluation however I don't think she could hold her Plavix anytime soon given recent CVA, would be diagnostic exam only. Will await her course with the regimen and get her colonoscopy report with further recommendations.  They agreed.  I spent 45 minutes of time, including in depth chart review, independent review of results as outlined above, communicating results with the patient directly, face-to-face time with the patient, coordinating care, ordering studies and medications as appropriate, and  documenting this encounter.   Helen Cellar, MD [facility]  CC: Izora Ribas, MD

## 2019-11-12 ENCOUNTER — Encounter: Payer: Medicare HMO | Admitting: Physical Medicine and Rehabilitation

## 2019-11-28 ENCOUNTER — Other Ambulatory Visit: Payer: Self-pay | Admitting: Physical Medicine and Rehabilitation

## 2019-12-01 ENCOUNTER — Telehealth: Payer: Self-pay | Admitting: Gastroenterology

## 2019-12-01 ENCOUNTER — Encounter: Payer: Self-pay | Admitting: *Deleted

## 2019-12-01 NOTE — Telephone Encounter (Signed)
Patient's daughter reports and increase in rectal bleeding.  Patient's last OV from 3/9 Dr. Havery Moros was going to consider a colonoscopy on Plavix.  Dr. Havery Moros is not here this week.  I will have the patient see Alonza Bogus, PA tomorrow at 10;30 to assess bleeding.

## 2019-12-02 ENCOUNTER — Ambulatory Visit (INDEPENDENT_AMBULATORY_CARE_PROVIDER_SITE_OTHER): Payer: Medicare HMO | Admitting: *Deleted

## 2019-12-02 ENCOUNTER — Telehealth: Payer: Self-pay

## 2019-12-02 ENCOUNTER — Encounter: Payer: Self-pay | Admitting: Gastroenterology

## 2019-12-02 ENCOUNTER — Ambulatory Visit: Payer: Medicare HMO | Admitting: Gastroenterology

## 2019-12-02 ENCOUNTER — Ambulatory Visit (INDEPENDENT_AMBULATORY_CARE_PROVIDER_SITE_OTHER)
Admission: RE | Admit: 2019-12-02 | Discharge: 2019-12-02 | Disposition: A | Discharge status: Home / Self Care | Payer: Medicare HMO | Source: Ambulatory Visit | Attending: Gastroenterology | Admitting: Gastroenterology

## 2019-12-02 ENCOUNTER — Other Ambulatory Visit: Payer: Self-pay

## 2019-12-02 ENCOUNTER — Other Ambulatory Visit (INDEPENDENT_AMBULATORY_CARE_PROVIDER_SITE_OTHER): Payer: Medicare HMO

## 2019-12-02 VITALS — BP 104/56 | HR 64 | Temp 97.0°F | Ht 65.0 in | Wt 276.6 lb

## 2019-12-02 DIAGNOSIS — I63512 Cerebral infarction due to unspecified occlusion or stenosis of left middle cerebral artery: Secondary | ICD-10-CM

## 2019-12-02 DIAGNOSIS — K625 Hemorrhage of anus and rectum: Secondary | ICD-10-CM

## 2019-12-02 DIAGNOSIS — K5900 Constipation, unspecified: Secondary | ICD-10-CM | POA: Diagnosis not present

## 2019-12-02 LAB — CUP PACEART REMOTE DEVICE CHECK
Date Time Interrogation Session: 20210329195415
Implantable Pulse Generator Implant Date: 20210217

## 2019-12-02 LAB — CBC WITH DIFFERENTIAL/PLATELET
Basophils Absolute: 0 10*3/uL (ref 0.0–0.1)
Basophils Relative: 0.4 % (ref 0.0–3.0)
Eosinophils Absolute: 0.3 10*3/uL (ref 0.0–0.7)
Eosinophils Relative: 3.1 % (ref 0.0–5.0)
HCT: 33 % — ABNORMAL LOW (ref 36.0–46.0)
Hemoglobin: 10.3 g/dL — ABNORMAL LOW (ref 12.0–15.0)
Lymphocytes Relative: 19.9 % (ref 12.0–46.0)
Lymphs Abs: 2.2 10*3/uL (ref 0.7–4.0)
MCHC: 31.1 g/dL (ref 30.0–36.0)
MCV: 77 fl — ABNORMAL LOW (ref 78.0–100.0)
Monocytes Absolute: 0.8 10*3/uL (ref 0.1–1.0)
Monocytes Relative: 7.4 % (ref 3.0–12.0)
Neutro Abs: 7.8 10*3/uL — ABNORMAL HIGH (ref 1.4–7.7)
Neutrophils Relative %: 69.2 % (ref 43.0–77.0)
Platelets: 455 10*3/uL — ABNORMAL HIGH (ref 150.0–400.0)
RBC: 4.28 Mil/uL (ref 3.87–5.11)
RDW: 15 % (ref 11.5–15.5)
WBC: 11.2 10*3/uL — ABNORMAL HIGH (ref 4.0–10.5)

## 2019-12-02 NOTE — Telephone Encounter (Signed)
-----   Message from Loralie Champagne, PA-C sent at 12/02/2019 12:26 PM EDT ----- I saw this patient in clinic today.  After she left I was thinking that maybe we should have given her 2-day bowel prep as she had marginal to poor prep on her previous colonoscopy.  Could you please contact the patient and just have her take a couple of Dulcolax and some extra doses of MiraLAX 2 evenings prior to her colonoscopy?  Thank you much!  Jess

## 2019-12-02 NOTE — Patient Instructions (Addendum)
Your provider has requested that you have an abdominal x ray before leaving today. Please go to the basement floor to our Radiology department for the test. _______________________________________________  Your provider has requested that you go to the basement level for lab work before leaving today. Press "B" on the elevator. The lab is located at the first door on the left as you exit the elevator.  ______________________________________________ Please purchase the following medications over the counter and take as directed: Magnesium citrate--- Please use magnesium citrate x 2 this evening.  Miralax--Use 17 grams (1 capful) twice daily starting tomorrow.  ______________________________________________ Dennis Bast have been scheduled for a colonoscopy. Please follow written instructions given to you at your visit today.  Please pick up your prep supplies at the pharmacy within the next 1-3 days. If you use inhalers (even only as needed), please bring them with you on the day of your procedure. Your physician has requested that you go to www.startemmi.com and enter the access code given to you at your visit today. This web site gives a general overview about your procedure. However, you should still follow specific instructions given to you by our office regarding your preparation for the procedure.  _______________________________________________ If you are age 77 or older, your body mass index should be between 23-30. Your Body mass index is 46.03 kg/m. If this is out of the aforementioned range listed, please consider follow up with your Primary Care Provider.  If you are age 41 or younger, your body mass index should be between 19-25. Your Body mass index is 46.03 kg/m. If this is out of the aformentioned range listed, please consider follow up with your Primary Care Provider.   ________________________________________________ Due to recent changes in healthcare laws, you may see the results  of your imaging and laboratory studies on MyChart before your provider has had a chance to review them.  We understand that in some cases there may be results that are confusing or concerning to you. Not all laboratory results come back in the same time frame and the provider may be waiting for multiple results in order to interpret others.  Please give [place] 48 hours in order for your provider to thoroughly review all the results before contacting the office for clarification of your results.

## 2019-12-02 NOTE — Progress Notes (Signed)
ILR remote 

## 2019-12-02 NOTE — H&P (View-Only) (Signed)
12/02/2019 Regina Wolfe [id number] Feb 23, 1956   HISTORY OF PRESENT ILLNESS: This is a 64 year old female who is a patient of Dr. Doyne Keel.  She was just seen earlier this month on March 9 for complaints of constipation and rectal bleeding.  Colonoscopy records came in - last done 03/21/19 - Dr. Unknown Foley - 83mm transverse polyp not retrieved, no concerning pathology noted but prep was "marginal to poor". He had recommended a repeat colonoscopy in 2 years.  She continues to have rectal bleeding in varying amounts from small streaks to moderate amount in her Depends.  Not only occurring with bowel movements.  She continues to have constipation despite taking MiraLAX daily and doing the recommended MiraLAX purge.  She is on Plavix due to recent CVA in February.  Plan is to perform colonoscopy while still on her Plavix if she had continued bleeding.  Patient and family want colonoscopy.   Past Medical History:  Diagnosis Date  . Acute ischemic stroke ([place])   . Cardiac arrhythmia   . Colon polyp   . Diabetes mellitus without complication ([place])   . Diabetic peripheral neuropathy ([place])   . E-coli UTI   . Neuropathy   . Uterine cancer [facility])    Past Surgical History:  Procedure Laterality Date  . ABDOMINAL HYSTERECTOMY     Total  . BUBBLE STUDY  10/22/2019   Procedure: BUBBLE STUDY;  Surgeon: Sanda Klein, MD;  Location: [place];  Service: Cardiovascular;;  . [place]  . HERNIA REPAIR     abdomin with mesh  . LOOP RECORDER INSERTION N/A 10/22/2019   Procedure: LOOP RECORDER INSERTION;  Surgeon: Constance Haw, MD;  Location: [place] [facility];  Service: Cardiovascular;  Laterality: N/A;  . Right and Left Eye Surgery for Detached Retinas    . TEE WITHOUT CARDIOVERSION N/A 10/22/2019   Procedure: TRANSESOPHAGEAL ECHOCARDIOGRAM (TEE);  Surgeon: Sanda Klein, MD;  Location: [facility] ENDOSCOPY;  Service: Cardiovascular;  Laterality: N/A;    reports that  she has never smoked. She has never used smokeless tobacco. She reports that she does not drink alcohol or use drugs. family history includes Breast cancer in her maternal aunt; Cancer in her sister; Colon polyps in her daughter; Diabetes in her daughter, mother, and sister; Heart attack in her mother; Heart disease in her nephew; Leukemia in her sister; Ovarian cancer in her mother. Allergies  Allergen Reactions  . Latex Other (See Comments)    "Hands turned black with extreme swelling"  . Morphine And Related Hives      Outpatient Encounter Medications as of 12/02/2019  Medication Sig  . amitriptyline (ELAVIL) 75 MG tablet Take 1 tablet (75 mg total) by mouth at bedtime.  Marland Kitchen aspirin 81 MG EC tablet Take 1 tablet by mouth daily.  Marland Kitchen atorvastatin (LIPITOR) 40 MG tablet Take 1 tablet (40 mg total) by mouth at bedtime.  . carvedilol (COREG) 25 MG tablet Take 1 tablet (25 mg total) by mouth 2 (two) times daily.  . cephALEXin (KEFLEX) 250 MG capsule Take 1 capsule (250 mg total) by mouth every 8 (eight) hours.  . DULoxetine (CYMBALTA) 60 MG capsule Take 60 mg by mouth daily.  Marland Kitchen gabapentin (NEURONTIN) 600 MG tablet Take 600 mg by mouth at bedtime.  Marland Kitchen glyBURIDE-metformin (GLUCOVANCE) 5-500 MG tablet Take 1 tablet by mouth 2 (two) times daily with a meal.  . oxyCODONE-acetaminophen (PERCOCET) 10-325 MG tablet Take 1 tablet by mouth 4 (four) times daily as needed.  Marland Kitchen  vitamin B-12 (CYANOCOBALAMIN) 100 MCG tablet Take 100 mcg by mouth daily.  . Vitamin D, Ergocalciferol, (DRISDOL) 1.25 MG (50000 UNIT) CAPS capsule Take 50,000 Units by mouth once a week.  . clopidogrel (PLAVIX) 75 MG tablet TAKE 1 TABLET BY MOUTH EVERY DAY (Patient not taking: Reported on 12/02/2019)   No facility-administered encounter medications on file as of 12/02/2019.     REVIEW OF SYSTEMS  : All other systems reviewed and negative except where noted in the History of Present Illness.   PHYSICAL EXAM: BP (!) 104/56   Pulse 64    Temp (!) 97 F (36.1 C)   Ht 5\' 5"  (1.651 m)   Wt 276 lb 9.6 oz (125.5 kg)   BMI 46.03 kg/m  General: Well developed AA female in no acute distress Head: Normocephalic and atraumatic Eyes:  Sclerae anicteric, conjunctiva pink. Ears: Normal auditory acuity Lungs: Clear throughout to auscultation; no increased WOB. Heart: Regular rate and rhythm; no M/R/G. Rectal:  Recently performed by Dr. Havery Moros and will be done at the time of colonoscopy. Musculoskeletal: Symmetrical with no gross deformities  Skin: No lesions on visible extremities Extremities: No edema  Neurological: Alert oriented x 4, grossly non-focal Psychological:  Alert and cooperative. Normal mood and affect  ASSESSMENT AND PLAN: *Rectal bleeding: Not only occurring with bowel movements, now in her Depends in varying amounts between small streaks to moderate amounts of blood.  This is in the setting of Plavix.  We will plan for colonoscopy on Plavix.  ? Hemorrhoids vs stercoral ulcer vs other.  Colonoscopy needs to be done at [facility] due to patient's recent CVA within the past 3 months.  Dr. Havery Moros does not have availability so procedure was scheduled with Dr. Bryan Lemma.  The risks, benefits, and alternatives to colonoscopy were discussed with the patient and she consents to proceed.  *Constipation: We will plan to have her use 1 bottle of magnesium citrate this evening and if no bowel movement within an hour will drink a second bottle.  Once she starts having good bowel movements then tomorrow she needs to begin taking MiraLAX twice daily.  Will check CBC again today as well as abdominal x-ray at patient request. *Recent CVA in February, on Plavix *Chronic antiplatelet use on Plavix  CC:  Joseph Art, MD

## 2019-12-02 NOTE — Progress Notes (Signed)
Anesthesia Review:  PCP: Cardiologist : [place]  Chest x-ray :10/21/19-1View  EKG :10/21/19  Echo : Cardiac Cath :  Sleep Study/ CPAP : Fasting Blood Sugar :      / Checks Blood Sugar -- times a day:   Blood Thinner/ Instructions /Last Dose: ASA / Instructions/ Last Dose :  DM type 2 Plavix- Pt to remain on per daugther and special needs column , not to take am of procedure but will take in the pm of procedure  Patient denies shortness of breath, chest pain, fever, and cough at this phone interview. Stroke-2/21, ICd implant-10/22/19 Last Device Check- 12/01/19-epic  Some short term memory loss with stroke Uses walker  Daughter is poa but pt able to sign consent form  CbC done 3/30-epic

## 2019-12-02 NOTE — Progress Notes (Signed)
Agree with the assessment and plan as outlined by Alonza Bogus, PA-C.  Patient scheduled for colonoscopy with me on 12/15/2019 at [facility].  Agree with plan to resume antiplatelet therapy given recent CVA.  Elevated risks given need for ongoing Plavix, but depending on findings, can still perform limited range of therapeutic modalities.  Maximilien Hayashi, DO, [facility]

## 2019-12-02 NOTE — Telephone Encounter (Signed)
The patient has been notified of this information and all questions answered. Pt verbalized understanding of instructions

## 2019-12-02 NOTE — Telephone Encounter (Signed)
Left message on machine to call back  

## 2019-12-02 NOTE — Progress Notes (Signed)
12/02/2019 Regina Wolfe [id number] 11-May-1956   HISTORY OF PRESENT ILLNESS: This is a 64 year old female who is a patient of Dr. Doyne Keel.  She was just seen earlier this month on March 9 for complaints of constipation and rectal bleeding.  Colonoscopy records came in - last done 03/21/19 - Dr. Unknown Foley - 19mm transverse polyp not retrieved, no concerning pathology noted but prep was "marginal to poor". He had recommended a repeat colonoscopy in 2 years.  She continues to have rectal bleeding in varying amounts from small streaks to moderate amount in her Depends.  Not only occurring with bowel movements.  She continues to have constipation despite taking MiraLAX daily and doing the recommended MiraLAX purge.  She is on Plavix due to recent CVA in February.  Plan is to perform colonoscopy while still on her Plavix if she had continued bleeding.  Patient and family want colonoscopy.   Past Medical History:  Diagnosis Date  . Acute ischemic stroke ([place])   . Cardiac arrhythmia   . Colon polyp   . Diabetes mellitus without complication ([place])   . Diabetic peripheral neuropathy ([place])   . E-coli UTI   . Neuropathy   . Uterine cancer [facility])    Past Surgical History:  Procedure Laterality Date  . ABDOMINAL HYSTERECTOMY     Total  . BUBBLE STUDY  10/22/2019   Procedure: BUBBLE STUDY;  Surgeon: Sanda Klein, MD;  Location: [place];  Service: Cardiovascular;;  . [place]  . HERNIA REPAIR     abdomin with mesh  . LOOP RECORDER INSERTION N/A 10/22/2019   Procedure: LOOP RECORDER INSERTION;  Surgeon: Constance Haw, MD;  Location: [place] CV LAB;  Service: Cardiovascular;  Laterality: N/A;  . Right and Left Eye Surgery for Detached Retinas    . TEE WITHOUT CARDIOVERSION N/A 10/22/2019   Procedure: TRANSESOPHAGEAL ECHOCARDIOGRAM (TEE);  Surgeon: Sanda Klein, MD;  Location: [facility] ENDOSCOPY;  Service: Cardiovascular;  Laterality: N/A;    reports that  she has never smoked. She has never used smokeless tobacco. She reports that she does not drink alcohol or use drugs. family history includes Breast cancer in her maternal aunt; Cancer in her sister; Colon polyps in her daughter; Diabetes in her daughter, mother, and sister; Heart attack in her mother; Heart disease in her nephew; Leukemia in her sister; Ovarian cancer in her mother. Allergies  Allergen Reactions  . Latex Other (See Comments)    "Hands turned black with extreme swelling"  . Morphine And Related Hives      Outpatient Encounter Medications as of 12/02/2019  Medication Sig  . amitriptyline (ELAVIL) 75 MG tablet Take 1 tablet (75 mg total) by mouth at bedtime.  Marland Kitchen aspirin 81 MG EC tablet Take 1 tablet by mouth daily.  Marland Kitchen atorvastatin (LIPITOR) 40 MG tablet Take 1 tablet (40 mg total) by mouth at bedtime.  . carvedilol (COREG) 25 MG tablet Take 1 tablet (25 mg total) by mouth 2 (two) times daily.  . cephALEXin (KEFLEX) 250 MG capsule Take 1 capsule (250 mg total) by mouth every 8 (eight) hours.  . DULoxetine (CYMBALTA) 60 MG capsule Take 60 mg by mouth daily.  Marland Kitchen gabapentin (NEURONTIN) 600 MG tablet Take 600 mg by mouth at bedtime.  Marland Kitchen glyBURIDE-metformin (GLUCOVANCE) 5-500 MG tablet Take 1 tablet by mouth 2 (two) times daily with a meal.  . oxyCODONE-acetaminophen (PERCOCET) 10-325 MG tablet Take 1 tablet by mouth 4 (four) times daily as needed.  Marland Kitchen  vitamin B-12 (CYANOCOBALAMIN) 100 MCG tablet Take 100 mcg by mouth daily.  . Vitamin D, Ergocalciferol, (DRISDOL) 1.25 MG (50000 UNIT) CAPS capsule Take 50,000 Units by mouth once a week.  . clopidogrel (PLAVIX) 75 MG tablet TAKE 1 TABLET BY MOUTH EVERY DAY (Patient not taking: Reported on 12/02/2019)   No facility-administered encounter medications on file as of 12/02/2019.     REVIEW OF SYSTEMS  : All other systems reviewed and negative except where noted in the History of Present Illness.   PHYSICAL EXAM: BP (!) 104/56   Pulse 64    Temp (!) 97 F (36.1 C)   Ht 5\' 5"  (1.651 m)   Wt 276 lb 9.6 oz (125.5 kg)   BMI 46.03 kg/m  General: Well developed AA female in no acute distress Head: Normocephalic and atraumatic Eyes:  Sclerae anicteric, conjunctiva pink. Ears: Normal auditory acuity Lungs: Clear throughout to auscultation; no increased WOB. Heart: Regular rate and rhythm; no M/R/G. Rectal:  Recently performed by Dr. Havery Moros and will be done at the time of colonoscopy. Musculoskeletal: Symmetrical with no gross deformities  Skin: No lesions on visible extremities Extremities: No edema  Neurological: Alert oriented x 4, grossly non-focal Psychological:  Alert and cooperative. Normal mood and affect  ASSESSMENT AND PLAN: *Rectal bleeding: Not only occurring with bowel movements, now in her Depends in varying amounts between small streaks to moderate amounts of blood.  This is in the setting of Plavix.  We will plan for colonoscopy on Plavix.  ? Hemorrhoids vs stercoral ulcer vs other.  Colonoscopy needs to be done at [facility] long hospital due to patient's recent CVA within the past 3 months.  Dr. Havery Moros does not have availability so procedure was scheduled with Dr. Bryan Lemma.  The risks, benefits, and alternatives to colonoscopy were discussed with the patient and she consents to proceed.  *Constipation: We will plan to have her use 1 bottle of magnesium citrate this evening and if no bowel movement within an hour will drink a second bottle.  Once she starts having good bowel movements then tomorrow she needs to begin taking MiraLAX twice daily.  Will check CBC again today as well as abdominal x-ray at patient request. *Recent CVA in February, on Plavix *Chronic antiplatelet use on Plavix  CC:  Joseph Art, MD

## 2019-12-08 ENCOUNTER — Ambulatory Visit: Payer: Medicare HMO | Admitting: Physical Medicine and Rehabilitation

## 2019-12-09 ENCOUNTER — Encounter: Payer: Medicare HMO | Attending: Physical Medicine and Rehabilitation | Admitting: Physical Medicine and Rehabilitation

## 2019-12-09 ENCOUNTER — Ambulatory Visit (HOSPITAL_COMMUNITY)
Admission: RE | Admit: 2019-12-09 | Discharge: 2019-12-09 | Disposition: A | Discharge status: Home / Self Care | Payer: Medicare HMO | Source: Ambulatory Visit | Attending: Physical Medicine and Rehabilitation | Admitting: Physical Medicine and Rehabilitation

## 2019-12-09 ENCOUNTER — Other Ambulatory Visit: Payer: Self-pay

## 2019-12-09 ENCOUNTER — Encounter: Payer: Self-pay | Admitting: Physical Medicine and Rehabilitation

## 2019-12-09 ENCOUNTER — Other Ambulatory Visit: Payer: Self-pay | Admitting: Physical Medicine and Rehabilitation

## 2019-12-09 VITALS — BP 111/68 | HR 110 | Temp 97.7°F | Ht 65.0 in | Wt 276.0 lb

## 2019-12-09 DIAGNOSIS — M792 Neuralgia and neuritis, unspecified: Secondary | ICD-10-CM | POA: Diagnosis present

## 2019-12-09 DIAGNOSIS — M7989 Other specified soft tissue disorders: Secondary | ICD-10-CM | POA: Diagnosis present

## 2019-12-09 DIAGNOSIS — G4701 Insomnia due to medical condition: Secondary | ICD-10-CM | POA: Diagnosis present

## 2019-12-09 DIAGNOSIS — I63512 Cerebral infarction due to unspecified occlusion or stenosis of left middle cerebral artery: Secondary | ICD-10-CM | POA: Insufficient documentation

## 2019-12-09 DIAGNOSIS — K5903 Drug induced constipation: Secondary | ICD-10-CM | POA: Insufficient documentation

## 2019-12-09 DIAGNOSIS — D72829 Elevated white blood cell count, unspecified: Secondary | ICD-10-CM | POA: Insufficient documentation

## 2019-12-09 DIAGNOSIS — Z8673 Personal history of transient ischemic attack (TIA), and cerebral infarction without residual deficits: Secondary | ICD-10-CM | POA: Diagnosis not present

## 2019-12-09 DIAGNOSIS — I2510 Atherosclerotic heart disease of native coronary artery without angina pectoris: Secondary | ICD-10-CM | POA: Diagnosis not present

## 2019-12-09 MED ORDER — DOCUSATE SODIUM 100 MG PO CAPS: 100.0000 mg | ORAL_CAPSULE | Freq: Three times a day (TID) | ORAL | 0 refills | Status: DC

## 2019-12-09 MED ORDER — SENNA 8.6 MG PO TABS: 2.0000 | ORAL_TABLET | Freq: Every day | ORAL | 0 refills | Status: DC

## 2019-12-09 NOTE — Progress Notes (Signed)
Subjective:    Patient ID: Regina Wolfe, female    DOB: 08-18-1956, 63 y.o.   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  She had been doing well up until three days ago, when she developed increased swelling in her left lower extremity. This has resulted in weakness and she has had two falls at home. She was previously discharged from OT but her daughter would like for her to continue with PT given this new weakness and impaired balance. She was concerned she had a stroke and her daughter was concerned she had a clot. She did run out of Plavix for a few days before she was able to get a refill.   She is no longer having loose stools but is having small hard stools multiple times per day. In the hospital she had strugggled with constipation. Wearing diapers. Has hemorrhoids. Has much better control of bladder. Last visit I referred her to GI and obtained an XR which showed constipation. She was started on Miralax daily from her gastroenterologist and asked to take magnesium citrate twice, which she did. She recently had a repeat abdominal XR and is not yet aware of the results.   She has been having neuropathic pain in her feet that sometimes keeps her awake all night. She was taking Elavil 75mg  in the hospital but this was discontinued because of concerns for polypharamacy.  Otherwise taking medications as prescribed and happy with progress at home. Denies falls.   She does not currently have a cardiologist but had been prescribed Lasix in the past. Her daughter has given her the lasix over the weekend (20mg ) because of the increased swelling in her left leg. She is not sure if it made a difference.    Pain Inventory Average Pain 3 Pain Right Now 3 My pain is intermittent and tingling  In the last 24 hours, has pain interfered with the following? General activity 0 Relation with others 0 Enjoyment of life 6 What TIME of day is your pain at its  worst? night Sleep (in general) Fair  Pain is worse with: walking Pain improves with: medication Relief from Meds: n/a  Mobility walk with assistance use a walker how many minutes can you walk? 5 ability to climb steps?  yes do you drive?  no needs help with transfers Do you have any goals in this area?  yes  Function disabled: date disabled . I need assistance with the following:  bathing, meal prep, household duties and shopping  Neuro/Psych bladder control problems bowel control problems weakness trouble walking confusion  Prior Studies Any changes since last visit?  no  Physicians involved in your care Any changes since last visit?  no   Family History  Problem Relation Age of Onset  . Diabetes Mother   . Heart attack Mother   . Ovarian cancer Mother   . Cancer Sister        different sisiters had between ovarian and uterine cancer  . Leukemia Sister   . Breast cancer Maternal Aunt   . Colon polyps Daughter   . Diabetes Daughter   . Diabetes Sister   . Heart disease Nephew    Social History   Socioeconomic History  . Marital status: Divorced    Spouse name: Not on file  . Number of children: 1  . Years of education: Not on file  . Highest education level: Not on file  Occupational History  . Occupation:  retired Therapist, sports  Tobacco Use  . Smoking status: Never Smoker  . Smokeless tobacco: Never Used  . Tobacco comment: smoked for 1 year when she was 17 and quit  Substance and Sexual Activity  . Alcohol use: Never  . Drug use: Never  . Sexual activity: Not Currently  Other Topics Concern  . Not on file  Social History Narrative  . Not on file   Social Determinants of Health   Financial Resource Strain:   . Difficulty of Paying Living Expenses:   Food Insecurity:   . Worried About Charity fundraiser in the Last Year:   . Arboriculturist in the Last Year:   Transportation Needs:   . Film/video editor (Medical):   Marland Kitchen Lack of Transportation  (Non-Medical):   Physical Activity:   . Days of Exercise per Week:   . Minutes of Exercise per Session:   Stress:   . Feeling of Stress :   Social Connections:   . Frequency of Communication with Friends and Family:   . Frequency of Social Gatherings with Friends and Family:   . Attends Religious Services:   . Active Member of Clubs or Organizations:   . Attends Archivist Meetings:   Marland Kitchen Marital Status:    Past Surgical History:  Procedure Laterality Date  . ABDOMINAL HYSTERECTOMY     Total  . BUBBLE STUDY  10/22/2019   Procedure: BUBBLE STUDY;  Surgeon: Sanda Klein, MD;  Location: [place];  Service: Cardiovascular;;  . [place]  . HERNIA REPAIR     abdomin with mesh  . LOOP RECORDER INSERTION N/A 10/22/2019   Procedure: LOOP RECORDER INSERTION;  Surgeon: Constance Haw, MD;  Location: [place] CV LAB;  Service: Cardiovascular;  Laterality: N/A;  . Right and Left Eye Surgery for Detached Retinas    . TEE WITHOUT CARDIOVERSION N/A 10/22/2019   Procedure: TRANSESOPHAGEAL ECHOCARDIOGRAM (TEE);  Surgeon: Sanda Klein, MD;  Location[facility] ENDOSCOPY;  Service: Cardiovascular;  Laterality: N/A;   Past Medical History:  Diagnosis Date  . Acute ischemic stroke ([place])   . Cardiac arrhythmia   . Colon polyp   . Diabetes mellitus without complication ([place])   . Diabetic peripheral neuropathy ([place])   . E-coli UTI   . Neuropathy   . Uterine cancer ([place])    There were no vitals taken for this visit.  Opioid Risk Score:   Fall Risk Score:  `1  Depression screen PHQ 2/9  Depression screen PHQ 2/9 11/04/2019  Decreased Interest 1  Down, Depressed, Hopeless 0  PHQ - 2 Score 1  Altered sleeping 1  Tired, decreased energy 2  Change in appetite 0  Feeling bad or failure about yourself  0  Trouble concentrating 2  Moving slowly or fidgety/restless 2  Suicidal thoughts 0  PHQ-9 Score 8  Difficult doing work/chores Somewhat difficult    Review  of Systems  Constitutional: Negative.   HENT: Negative.   Eyes: Negative.   Respiratory: Negative.   Cardiovascular: Positive for leg swelling.  Gastrointestinal: Positive for constipation.  Musculoskeletal: Positive for gait problem.  Skin: Negative.   Neurological: Positive for weakness.  Hematological: Negative.   Psychiatric/Behavioral: Positive for confusion.  All other systems reviewed and are negative.      Objective:   Physical Exam Constitutional: No distress . Vital signs reviewed. Morbidly obese. HENT: Normocephalic. Atraumatic. Eyes: EOMI. No discharge. Cardiovascular: No JVD. Respiratory: Normal effort. No stridor. GI: Non-distended.  Skin: Increased swelling in left lower extremity.  Psych: Normal mood. Normal behavior. Musc: No edema in extremities. No tenderness in extremities. Neuro:Alert- much improved from hospital! 5/5 strength throughout.  Left facial weakness     Assessment & Plan:  1. Impaired mobility and ADLs secondary to multiple left frontal and temporal lobe infarcts within the left MCA and additional small acute/subacute infarcts within body of corpus callosum Continue Home PT. No longer requires home OT.   2. Antithrombotics: Continue Plavix.   3. Pain Management: No longer taking Oxycodone. Continue Gabapentin, Cymbalta. Restarted Amitriptyline as was well tolerated and will help with both pain and insomnia.   4. HTN: Continue Coreg.  5. Constipation: reviewed abdominal XR and discussed results with patient and her daughter. She continues to have a large stool burden. Blood in stool may be related to straining. Prescribed Senna two tablets to take at night and Colace three tablets to take during the day. Reviewed most recent CBC on 3/30 and shows drop in Hgb. Will repeat today given blood in stool.  6. Left lower extremity swelling: Referred for [place] to r/o clot given unilateral swelling and the fact that patient was off  Plavix a few days. Referred to cardiology given history of CAD and possible CHF as etiology for swelling (less likely since unilateral) as patient currently does not follow with a cardiologist. Ordered BMP to assess Creatinine since patient is taking Lasix.   All questions answered. RTC in 1 month. I will call patient today with results of [place] and lab work.

## 2019-12-10 ENCOUNTER — Telehealth: Payer: Self-pay | Admitting: Cardiology

## 2019-12-10 LAB — BASIC METABOLIC PANEL
BUN/Creatinine Ratio: 17 (ref 12–28)
BUN: 13 mg/dL (ref 8–27)
CO2: 21 mmol/L (ref 20–29)
Calcium: 9 mg/dL (ref 8.7–10.3)
Chloride: 100 mmol/L (ref 96–106)
Creatinine, Ser: 0.77 mg/dL (ref 0.57–1.00)
GFR calc Af Amer: 94 mL/min/{1.73_m2} (ref >59)
GFR calc non Af Amer: 82 mL/min/{1.73_m2} (ref >59)
Glucose: 239 mg/dL — ABNORMAL HIGH (ref 65–99)
Potassium: 4.5 mmol/L (ref 3.5–5.2)
Sodium: 137 mmol/L (ref 134–144)

## 2019-12-10 LAB — CBC WITH DIFFERENTIAL/PLATELET
Basophils Absolute: 0.1 10*3/uL (ref 0.0–0.2)
Basos: 1 %
EOS (ABSOLUTE): 0.2 10*3/uL (ref 0.0–0.4)
Eos: 2 %
Hematocrit: 32.7 % — ABNORMAL LOW (ref 34.0–46.6)
Hemoglobin: 9.9 g/dL — ABNORMAL LOW (ref 11.1–15.9)
Immature Grans (Abs): 0 10*3/uL (ref 0.0–0.1)
Immature Granulocytes: 0 %
Lymphocytes Absolute: 1.8 10*3/uL (ref 0.7–3.1)
Lymphs: 22 %
MCH: 24.1 pg — ABNORMAL LOW (ref 26.6–33.0)
MCHC: 30.3 g/dL — ABNORMAL LOW (ref 31.5–35.7)
MCV: 80 fL (ref 79–97)
Monocytes Absolute: 0.7 10*3/uL (ref 0.1–0.9)
Monocytes: 9 %
Neutrophils Absolute: 5.5 10*3/uL (ref 1.4–7.0)
Neutrophils: 66 %
Platelets: 446 10*3/uL (ref 150–450)
RBC: 4.1 x10E6/uL (ref 3.77–5.28)
RDW: 14 % (ref 11.7–15.4)
WBC: 8.3 10*3/uL (ref 3.4–10.8)

## 2019-12-10 NOTE — Telephone Encounter (Signed)
    Went in to pt's chart to see who pt had talked to about an appoitment

## 2019-12-11 ENCOUNTER — Other Ambulatory Visit (HOSPITAL_COMMUNITY)
Admission: RE | Admit: 2019-12-11 | Discharge: 2019-12-11 | Disposition: A | Discharge status: Home / Self Care | Payer: Medicare HMO | Source: Ambulatory Visit | Attending: Gastroenterology | Admitting: Gastroenterology

## 2019-12-11 DIAGNOSIS — Z01812 Encounter for preprocedural laboratory examination: Secondary | ICD-10-CM | POA: Diagnosis present

## 2019-12-11 DIAGNOSIS — Z20822 Contact with and (suspected) exposure to covid-19: Secondary | ICD-10-CM | POA: Diagnosis not present

## 2019-12-11 LAB — SARS CORONAVIRUS 2 (TAT 6-24 HRS): SARS Coronavirus 2: NEGATIVE

## 2019-12-13 ENCOUNTER — Other Ambulatory Visit: Payer: Self-pay | Admitting: Gastroenterology

## 2019-12-13 MED ORDER — NA SULFATE-K SULFATE-MG SULF 17.5-3.13-1.6 GM/177ML PO SOLN: ORAL | 0 refills | Status: DC

## 2019-12-15 ENCOUNTER — Ambulatory Visit (HOSPITAL_COMMUNITY)
Admission: RE | Admit: 2019-12-15 | Discharge: 2019-12-15 | Disposition: A | Discharge status: Home / Self Care | Payer: Medicare HMO | Attending: Gastroenterology | Admitting: Gastroenterology

## 2019-12-15 ENCOUNTER — Other Ambulatory Visit: Payer: Self-pay

## 2019-12-15 ENCOUNTER — Telehealth: Payer: Self-pay | Admitting: Gastroenterology

## 2019-12-15 ENCOUNTER — Ambulatory Visit (HOSPITAL_COMMUNITY): Payer: Medicare HMO | Admitting: Physician Assistant

## 2019-12-15 ENCOUNTER — Encounter (HOSPITAL_COMMUNITY)
Admission: RE | Disposition: A | Discharge status: Home / Self Care | Payer: Self-pay | Source: Home / Self Care | Attending: Gastroenterology

## 2019-12-15 ENCOUNTER — Encounter (HOSPITAL_COMMUNITY): Payer: Self-pay | Admitting: Gastroenterology

## 2019-12-15 DIAGNOSIS — Z8249 Family history of ischemic heart disease and other diseases of the circulatory system: Secondary | ICD-10-CM | POA: Insufficient documentation

## 2019-12-15 DIAGNOSIS — K626 Ulcer of anus and rectum: Secondary | ICD-10-CM

## 2019-12-15 DIAGNOSIS — Z86010 Personal history of colonic polyps: Secondary | ICD-10-CM | POA: Diagnosis not present

## 2019-12-15 DIAGNOSIS — K5730 Diverticulosis of large intestine without perforation or abscess without bleeding: Secondary | ICD-10-CM | POA: Insufficient documentation

## 2019-12-15 DIAGNOSIS — Z8542 Personal history of malignant neoplasm of other parts of uterus: Secondary | ICD-10-CM | POA: Diagnosis not present

## 2019-12-15 DIAGNOSIS — D649 Anemia, unspecified: Secondary | ICD-10-CM | POA: Insufficient documentation

## 2019-12-15 DIAGNOSIS — Z885 Allergy status to narcotic agent status: Secondary | ICD-10-CM | POA: Insufficient documentation

## 2019-12-15 DIAGNOSIS — I6930 Unspecified sequelae of cerebral infarction: Secondary | ICD-10-CM | POA: Insufficient documentation

## 2019-12-15 DIAGNOSIS — K635 Polyp of colon: Secondary | ICD-10-CM

## 2019-12-15 DIAGNOSIS — I2510 Atherosclerotic heart disease of native coronary artery without angina pectoris: Secondary | ICD-10-CM | POA: Insufficient documentation

## 2019-12-15 DIAGNOSIS — Z91040 Latex allergy status: Secondary | ICD-10-CM | POA: Insufficient documentation

## 2019-12-15 DIAGNOSIS — K5900 Constipation, unspecified: Secondary | ICD-10-CM

## 2019-12-15 DIAGNOSIS — Z833 Family history of diabetes mellitus: Secondary | ICD-10-CM | POA: Insufficient documentation

## 2019-12-15 DIAGNOSIS — D125 Benign neoplasm of sigmoid colon: Secondary | ICD-10-CM | POA: Diagnosis not present

## 2019-12-15 DIAGNOSIS — Z87891 Personal history of nicotine dependence: Secondary | ICD-10-CM | POA: Diagnosis not present

## 2019-12-15 DIAGNOSIS — I499 Cardiac arrhythmia, unspecified: Secondary | ICD-10-CM | POA: Insufficient documentation

## 2019-12-15 DIAGNOSIS — Z79899 Other long term (current) drug therapy: Secondary | ICD-10-CM | POA: Insufficient documentation

## 2019-12-15 DIAGNOSIS — Z90710 Acquired absence of both cervix and uterus: Secondary | ICD-10-CM | POA: Insufficient documentation

## 2019-12-15 DIAGNOSIS — Z7982 Long term (current) use of aspirin: Secondary | ICD-10-CM | POA: Insufficient documentation

## 2019-12-15 DIAGNOSIS — Z7902 Long term (current) use of antithrombotics/antiplatelets: Secondary | ICD-10-CM | POA: Diagnosis not present

## 2019-12-15 DIAGNOSIS — Z6841 Body Mass Index (BMI) 40.0 and over, adult: Secondary | ICD-10-CM | POA: Insufficient documentation

## 2019-12-15 DIAGNOSIS — K921 Melena: Secondary | ICD-10-CM | POA: Diagnosis present

## 2019-12-15 DIAGNOSIS — E1142 Type 2 diabetes mellitus with diabetic polyneuropathy: Secondary | ICD-10-CM | POA: Diagnosis not present

## 2019-12-15 DIAGNOSIS — K6389 Other specified diseases of intestine: Secondary | ICD-10-CM

## 2019-12-15 DIAGNOSIS — K625 Hemorrhage of anus and rectum: Secondary | ICD-10-CM

## 2019-12-15 DIAGNOSIS — I10 Essential (primary) hypertension: Secondary | ICD-10-CM | POA: Diagnosis not present

## 2019-12-15 DIAGNOSIS — Z803 Family history of malignant neoplasm of breast: Secondary | ICD-10-CM | POA: Insufficient documentation

## 2019-12-15 DIAGNOSIS — Z806 Family history of leukemia: Secondary | ICD-10-CM | POA: Insufficient documentation

## 2019-12-15 DIAGNOSIS — D120 Benign neoplasm of cecum: Secondary | ICD-10-CM | POA: Diagnosis not present

## 2019-12-15 DIAGNOSIS — Z8041 Family history of malignant neoplasm of ovary: Secondary | ICD-10-CM | POA: Insufficient documentation

## 2019-12-15 DIAGNOSIS — C187 Malignant neoplasm of sigmoid colon: Secondary | ICD-10-CM | POA: Diagnosis not present

## 2019-12-15 HISTORY — PX: HEMOSTASIS CLIP PLACEMENT: SHX6857

## 2019-12-15 HISTORY — PX: BIOPSY: SHX5522

## 2019-12-15 HISTORY — PX: SCLEROTHERAPY: SHX6841

## 2019-12-15 HISTORY — PX: COLONOSCOPY WITH PROPOFOL: SHX5780

## 2019-12-15 LAB — GLUCOSE, CAPILLARY: Glucose-Capillary: 235 mg/dL — ABNORMAL HIGH (ref 70–99)

## 2019-12-15 SURGERY — COLONOSCOPY WITH PROPOFOL
Anesthesia: Monitor Anesthesia Care

## 2019-12-15 MED ORDER — SPOT INK MARKER SYRINGE KIT
PACK | SUBMUCOSAL | Status: DC | PRN
  Administered 2019-12-15: 3 mL via SUBMUCOSAL

## 2019-12-15 MED ORDER — LIDOCAINE HCL (CARDIAC) PF 100 MG/5ML IV SOSY
PREFILLED_SYRINGE | INTRAVENOUS | Status: DC | PRN
  Administered 2019-12-15: 100 mg via INTRATRACHEAL

## 2019-12-15 MED ORDER — PROPOFOL 500 MG/50ML IV EMUL
INTRAVENOUS | Status: DC | PRN
  Administered 2019-12-15: 100 ug/kg/min via INTRAVENOUS

## 2019-12-15 MED ORDER — LACTATED RINGERS IV SOLN: INTRAVENOUS | Status: DC

## 2019-12-15 MED ORDER — SPOT INK MARKER SYRINGE KIT
PACK | SUBMUCOSAL | Status: AC
  Filled 2019-12-15: qty 5

## 2019-12-15 MED ORDER — PROPOFOL 500 MG/50ML IV EMUL
INTRAVENOUS | Status: AC
  Filled 2019-12-15: qty 50

## 2019-12-15 MED ORDER — SODIUM CHLORIDE 0.9 % IV SOLN: INTRAVENOUS | Status: DC

## 2019-12-15 MED ORDER — ONDANSETRON HCL 4 MG/2ML IJ SOLN
INTRAMUSCULAR | Status: DC | PRN
  Administered 2019-12-15: 4 mg via INTRAVENOUS

## 2019-12-15 MED ORDER — PROPOFOL 500 MG/50ML IV EMUL
INTRAVENOUS | Status: DC | PRN
  Administered 2019-12-15: 20 mg via INTRAVENOUS

## 2019-12-15 SURGICAL SUPPLY — 22 items

## 2019-12-15 NOTE — Addendum Note (Signed)
Addendum  created 12/15/19 1123 by Nolon Nations, MD   Clinical Note Signed

## 2019-12-15 NOTE — Anesthesia Postprocedure Evaluation (Addendum)
Anesthesia Post Note  Patient: Regina Wolfe  Procedure(s) Performed: COLONOSCOPY WITH PROPOFOL (N/A ) SCLEROTHERAPY BIOPSY HEMOSTASIS CLIP PLACEMENT     Patient location during evaluation: PACU Anesthesia Type: MAC Level of consciousness: awake and alert Pain management: pain level controlled Vital Signs Assessment: post-procedure vital signs reviewed and stable Respiratory status: spontaneous breathing Cardiovascular status: stable Anesthetic complications: no Comments: Some mild emesis during procedure. No indication of aspiration. Lungs clear in PACU. No cough, SpO2 100% on RA.    Last Vitals:  Vitals:   12/15/19 1100 12/15/19 1110  BP: (!) 140/51 (!) 181/95  Pulse: 94 92  Resp: 16 16  Temp: (!) 36.1 C   SpO2: 97% 94%    Last Pain:  Vitals:   12/15/19 1100  TempSrc: Temporal  PainSc:                  Nolon Nations

## 2019-12-15 NOTE — Interval H&P Note (Signed)
History and Physical Interval Note:  12/15/2019 8:58 AM  Regina Wolfe  has presented today for surgery, with the diagnosis of rectal bleeding, constipation.  The various methods of treatment have been discussed with the patient and family. After consideration of risks, benefits and other options for treatment, the patient has consented to  Procedure(s): COLONOSCOPY WITH PROPOFOL (N/A) as a surgical intervention.  The patient's history has been reviewed, patient examined, no change in status, stable for surgery.  I have reviewed the patient's chart and labs.  Questions were answered to the patient's satisfaction.     Dominic Pea Regina Wolfe

## 2019-12-15 NOTE — Telephone Encounter (Signed)
-----   Message from Roetta Sessions, [place] sent at 12/15/2019 11:38 AM EDT ----- Havery Moros doesn't have any openings that soon; I scheduled her for an appt with Janett Billow in 3 weeks on Friday, 01-09-20 at 1:30pm.  Would you let her know when you speak with her regarding CT and referrals?  Thank you!  Jan   ----- Message ----- From: Lavena Bullion, DO Sent: 12/15/2019  11:17 AM EDT To: Loralie Champagne, PA-C, Yetta Flock, MD, #  Colonoscopy completed and unfortunately with a distal sigmoid CA. 2 other polyps resected and clipped (due to Plavix) and a stercoral ulcer (clipped), but the CA certainly the issue to tackle now. Her bleeding could be from either the stercoral ulcer and/or the mass (which was actively oozing).   Bri- Please set up for CT C/A/P for staging and CEA along with referral to Colorectal Surgery and Oncology.   Lonia Blood- I will call her with the path results, but I also recommended f/u appt with either of you in 2-3 weeks to makes ure all is coordinated with her newly diagnosed CA along with ongoing management of constipation with overflow diarrhea.   [employer]

## 2019-12-15 NOTE — Telephone Encounter (Signed)
Called and spoke Regina Wolfe-patient's daughter-verified DPR-Regina Wolfe informed of MD recommendations and instructions and is agreeable to plan of care;  Regina Wolfe has requested the ROV be rescheduled to 01/07/2020 at 9:00 am with Regina Wolfe, [place]; Regina Wolfe advised to call back to the office at [phone] should questions/concerns arise;  Regina Wolfe verbalized understanding of information/instructions; CT chest/abd/pel has been ordered, lab work has been ordered in [employer]; Regina Wolfe is aware of need for CT scan and lab work to be completed at earliest convenience;

## 2019-12-15 NOTE — Anesthesia Preprocedure Evaluation (Signed)
Anesthesia Evaluation  Patient identified by MRN, date of birth, ID band Patient awake    Reviewed: Allergy & Precautions, H&P , NPO status , Patient's Chart, lab work & pertinent test results, reviewed documented beta blocker date and time   Airway Mallampati: III  TM Distance: >3 FB Neck ROM: Full    Dental no notable dental hx. (+) Teeth Intact, Dental Advisory Given   Pulmonary neg pulmonary ROS, former smoker,    Pulmonary exam normal breath sounds clear to auscultation       Cardiovascular hypertension, Pt. on medications and Pt. on home beta blockers + CAD   Rhythm:Regular Rate:Normal  Echo 10/2019 1. Left ventricular ejection fraction, by estimation, is 50 to 55%. The left ventricle has low normal function. The left ventricle has no regional wall motion abnormalities. Left ventricular diastolic function could not be evaluated.  2. Right ventricular systolic function is normal. The right ventricular size is normal.  3. No left atrial/left atrial appendage thrombus was detected.  4. The mitral valve is normal in structure and function. No evidence of mitral valve regurgitation. No evidence of mitral stenosis.  5. The aortic valve is normal in structure and function. Aortic valve regurgitation is not visualized. No aortic stenosis is present.  6. The inferior vena cava is normal in size with greater than 50% respiratory variability, suggesting right atrial pressure of 3 mmHg.  7. Agitated saline contrast bubble study was negative, with no evidence of any interatrial shunt.    Neuro/Psych  Neuromuscular disease CVA, Residual Symptoms negative psych ROS   GI/Hepatic negative GI ROS, Neg liver ROS,   Endo/Other  diabetes, Insulin Dependent, Oral Hypoglycemic AgentsMorbid obesity  Renal/GU negative Renal ROS     Musculoskeletal   Abdominal   Peds  Hematology  (+) Blood dyscrasia, anemia ,   Anesthesia Other  Findings   Reproductive/Obstetrics negative OB ROS                             Anesthesia Physical  Anesthesia Plan  ASA: III  Anesthesia Plan: MAC   Post-op Pain Management:    Induction: Intravenous  PONV Risk Score and Plan: 2 and Propofol infusion and Treatment may vary due to age or medical condition  Airway Management Planned: Nasal Cannula and Natural Airway  Additional Equipment: None  Intra-op Plan:   Post-operative Plan:   Informed Consent: I have reviewed the patients History and Physical, chart, labs and discussed the procedure including the risks, benefits and alternatives for the proposed anesthesia with the patient or authorized representative who has indicated his/her understanding and acceptance.     Dental advisory given  Plan Discussed with: CRNA  Anesthesia Plan Comments:         Anesthesia Quick Evaluation

## 2019-12-15 NOTE — Op Note (Signed)
[facility] Patient Name: Regina Wolfe Procedure Date: 12/15/2019 MRN: [id number] Attending MD: Gerrit Heck , MD Date of Birth: 05-21-1956 CSN: [id number] Age: 64 Admit Type: Outpatient Procedure:                Colonoscopy Indications:              Hematochezia, Change in bowel habits, Constipation Providers:                Gerrit Heck, MD, Carmie End, RN, Doristine Johns RN,Guillaume Leighton Roach, Technician Referring MD:              Medicines:                Monitored Anesthesia Care Complications:            No immediate complications. Estimated Blood Loss:     Estimated blood loss was minimal. Procedure:                Pre-Anesthesia Assessment:                           - Prior to the procedure, a History and Physical                            was performed, and patient medications and                            allergies were reviewed. The patient's tolerance of                            previous anesthesia was also reviewed. The risks                            and benefits of the procedure and the sedation                            options and risks were discussed with the patient.                            All questions were answered, and informed consent                            was obtained. Prior Anticoagulants: The patient has                            taken Plavix (clopidogrel), last dose was 1 day                            prior to procedure. ASA Grade Assessment: III - A                            patient with severe systemic disease. After  reviewing the risks and benefits, the patient was                            deemed in satisfactory condition to undergo the                            procedure.                           After obtaining informed consent, the colonoscope                            was passed under direct vision. Throughout the   procedure, the patient's blood pressure, pulse, and                            oxygen saturations were monitored continuously. The                            [id number] Olympus colonoscope was                            introduced through the anus and advanced to the the                            cecum, identified by appendiceal orifice and                            ileocecal valve. The colonoscopy was technically                            difficult and complex due to significant looping                            and a tortuous colon. Successful completion of the                            procedure was aided by applying abdominal pressure.                            The patient tolerated the procedure well. The                            quality of the bowel preparation was fair. The                            ileocecal valve, appendiceal orifice, and rectum                            were photographed. Scope In: 9:51:32 AM Scope Out: 10:44:35 AM Scope Withdrawal Time: 0 hours 39 minutes 20 seconds  Total Procedure Duration: 0 hours 53 minutes 3 seconds  Findings:      The perianal and digital rectal examinations were normal.      A 5 mm polyp was found in  the cecum. The polyp was sessile. The polyp       was removed with a cold snare. Resection and retrieval were complete. To       prevent bleeding after the polypectomy, one hemostatic clip was       successfully placed. There was no bleeding at the end of the procedure.      A 5 mm polyp was found in the sigmoid colon. The polyp was sessile. The       polyp was removed with a cold snare. Resection and retrieval were       complete. To prevent bleeding after the polypectomy, one hemostatic clip       was successfully placed. There was no bleeding at the end of the       procedure.      A frond-like/villous, fungating and ulcerated partially obstructing mass       was found in the distal sigmoid colon, located 17-22 cm from  the anal       verge. The mass was partially circumferential (involving two-thirds of       the lumen circumference). The mass measured five cm in length. Oozing       was present. This was biopsied with a cold forceps for histology. Area       2-3 cm proximal and 2 cm distal to the mass was tattooed with an       injection of 3 mL of Spot (carbon black). There was one small (2-3 mm)       polyp located within the tattoo field that was not resected. Estimated       blood loss was minimal.      A single (solitary) four mm ulcer was found in the proximal rectum. No       bleeding was present. For hemostasis, one hemostatic clip was       successfully placed. There was no bleeding at the end of the procedure.      The sigmoid colon and ascending colon revealed significantly excessive       looping. Advancing the scope required applying abdominal pressure.      Multiple small-mouthed diverticula were found in the sigmoid colon.      Retroflexion in the rectum was not performed due to anatomy.      A moderate amount of semi-solid stool was found in the entire colon,       interfering with visualization. Lavage of the area was performed using       copious amounts of sterile water, resulting in clearance with adequate       visualization. Impression:               - Preparation of the colon was fair.                           - One 5 mm polyp in the cecum, removed with a cold                            snare. Resected and retrieved. Clip was placed.                           - One 5 mm polyp in the sigmoid colon, removed with  a cold snare. Resected and retrieved. Clip was                            placed.                           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 Biopsied. Tattooed.                           - A single (solitary) ulcer in the proximal rectum.                            Location and  appearance seems most consistent with                            stercoral ulcer. Clip was placed.                           - There was significant looping of the colon.                           - Diverticulosis in the sigmoid colon.                           - Of note, the tissue was somewhat friable,                            including some friability with placement of each of                            the clips. Moderate Sedation:      Not Applicable - Patient had care per Anesthesia. Recommendation:           - Patient has a contact number available for                            emergencies. The signs and symptoms of potential                            delayed complications were discussed with the                            patient. Return to normal activities tomorrow.                            Written discharge instructions were provided to the                            patient.                           - Resume previous diet.                           -  Continue present medications.                           - Await pathology results.                           - Return to GI clinic in 2-3 weeks.                           - Perform a CT scan (computed tomography) of chest                            with contrast, abdomen with contrast and pelvis                            with contrast at the next available appointment.                           - Check CEA at the next available appointment.                           - Refer to an oncologist at appointment to be                            scheduled.                           - Refer to a colo-rectal surgeon at appointment to                            be scheduled. Procedure Code(s):        --- Professional ---                           [phone], 59, Colonoscopy, flexible; with control of                            bleeding, any method                           45385, Colonoscopy, flexible; with removal of                             tumor(s), polyp(s), or other lesion(s) by snare                            technique                           45381, 59, Colonoscopy, flexible; with directed                            submucosal injection(s), any substance                           [id number], 59, Colonoscopy, flexible; with biopsy,  single or multiple Diagnosis Code(s):        --- Professional ---                           K63.5, Polyp of colon                           C18.7, Malignant neoplasm of sigmoid colon                           K56.690, Other partial intestinal obstruction                           K62.6, Ulcer of anus and rectum                           K92.1, Melena (includes Hematochezia)                           R19.4, Change in bowel habit                           K59.00, Constipation, unspecified                           K57.30, Diverticulosis of large intestine without                            perforation or abscess without bleeding CPT copyright 2019 American [employer] Association. All rights reserved. The codes documented in this report are preliminary and upon coder review may  be revised to meet current compliance requirements. Gerrit Heck, MD 12/15/2019 11:00:54 AM Number of Addenda: 0

## 2019-12-15 NOTE — Discharge Instructions (Signed)
YOU HAD AN ENDOSCOPIC PROCEDURE TODAY: Refer to the procedure report and other information in the discharge instructions given to you for any specific questions about what was found during the examination. If this information does not answer your questions, please call [place] office at [phone] to clarify.  ° °YOU SHOULD EXPECT: Some feelings of bloating in the abdomen. Passage of more gas than usual. Walking can help get rid of the air that was put into your GI tract during the procedure and reduce the bloating. If you had a lower endoscopy (such as a colonoscopy or flexible sigmoidoscopy) you may notice spotting of blood in your stool or on the toilet paper. Some abdominal soreness may be present for a day or two, also. ° °DIET: Your first meal following the procedure should be a light meal and then it is ok to progress to your normal diet. A half-sandwich or bowl of soup is an example of a good first meal. Heavy or fried foods are harder to digest and may make you feel nauseous or bloated. Drink plenty of fluids but you should avoid alcoholic beverages for 24 hours. If you had a esophageal dilation, please see attached instructions for diet.   ° °ACTIVITY: Your care partner should take you home directly after the procedure. You should plan to take it easy, moving slowly for the rest of the day. You can resume normal activity the day after the procedure however YOU SHOULD NOT DRIVE, use power tools, machinery or perform tasks that involve climbing or major physical exertion for 24 hours (because of the sedation medicines used during the test).  ° °SYMPTOMS TO REPORT IMMEDIATELY: °A gastroenterologist can be reached at any hour. Please call [phone]  for any of the following symptoms:  °Following lower endoscopy (colonoscopy, flexible sigmoidoscopy) °Excessive amounts of blood in the stool  °Significant tenderness, worsening of abdominal pains  °Swelling of the abdomen that is new, acute  °Fever of 100° or  higher  °Following upper endoscopy (EGD, EUS, ERCP, esophageal dilation) °Vomiting of blood or coffee ground material  °New, significant abdominal pain  °New, significant chest pain or pain under the shoulder blades  °Painful or persistently difficult swallowing  °New shortness of breath  °Black, tarry-looking or red, bloody stools ° °FOLLOW UP:  °If any biopsies were taken you will be contacted by phone or by letter within the next 1-3 weeks. Call [phone]  if you have not heard about the biopsies in 3 weeks.  °Please also call with any specific questions about appointments or follow up tests. ° °

## 2019-12-15 NOTE — Transfer of Care (Signed)
Immediate Anesthesia Transfer of Care Note  Patient: Regina Wolfe  Procedure(s) Performed: COLONOSCOPY WITH PROPOFOL (N/A )  Patient Location: PACU  Anesthesia Type:MAC  Level of Consciousness: awake, alert  and oriented  Airway & Oxygen Therapy: Patient Spontanous Breathing and Patient connected to face mask oxygen  Post-op Assessment: Report given to RN  Post vital signs: Reviewed and stable  Last Vitals:  Vitals Value Taken Time  BP    Temp    Pulse    Resp    SpO2      Last Pain:  Vitals:   12/15/19 0913  TempSrc: Oral  PainSc: 0-No pain         Complications: No apparent anesthesia complications

## 2019-12-16 ENCOUNTER — Encounter: Payer: Self-pay | Admitting: *Deleted

## 2019-12-16 LAB — SURGICAL PATHOLOGY

## 2019-12-17 ENCOUNTER — Other Ambulatory Visit: Payer: Self-pay

## 2019-12-17 DIAGNOSIS — K625 Hemorrhage of anus and rectum: Secondary | ICD-10-CM

## 2019-12-17 DIAGNOSIS — K635 Polyp of colon: Secondary | ICD-10-CM

## 2019-12-17 DIAGNOSIS — K626 Ulcer of anus and rectum: Secondary | ICD-10-CM

## 2019-12-17 NOTE — Progress Notes (Signed)
Oncology referral placed per procedure report recommendations

## 2019-12-18 ENCOUNTER — Other Ambulatory Visit: Payer: Medicare HMO

## 2019-12-18 ENCOUNTER — Encounter: Payer: Self-pay | Admitting: *Deleted

## 2019-12-18 DIAGNOSIS — K626 Ulcer of anus and rectum: Secondary | ICD-10-CM

## 2019-12-18 DIAGNOSIS — K635 Polyp of colon: Secondary | ICD-10-CM

## 2019-12-18 DIAGNOSIS — K5900 Constipation, unspecified: Secondary | ICD-10-CM

## 2019-12-18 DIAGNOSIS — K6389 Other specified diseases of intestine: Secondary | ICD-10-CM

## 2019-12-18 DIAGNOSIS — K625 Hemorrhage of anus and rectum: Secondary | ICD-10-CM

## 2019-12-18 LAB — CEA: CEA: 1.2 ng/mL

## 2019-12-20 ENCOUNTER — Encounter: Payer: Self-pay | Admitting: Cardiology

## 2019-12-20 DIAGNOSIS — Z7189 Other specified counseling: Secondary | ICD-10-CM | POA: Insufficient documentation

## 2019-12-20 NOTE — Progress Notes (Signed)
Cardiology Office Note   Date:  12/22/2019   ID:  Regina Wolfe, DOB 1956/02/01, MRN [id number]  PCP:  Joseph Art, MD  Cardiologist:   Minus Breeding, MD   Chief Complaint  Patient presents with  . New Patient (Initial Visit)    CAD.  Marland Kitchen Fatigue  . Edema    Left foot and ankle.      History of Present Illness: Regina Wolfe is a 64 y.o. female who presents for evaluation after cryptogenic stroke.  Dr. Curt Bears saw her in Feb with stroke.   These were L MCA and felt to be embolic.  She underwent implanted monitor.  She had a TEE without embolic source.     This is my first visit with her.  She has not had prior cardiac history.  She gets around slowly with a walker.  She said she has had 2 strokes and she has had increased limitations since the second 1.  Her daughter who is with her says that she has had some speech problems and some short-term memory problems.  She can ambulate through the house and ambulated into the appointment today with a walker.  She does not have any chest pressure, neck or arm discomfort.  She does not notice any palpitations, presyncope or syncope.  She has some chronic dyspnea on exertion but no PND or orthopnea.  She has had increased left lower extremity swelling recently.  She is not describing leg pain.  Of note she has been diagnosed with colon cancer and is going to meet with oncologist and get a follow-up CT scan tomorrow.  Antedating therapies have not decided that she needs surgery.   Past Medical History:  Diagnosis Date  . Colon polyp   . Cryptogenic stroke ([place])   . Diabetes mellitus without complication ([place])   . Neuropathy   . Uterine cancer [facility])     Past Surgical History:  Procedure Laterality Date  . ABDOMINAL HYSTERECTOMY     Total  . BIOPSY  12/15/2019   Procedure: BIOPSY;  Surgeon: Lavena Bullion, DO;  Location: [facility] ENDOSCOPY;  Service: Gastroenterology;;  . Kathleen Argue STUDY  10/22/2019   Procedure: BUBBLE STUDY;  Surgeon:  Sanda Klein, MD;  Location: [place];  Service: Cardiovascular;;  . [place]  . COLONOSCOPY WITH PROPOFOL N/A 12/15/2019   Procedure: COLONOSCOPY WITH PROPOFOL;  Surgeon: Lavena Bullion, DO;  Location: [facility] ENDOS[facility];  Service: Gastroenterology;  Laterality: N/A;  . HEMOSTASIS CLIP PLACEMENT  12/15/2019   Procedure: HEMOSTASIS CLIP PLACEMENT;  Surgeon: Lavena Bullion, DO;  Location: [facility] ENDOSCOPY;  Service: Gastroenterology;;  . HERNIA REPAIR     abdomin with mesh  . LOOP RECORDER INSERTION N/A 10/22/2019   Procedure: LOOP RECORDER INSERTION;  Surgeon: Constance Haw, MD;  Location: [facility] CV LAB;  Service: Cardiovascular;  Laterality: N/A;  . Right and Left Eye Surgery for Detached Retinas    . SCLEROTHERAPY  12/15/2019   Procedure: SCLEROTHERAPY;  Surgeon: Lavena Bullion, DO;  Location: [facility]OS[facility];  Service: Gastroenterology;;  . TEE WITHOUT CARDIOVERSION N/A 10/22/2019   Procedure: TRANSESOPHAGEAL ECHOCARDIOGRAM (TEE);  Surgeon: Sanda Klein, MD;  Location: [facility] ENDOSCOPY;  Service: Cardiovascular;  Laterality: N/A;     Current Outpatient Medications  Medication Sig Dispense Refill  . amitriptyline (ELAVIL) 75 MG tablet Take 1 tablet (75 mg total) by mouth at bedtime. 30 tablet 0  . aspirin 81 MG EC tablet Take 1 tablet by mouth daily.    Marland Kitchen  atorvastatin (LIPITOR) 40 MG tablet Take 1 tablet (40 mg total) by mouth at bedtime. 30 tablet 0  . carvedilol (COREG) 25 MG tablet Take 1 tablet (25 mg total) by mouth 2 (two) times daily. 60 tablet 0  . clopidogrel (PLAVIX) 75 MG tablet TAKE 1 TABLET BY MOUTH EVERY DAY 30 tablet 0  . docusate sodium (COLACE) 100 MG capsule Take 1 capsule (100 mg total) by mouth 3 (three) times daily. 10 capsule 0  . DULoxetine (CYMBALTA) 60 MG capsule Take 60 mg by mouth daily.    Marland Kitchen gabapentin (NEURONTIN) 600 MG tablet Take 600 mg by mouth at bedtime.    Marland Kitchen glyBURIDE-metformin (GLUCOVANCE) 5-500 MG tablet Take 1 tablet by  mouth 2 (two) times daily with a meal.    . Na Sulfate-K Sulfate-Mg Sulf 17.5-3.13-1.6 GM/177ML SOLN Take as directed 354 mL 0  . oxyCODONE-acetaminophen (PERCOCET) 10-325 MG tablet Take 1 tablet by mouth 4 (four) times daily as needed for pain.     . polyethylene glycol (MIRALAX / GLYCOLAX) 17 g packet Take 17 g by mouth daily.    Marland Kitchen senna (SENOKOT) 8.6 MG TABS tablet Take 2 tablets (17.2 mg total) by mouth at bedtime. 120 tablet 0  . vitamin B-12 (CYANOCOBALAMIN) 100 MCG tablet Take 100 mcg by mouth daily.    . Vitamin D, Ergocalciferol, (DRISDOL) 1.25 MG (50000 UNIT) CAPS capsule Take 50,000 Units by mouth every Sunday.      No current facility-administered medications for this visit.    Allergies:   Latex and Morphine and related    ROS:  Please see the history of present illness.   Otherwise, review of systems are positive for none.   All other systems are reviewed and negative.    PHYSICAL EXAM: VS:  BP 110/60 (BP Location: Left Arm, Patient Position: Sitting, Cuff Size: Large)   Pulse 100   Temp 97.8 F (36.6 C)   Ht 5\' 5"  (1.651 m)   Wt 275 lb (124.7 kg)   BMI 45.76 kg/m  , BMI Body mass index is 45.76 kg/m. GENERAL:  Well appearing HEENT:  Pupils equal round and reactive, fundi not visualized, oral mucosa unremarkable NECK:  No jugular venous distention, waveform within normal limits, carotid upstroke brisk and symmetric, no bruits, no thyromegaly LYMPHATICS:  No cervical, inguinal adenopathy LUNGS:  Clear to auscultation bilaterally BACK:  No CVA tenderness CHEST:  Unremarkable HEART:  PMI not displaced or sustained,S1 and S2 within normal limits, no S3, no S4, no clicks, no rubs, no murmurs ABD:  Flat, positive bowel sounds normal in frequency in pitch, no bruits, no rebound, no guarding, no midline pulsatile mass, no hepatomegaly, no splenomegaly EXT:  2 plus pulses throughout, left leg nonpitting edema with chronic venous stasis changes, bilateral varicosities , no  cyanosis no clubbing SKIN:  No rashes no nodules NEURO:  Cranial nerves II through XII grossly intact, motor grossly intact throughout PSYCH:  Cognitively intact, oriented to person place and time    EKG:  EKG is ordered today. The ekg ordered today demonstrates sinus tachycardia, rate 100, left axis deviation, QTC prolonged, poor anterior R wave progression, no change from previous   Recent Labs: 10/25/2019: ALT 27 12/09/2019: BUN 13; Creatinine, Ser 0.77; Hemoglobin 9.9; Platelets 446; Potassium 4.5; Sodium 137    Lipid Panel    Component Value Date/Time   CHOL 113 10/20/2019 0658   TRIG 63 10/20/2019 0658   HDL 43 10/20/2019 0658   CHOLHDL 2.6 10/20/2019 [id number]  VLDL 13 10/20/2019 0658   LDLCALC 57 10/20/2019 0658      Wt Readings from Last 3 Encounters:  12/22/19 275 lb (124.7 kg)  12/15/19 274 lb 14.6 oz (124.7 kg)  12/09/19 276 lb (125.2 kg)      Other studies Reviewed: Additional studies/ records that were reviewed today include: Hospital records. Review of the above records demonstrates:  Please see elsewhere in the note.     ASSESSMENT AND PLAN:  CVA: The patient was discharged after neurologic consultation on aspirin and Plavix.  There was no evidence of clot although they do report embolic phenomenon.  There has been no evidence of arrhythmia to date.  We called over and there is been no atrial fibrillation yet documented.  We will continue with current therapies and follow-up of her device.  LEFT LEG SWELLING: I will bring her back this afternoon for a venous Doppler to exclude DVT.  COLON CA: The patient will have further evaluation and might need surgery.  I would then discuss risk benefits of stopping Plavix and her preoperative risk as requested by the surgeons.  I do not think that her risk of surgery would be absolutely prohibitive he is resection is necessary.  COVID EDUCATION: She has not yet had vaccine but I would suggest this.   Current medicines  are reviewed at length with the patient today.  The patient does not have concerns regarding medicines.  The following changes have been made:  no change  Labs/ tests ordered today include:   Orders Placed This Encounter  Procedures  . EKG 12-Lead  . VAS [place] LOWER EXTREMITY VENOUS (DVT)     Disposition:   FU with me in six months.     Signed, Minus Breeding, MD  12/22/2019 1:08 PM    [place]

## 2019-12-22 ENCOUNTER — Ambulatory Visit (HOSPITAL_COMMUNITY)
Admission: RE | Admit: 2019-12-22 | Discharge: 2019-12-22 | Disposition: A | Discharge status: Home / Self Care | Payer: Medicare HMO | Source: Ambulatory Visit | Attending: Cardiovascular Disease | Admitting: Cardiovascular Disease

## 2019-12-22 ENCOUNTER — Telehealth: Payer: Self-pay

## 2019-12-22 ENCOUNTER — Ambulatory Visit: Payer: Medicare HMO | Admitting: Cardiology

## 2019-12-22 ENCOUNTER — Encounter: Payer: Self-pay | Admitting: Cardiology

## 2019-12-22 ENCOUNTER — Other Ambulatory Visit: Payer: Self-pay | Admitting: Hematology

## 2019-12-22 ENCOUNTER — Other Ambulatory Visit: Payer: Self-pay

## 2019-12-22 VITALS — BP 110/60 | HR 100 | Temp 97.8°F | Ht 65.0 in | Wt 275.0 lb

## 2019-12-22 DIAGNOSIS — K6389 Other specified diseases of intestine: Secondary | ICD-10-CM | POA: Insufficient documentation

## 2019-12-22 DIAGNOSIS — K635 Polyp of colon: Secondary | ICD-10-CM | POA: Insufficient documentation

## 2019-12-22 DIAGNOSIS — I639 Cerebral infarction, unspecified: Secondary | ICD-10-CM | POA: Diagnosis not present

## 2019-12-22 DIAGNOSIS — K625 Hemorrhage of anus and rectum: Secondary | ICD-10-CM | POA: Diagnosis present

## 2019-12-22 DIAGNOSIS — E118 Type 2 diabetes mellitus with unspecified complications: Secondary | ICD-10-CM

## 2019-12-22 DIAGNOSIS — R002 Palpitations: Secondary | ICD-10-CM

## 2019-12-22 DIAGNOSIS — M79605 Pain in left leg: Secondary | ICD-10-CM

## 2019-12-22 DIAGNOSIS — K5900 Constipation, unspecified: Secondary | ICD-10-CM | POA: Insufficient documentation

## 2019-12-22 DIAGNOSIS — M7989 Other specified soft tissue disorders: Secondary | ICD-10-CM

## 2019-12-22 DIAGNOSIS — R072 Precordial pain: Secondary | ICD-10-CM

## 2019-12-22 DIAGNOSIS — Z7189 Other specified counseling: Secondary | ICD-10-CM | POA: Diagnosis not present

## 2019-12-22 DIAGNOSIS — C187 Malignant neoplasm of sigmoid colon: Secondary | ICD-10-CM

## 2019-12-22 DIAGNOSIS — K626 Ulcer of anus and rectum: Secondary | ICD-10-CM | POA: Insufficient documentation

## 2019-12-22 NOTE — Telephone Encounter (Signed)
Spoke with Lonn Georgia, RN for Dr. Percival Spanish.  Advised transmission received.  HR is a little fast but regular.  No new episodes noted.

## 2019-12-22 NOTE — Telephone Encounter (Signed)
Nurse called from [facility] and says the doctor wants to know if anything is showing on patients loop recorder transmission she sent today. Please call her back [phone]

## 2019-12-22 NOTE — Progress Notes (Signed)
[place] CONSULT NOTE  Patient Care Team: Joseph Art, MD as PCP - General (Internal Medicine) Minus Breeding, MD as PCP - Cardiology (Cardiology)  HEME/ONC OVERVIEW: 1. Adenocarcinoma of distal sigmoid colon, stage TBD -12/2019:  A partially obstructing mass in the distal sigmoid colon (17-22cm from the anal verge); bx adenocarcinoma   Large distal sigmoid colon malignancy with suspicious transmural extension to the adjacent rectum on CT. No definite abdominal lymphadenopathy or metastatic disease.   TREATMENT SUMMARY:  PRN IV iron (Venof[place])  ASSESSMENT & PLAN:   Adenocarcinoma of distal sigmoid colon, stage TBD -I reviewed the patient's records in detail, including gastroenterology clinic notes, lab studies, imaging results, and the pathology reports -I independently reviewed the radiologic images of recent CT chest, abdomen and pelvis, and agree with findings documented -In summary, patient presented to her gastroenterologist in 11/2019 for evaluation of several weeks of intermittent hematochezia, varying in quality of blood.  She reportedly had a colonoscopy in [place] in 2020 that removed a large polyp, but the quality of the procedure was limited due to poor prep.  She underwent a repeat colonoscopy in 12/2019, which showed a partially obstructing mass in the distal sigmoid colon (approximately 17 to 22 cm from the anal verge), the biopsy of which showed adenocarcinoma.  CT CAP showed a large distal sigmoid colon malignancy with soft tissue thickening near the anterior rectal wall, concerning for transmural extension was involvement of the adjacent rectum.  There was no abdominal lymphadenopathy or evidence of metastatic disease.  Patient was referred to oncology for further evaluation. -I reviewed imaging and pathology results in detail with the patient, as well as the NCCN guideline -In absence of any definite metastatic disease, the location of the distal  sigmoid malignancy may be amenable for upfront surgical resection, but the transmural extension to the rectum will likely affect the extent of the surgical resection -Patient was recently seen by Dr. Dema Severin of colorectal surgery, who was concerned about the patient's perioperative risk, including recurrent stroke, given her recent stroke in 10/2019.   -I discussed the case in detail with Dr. Dema Severin, including CT results, and he recommended referring the patient to a tertiary academic center (such as [facility]), where there is experienced neurosurgical ICU that is capable of managing the postoperative care in light of the recent stroke -Therefore, I have placed an urgent referral for colorectal surgery at [facility] for the second opinion -If colorectal surgery at [facility] recommends against upfront surgery, then we can consider neoadjuvant chemotherapy to stabilize the disease and to allow more time for the patient to recover from her stroke before moving forward with surgical resection.  However, I emphasized that chemotherapy and/or chemoradiation would not be able to cure the cancer, and surgery is the only potentially curative option  Microcytic anemia -Hgb 9.9 on 12/09/2019; unable to obtain blood today due to difficulty with blood draws  -Given the sigmoid colon malignancy, she most likely has iron deficiency from chronic blood loss  -We discussed some of the risks, benefits, and alternatives of intravenous iron infusions.  -The patient is symptomatic from anemia and the iron level is critically low; as such, oral supplement is not sufficient to replete iron storage quickly and she will need IV iron to higher levels of iron faster for adequate hematopoiesis.  -Some of the side-effects to be expected including risks of infusion reactions, phlebitis, headaches, nausea and fatigue.   -The patient is willing to proceed.  We will tentatively schedule  the 1st dose next week, plan for 5 doses.  -Goal is to keep ferritin  level greater than 50.  Orders Placed This Encounter  Procedures  . CBC with Differential (Cancer Center Only)    Standing Status:   Future    Standing Expiration Date:   01/28/2021  . CMP ([place] only)    Standing Status:   Future    Standing Expiration Date:   01/28/2021  . CEA (IN HOUSE-[facility])    Standing Status:   Future    Standing Expiration Date:   12/24/2020  . Ambulatory referral to General Surgery    Referral Priority:   Urgent    Referral Type:   Surgical    Referral Reason:   Specialty Services Required    Requested Specialty:   General Surgery    Number of Visits Requested:   1   The total time spent in the encounter was 60 minutes, including face-to-face time with the patient, review of various tests results, order additional studies/medications, documentation, and coordination of care plan.   All questions were answered. The patient knows to call the clinic with any problems, questions or concerns. No barriers to learning was detected.  Return in ~2 weeks for labs and clinic follow-up.   Tish Men, MD 4/22/20213:00 PM  CHIEF COMPLAINTS/PURPOSE OF CONSULTATION:  "I am doing okay"  HISTORY OF PRESENTING ILLNESS:  Regina Wolfe 64 y.o. female is here because of newly diagnosed sigmoid adenocarcinoma.  Patient was referred to Dr. Enis Gash of gastroenterology in 11/2019 for evaluation of change in bowel habits.  She has history of constipation due to opioid medication for chronic pain, but in early 2021, she developed episodes of incontinence with intermittent hematochezia (quantity of blood varying from trace to mild).  She reportedly had a colonoscopy in [place] in 2020 that removed a colon polyp, but the quality of the procedure was limited due to poor prep.  She was diagnosed with a stroke in 10/2019, and soon afterwards, she underwent a repeat colonoscopy, which showed a partially obstructing mass in the distal sigmoid colon, the biopsy which showed  adenocarcinoma.  CT chest, abdomen and pelvis showed a large sigmoid colon mass with suspicious extension to the anterior rectum.  There was no abdominal lymphadenopathy or other evidence of metastatic disease.  Patient was referred to oncology for further evaluation.  Patient reports that she still has some residual weakness on the left side (left leg > left arm), as well as some short-term memory difficulty, but she is able to ambulate with a rollator.  She used to work as a Marine scientist for 40 years.  She does not smoke, drink or use illicit drugs.    REVIEW OF SYSTEMS:   Constitutional: ( - ) fevers, ( - )  chills , ( - ) night sweats Eyes: ( - ) blurriness of vision, ( - ) double vision, ( - ) watery eyes Ears, nose, mouth, throat, and face: ( - ) mucositis, ( - ) sore throat Respiratory: ( - ) cough, ( - ) dyspnea, ( - ) wheezes Cardiovascular: ( - ) palpitation, ( - ) chest discomfort, ( - ) lower extremity swelling Gastrointestinal:  ( - ) nausea, ( - ) heartburn, ( - ) change in bowel habits Skin: ( - ) abnormal skin rashes Lymphatics: ( - ) new lymphadenopathy, ( - ) easy bruising Neurological: ( - ) numbness, ( - ) tingling, ( + ) weaknesses Behavioral/Psych: ( - ) mood change, ( - )  new changes  All other systems were reviewed with the patient and are negative.  I have reviewed her chart and materials related to her cancer extensively and collaborated history with the patient. Summary of oncologic history is as follows: Oncology History  Adenocarcinoma of sigmoid colon ([place])  12/15/2019 Procedure   Colonoscopy: - Preparation of the colon was fair. - One 5 mm polyp in the cecum, removed with a cold snare. Resected and retrieved. Clip was placed. - One 5 mm polyp in the sigmoid colon, removed with a cold snare. Resected and retrieved. Clip was placed. - Malignant partially obstructing tumor in the distal sigmoid colon. Biopsied. Tattooed. - A single (solitary) ulcer in the proximal  rectum. Location and appearance seems most consistent with stercoral ulcer. Clip was placed. - There was significant looping of the colon. - Diverticulosis in the sigmoid colon. - Of note, the tissue was somewhat friable, including some friability with placement of each of the clips.   12/22/2019 Initial Diagnosis   Adenocarcinoma of sigmoid colon ([place]za-[place] II)   12/23/2019 Imaging   CT CAP:  IMPRESSION: 1. 5.3 x 4.0 cm soft tissue mass distal sigmoid colon consistent with the patient's known primary malignancy. There is a band of soft tissue extending from this mass posteriorly towards the rectum where a focal area of soft tissue thickening is evident in the anterior rectal wall. Imaging features raise concern for transmural extension with involvement of the adjacent rectum. 2. No evidence for metastatic disease in the liver. No lymphadenopathy in the chest, abdomen, or pelvis. 3. Tiny 2-3 mm pulmonary nodules in the periphery of the right lower lobe. These are likely benign but attention on follow-up recommended. 4. Small hiatal hernia. 5. Cholelithiasis. 6. Aortic Atherosclerosis (ICD10-I70.0).     MEDICAL HISTORY:  Past Medical History:  Diagnosis Date  . Colon polyp   . Cryptogenic stroke ([place])   . Diabetes mellitus without complication ([place])   . Neuropathy   . Uterine cancer [facility])     SURGICAL HISTORY: Past Surgical History:  Procedure Laterality Date  . ABDOMINAL HYSTERECTOMY     Total  . BIOPSY  12/15/2019   Procedure: BIOPSY;  Surgeon: Lavena Bullion, DO;  Location: [facility] ENDOSCOPY;  Service: Gastroenterology;;  . Kathleen Argue STUDY  10/22/2019   Procedure: BUBBLE STUDY;  Surgeon: Sanda Klein, MD;  Location: [place];  Service: Cardiovascular;;  . [place]  . COLONOSCOPY WITH PROPOFOL N/A 12/15/2019   Procedure: COLONOSCOPY WITH PROPOFOL;  Surgeon: Lavena Bullion, DO;  Location: [facility] ENDOSCOPY;  Service: Gastroenterology;  Laterality: N/A;  .  HEMOSTASIS CLIP PLACEMENT  12/15/2019   Procedure: HEMOSTASIS CLIP PLACEMENT;  Surgeon: Lavena Bullion, DO;  Location: [facility] ENDOSCOPY;  Service: Gastroenterology;;  . HERNIA REPAIR     abdomin with mesh  . LOOP RECORDER INSERTION N/A 10/22/2019   Procedure: LOOP RECORDER INSERTION;  Surgeon: Constance Haw, MD;  Location: [place] CV LAB;  Service: Cardiovascular;  Laterality: N/A;  . Right and Left Eye Surgery for Detached Retinas    . SCLEROTHERAPY  12/15/2019   Procedure: SCLEROTHERAPY;  Surgeon: Lavena Bullion, DO;  Location: [facility] ENDOS[facility];  Service: Gastroenterology;;  . TEE WITHOUT CARDIOVERSION N/A 10/22/2019   Procedure: TRANSESOPHAGEAL ECHOCARDIOGRAM (TEE);  Surgeon: Sanda Klein, MD;  Location: [facility] ENDOSCOPY;  Service: Cardiovascular;  Laterality: N/A;    SOCIAL HISTORY: Social History   Socioeconomic History  . Marital status: Divorced    Spouse name: Not on file  . Number  of children: 1  . Years of education: Not on file  . Highest education level: Not on file  Occupational History  . Occupation: retired Therapist, sports  Tobacco Use  . Smoking status: Never Smoker  . Smokeless tobacco: Never Used  . Tobacco comment: smoked for 1 year when she was 17 and quit  Substance and Sexual Activity  . Alcohol use: Never  . Drug use: Never  . Sexual activity: Not Currently  Other Topics Concern  . Not on file  Social History Narrative  . Not on file   Social Determinants of Health   Financial Resource Strain:   . Difficulty of Paying Living Expenses:   Food Insecurity:   . Worried About Charity fundraiser in the Last Year:   . Arboriculturist in the Last Year:   Transportation Needs:   . Film/video editor (Medical):   Marland Kitchen Lack of Transportation (Non-Medical):   Physical Activity:   . Days of Exercise per Week:   . Minutes of Exercise per Session:   Stress:   . Feeling of Stress :   Social Connections:   . Frequency of Communication with Friends and Family:    . Frequency of Social Gatherings with Friends and Family:   . Attends Religious Services:   . Active Member of Clubs or Organizations:   . Attends Archivist Meetings:   Marland Kitchen Marital Status:   Intimate Partner Violence:   . Fear of Current or Ex-Partner:   . Emotionally Abused:   Marland Kitchen Physically Abused:   . Sexually Abused:     FAMILY HISTORY: Family History  Problem Relation Age of Onset  . Diabetes Mother   . Heart attack Mother   . Ovarian cancer Mother   . Cancer Sister        different sisiters had between ovarian and uterine cancer  . Leukemia Sister   . Breast cancer Maternal Aunt   . Colon polyps Daughter   . Diabetes Daughter   . Diabetes Sister   . Heart disease Nephew     ALLERGIES:  is allergic to latex and morphine and related.  MEDICATIONS:  Current Outpatient Medications  Medication Sig Dispense Refill  . amitriptyline (ELAVIL) 75 MG tablet Take 1 tablet (75 mg total) by mouth at bedtime. 30 tablet 0  . aspirin 81 MG EC tablet Take 1 tablet by mouth daily.    Marland Kitchen atorvastatin (LIPITOR) 40 MG tablet Take 1 tablet (40 mg total) by mouth at bedtime. 30 tablet 0  . carvedilol (COREG) 25 MG tablet Take 1 tablet (25 mg total) by mouth 2 (two) times daily. 60 tablet 0  . clopidogrel (PLAVIX) 75 MG tablet TAKE 1 TABLET BY MOUTH EVERY DAY 30 tablet 0  . docusate sodium (COLACE) 100 MG capsule Take 1 capsule (100 mg total) by mouth 3 (three) times daily. 10 capsule 0  . DULoxetine (CYMBALTA) 60 MG capsule Take 60 mg by mouth daily.    Marland Kitchen gabapentin (NEURONTIN) 600 MG tablet Take 600 mg by mouth at bedtime.    Marland Kitchen glyBURIDE-metformin (GLUCOVANCE) 5-500 MG tablet Take 1 tablet by mouth 2 (two) times daily with a meal.    . Na Sulfate-K Sulfate-Mg Sulf 17.5-3.13-1.6 GM/177ML SOLN Take as directed 354 mL 0  . oxyCODONE-acetaminophen (PERCOCET) 10-325 MG tablet Take 1 tablet by mouth 4 (four) times daily as needed for pain.     . polyethylene glycol (MIRALAX / GLYCOLAX)  17 g packet Take 17  g by mouth daily.    Marland Kitchen senna (SENOKOT) 8.6 MG TABS tablet Take 2 tablets (17.2 mg total) by mouth at bedtime. 120 tablet 0  . vitamin B-12 (CYANOCOBALAMIN) 100 MCG tablet Take 100 mcg by mouth daily.    . Vitamin D, Ergocalciferol, (DRISDOL) 1.25 MG (50000 UNIT) CAPS capsule Take 50,000 Units by mouth every Sunday.      No current facility-administered medications for this visit.    PHYSICAL EXAMINATION: ECOG PERFORMANCE STATUS: 1-2   Vitals:   12/25/19 1413  BP: (!) 152/86  Pulse: 92  Resp: 20  Temp: (!) 97.1 F (36.2 C)  SpO2: 100%   Filed Weights   12/25/19 1413  Weight: 271 lb 12.8 oz (123.3 kg)    GENERAL: alert, no distress and comfortable, older than stated age, walks with a rollator  SKIN: skin color, texture, turgor are normal, no rashes or significant lesions EYES: conjunctiva are pink and non-injected, sclera clear OROPHARYNX: no exudate, no erythema; lips, buccal mucosa, and tongue normal  NECK: supple, non-tender LUNGS: clear to auscultation with normal breathing effort HEART: regular rate & rhythm, no murmurs, 1+ left lower extremity edema ABDOMEN: soft, non-tender, non-distended, normal bowel sounds Musculoskeletal: no cyanosis of digits and no clubbing  PSYCH: alert & oriented x 3, slightly slowed speech  LABORATORY DATA:  I have reviewed the data as listed Lab Results  Component Value Date   WBC 8.3 12/09/2019   HGB 9.9 (L) 12/09/2019   HCT 32.7 (L) 12/09/2019   MCV 80 12/09/2019   PLT 446 12/09/2019   Lab Results  Component Value Date   NA 137 12/09/2019   K 4.5 12/09/2019   CL 100 12/09/2019   CO2 21 12/09/2019    RADIOGRAPHIC STUDIES: I have personally reviewed the radiological images as listed and agreed with the findings in the report. DG Abd 1 View  Result Date: 12/02/2019 CLINICAL DATA:  Chronic constipation with blood in stool and abdominal pain for years. EXAM: ABDOMEN - 1 VIEW COMPARISON:  Radiographs 11/04/2019.  No other relevant comparison studies. FINDINGS: The bowel gas pattern is nonobstructive. There is no evidence of bowel wall thickening. A moderate to large amount of stool is again noted throughout the colon. There are multiple postsurgical clips within the lower abdomen and pelvis. Degenerative changes are present throughout the lumbar spine. IMPRESSION: No acute abdominal findings. Moderate to large amount of colonic stool, similar to previous study. Electronically Signed   By: Richardean Sale M.D.   On: 12/02/2019 16:38   CT CHEST W CONTRAST  Result Date: 12/24/2019 CLINICAL DATA:  Distal sigmoid colon cancer. Staging. EXAM: CT CHEST, ABDOMEN, AND PELVIS WITH CONTRAST TECHNIQUE: Multidetector CT imaging of the chest, abdomen and pelvis was performed following the standard protocol during bolus administration of intravenous contrast. CONTRAST:  181mL OMNIPAQUE IOHEXOL 300 MG/ML  SOLN COMPARISON:  None. FINDINGS: CT CHEST FINDINGS Cardiovascular: Heart size upper normal. Trace pericardial fluid evident. Atherosclerotic calcification is noted in the wall of the thoracic aorta. Mediastinum/Nodes: No mediastinal lymphadenopathy. There is no hilar lymphadenopathy. Small hiatal hernia. The esophagus has normal imaging features. There is no axillary lymphadenopathy. Lungs/Pleura: Tiny 2-3 mm pulmonary nodules are seen in the periphery of the right lower lobe on images 85 and 112 of series 6. Otherwise no suspicious pulmonary nodule or mass. No focal airspace consolidation. No pleural effusion. Musculoskeletal: No worrisome lytic or sclerotic osseous abnormality. CT ABDOMEN PELVIS FINDINGS Hepatobiliary: No suspicious focal abnormality within the liver parenchyma. 17 mm  gallstone evident with layering sludge or tiny stones in the gallbladder fundus. No intrahepatic or extrahepatic biliary dilation. Pancreas: No focal mass lesion. No dilatation of the main duct. No intraparenchymal cyst. No peripancreatic edema.  Spleen: No splenomegaly. No focal mass lesion. Adrenals/Urinary Tract: No adrenal nodule or mass. Kidneys unremarkable. No evidence for hydroureter. The urinary bladder appears normal for the degree of distention. Stomach/Bowel: Small hiatal hernia. Stomach is unremarkable. No gastric wall thickening. No evidence of outlet obstruction. Duodenum is normally positioned as is the ligament of Treitz. No small bowel wall thickening. No small bowel dilatation. The terminal ileum is normal. The appendix is not visualized, but there is no edema or inflammation in the region of the cecum. Hemostatic clip noted in the region of the splenic flexure. 5.3 x 4.0 cm soft tissue mass identified distal sigmoid colon (axial 101/series 2). Lobular projection from the lesion posteriorly on the left may be related to colonic fold although transmural extension at this location not excluded. There is a band of soft tissue extending from this mass posteriorly towards the rectum where a 2.5 x 2.0 cm focal area of soft tissue wall thickening is evident (see image 100/series 2). This apparent extension of soft tissue between the sigmoid colon and rectum is visual on sagittal image 92 of series 5. Vascular/Lymphatic: There is abdominal aortic atherosclerosis without aneurysm. There is no gastrohepatic or hepatoduodenal ligament lymphadenopathy. No retroperitoneal or mesenteric lymphadenopathy. No lymphadenopathy along the IMA distribution. No evidence for lymphadenopathy in the sigmoid mesocolon Reproductive: Uterus surgically absent.  There is no adnexal mass. Other: No intraperitoneal free fluid. Musculoskeletal: No worrisome lytic or sclerotic osseous abnormality. Degenerative changes noted in the lumbar spine and both hips. IMPRESSION: 1. 5.3 x 4.0 cm soft tissue mass distal sigmoid colon consistent with the patient's known primary malignancy. There is a band of soft tissue extending from this mass posteriorly towards the rectum where a  focal area of soft tissue thickening is evident in the anterior rectal wall. Imaging features raise concern for transmural extension with involvement of the adjacent rectum. 2. No evidence for metastatic disease in the liver. No lymphadenopathy in the chest, abdomen, or pelvis. 3. Tiny 2-3 mm pulmonary nodules in the periphery of the right lower lobe. These are likely benign but attention on follow-up recommended. 4. Small hiatal hernia. 5. Cholelithiasis. 6. Aortic Atherosclerosis (ICD10-I70.0). Electronically Signed   By: Misty Stanley M.D.   On: 12/24/2019 10:40   CT Abdomen Pelvis W Contrast  Result Date: 12/24/2019 CLINICAL DATA:  Distal sigmoid colon cancer. Staging. EXAM: CT CHEST, ABDOMEN, AND PELVIS WITH CONTRAST TECHNIQUE: Multidetector CT imaging of the chest, abdomen and pelvis was performed following the standard protocol during bolus administration of intravenous contrast. CONTRAST:  140mL OMNIPAQUE IOHEXOL 300 MG/ML  SOLN COMPARISON:  None. FINDINGS: CT CHEST FINDINGS Cardiovascular: Heart size upper normal. Trace pericardial fluid evident. Atherosclerotic calcification is noted in the wall of the thoracic aorta. Mediastinum/Nodes: No mediastinal lymphadenopathy. There is no hilar lymphadenopathy. Small hiatal hernia. The esophagus has normal imaging features. There is no axillary lymphadenopathy. Lungs/Pleura: Tiny 2-3 mm pulmonary nodules are seen in the periphery of the right lower lobe on images 85 and 112 of series 6. Otherwise no suspicious pulmonary nodule or mass. No focal airspace consolidation. No pleural effusion. Musculoskeletal: No worrisome lytic or sclerotic osseous abnormality. CT ABDOMEN PELVIS FINDINGS Hepatobiliary: No suspicious focal abnormality within the liver parenchyma. 17 mm gallstone evident with layering sludge or tiny stones in the  gallbladder fundus. No intrahepatic or extrahepatic biliary dilation. Pancreas: No focal mass lesion. No dilatation of the main duct. No  intraparenchymal cyst. No peripancreatic edema. Spleen: No splenomegaly. No focal mass lesion. Adrenals/Urinary Tract: No adrenal nodule or mass. Kidneys unremarkable. No evidence for hydroureter. The urinary bladder appears normal for the degree of distention. Stomach/Bowel: Small hiatal hernia. Stomach is unremarkable. No gastric wall thickening. No evidence of outlet obstruction. Duodenum is normally positioned as is the ligament of Treitz. No small bowel wall thickening. No small bowel dilatation. The terminal ileum is normal. The appendix is not visualized, but there is no edema or inflammation in the region of the cecum. Hemostatic clip noted in the region of the splenic flexure. 5.3 x 4.0 cm soft tissue mass identified distal sigmoid colon (axial 101/series 2). Lobular projection from the lesion posteriorly on the left may be related to colonic fold although transmural extension at this location not excluded. There is a band of soft tissue extending from this mass posteriorly towards the rectum where a 2.5 x 2.0 cm focal area of soft tissue wall thickening is evident (see image 100/series 2). This apparent extension of soft tissue between the sigmoid colon and rectum is visual on sagittal image 92 of series 5. Vascular/Lymphatic: There is abdominal aortic atherosclerosis without aneurysm. There is no gastrohepatic or hepatoduodenal ligament lymphadenopathy. No retroperitoneal or mesenteric lymphadenopathy. No lymphadenopathy along the IMA distribution. No evidence for lymphadenopathy in the sigmoid mesocolon Reproductive: Uterus surgically absent.  There is no adnexal mass. Other: No intraperitoneal free fluid. Musculoskeletal: No worrisome lytic or sclerotic osseous abnormality. Degenerative changes noted in the lumbar spine and both hips. IMPRESSION: 1. 5.3 x 4.0 cm soft tissue mass distal sigmoid colon consistent with the patient's known primary malignancy. There is a band of soft tissue extending from  this mass posteriorly towards the rectum where a focal area of soft tissue thickening is evident in the anterior rectal wall. Imaging features raise concern for transmural extension with involvement of the adjacent rectum. 2. No evidence for metastatic disease in the liver. No lymphadenopathy in the chest, abdomen, or pelvis. 3. Tiny 2-3 mm pulmonary nodules in the periphery of the right lower lobe. These are likely benign but attention on follow-up recommended. 4. Small hiatal hernia. 5. Cholelithiasis. 6. Aortic Atherosclerosis (ICD10-I70.0). Electronically Signed   By: Misty Stanley M.D.   On: 12/24/2019 10:40   CUP PACEART REMOTE DEVICE CHECK  Result Date: 12/02/2019 Carelink summary report received. Battery status OK. Normal device function. No new symptom episodes, tachy episodes, brady, or pause episodes. No new AF episodes. Monthly summary reports and ROV/PRN. Felisa Bonier, RN, MSN  VAS [place] LOWER EXTREMITY VENOUS (DVT)  Result Date: 12/22/2019  Lower Venous DVTStudy Indications: Left foot and ankle edema.  Risk Factors: Cancer (colon), obesity. Limitations: Body habitus. Technically difficult study. Comparison Study: 12/09/19 LLE venous duplex at [facility] showed no evidence of                   DVT in the left leg Performing Technologist: Mariane Masters RVT  Examination Guidelines: A complete evaluation includes B-mode imaging, spectral Doppler, color Doppler, and power Doppler as needed of all accessible portions of each vessel. Bilateral testing is considered an integral part of a complete examination. Limited examinations for reoccurring indications may be performed as noted. The reflux portion of the exam is performed with the patient in reverse Trendelenburg.  +---------+---------------+---------+-----------+----------+--------------+ RIGHT    CompressibilityPhasicitySpontaneityPropertiesThrombus Aging +---------+---------------+---------+-----------+----------+--------------+ CFV  Full           Yes      Yes                                 +---------+---------------+---------+-----------+----------+--------------+ SFJ      Full           Yes      Yes                                 +---------+---------------+---------+-----------+----------+--------------+ FV Prox  Full           Yes      Yes                                 +---------+---------------+---------+-----------+----------+--------------+ FV Mid   Full           Yes      Yes                                 +---------+---------------+---------+-----------+----------+--------------+ FV DistalFull           Yes      Yes                                 +---------+---------------+---------+-----------+----------+--------------+ PFV      Full                                                        +---------+---------------+---------+-----------+----------+--------------+ POP      Full           Yes      Yes                                 +---------+---------------+---------+-----------+----------+--------------+ PTV      Full                                                        +---------+---------------+---------+-----------+----------+--------------+ PERO     Full                                                        +---------+---------------+---------+-----------+----------+--------------+ Gastroc  Full                                                        +---------+---------------+---------+-----------+----------+--------------+   +---------+---------------+---------+-----------+----------+-----------------+ LEFT     CompressibilityPhasicitySpontaneityPropertiesThrombus Aging    +---------+---------------+---------+-----------+----------+-----------------+ CFV      Full  Yes      Yes                                    +---------+---------------+---------+-----------+----------+-----------------+ SFJ      Full           Yes      Yes                                     +---------+---------------+---------+-----------+----------+-----------------+ FV Prox  Full           Yes      Yes                                    +---------+---------------+---------+-----------+----------+-----------------+ FV Mid   Full           Yes      Yes                                    +---------+---------------+---------+-----------+----------+-----------------+ FV DistalFull           Yes      Yes                                    +---------+---------------+---------+-----------+----------+-----------------+ PFV      Full                                                           +---------+---------------+---------+-----------+----------+-----------------+ POP      Full           Yes      Yes                                    +---------+---------------+---------+-----------+----------+-----------------+ PTV      Full                                                           +---------+---------------+---------+-----------+----------+-----------------+ PERO                                                  poorly visualized +---------+---------------+---------+-----------+----------+-----------------+ Gastroc  Full                                                           +---------+---------------+---------+-----------+----------+-----------------+ GSV      Full           Yes  Yes                                    +---------+---------------+---------+-----------+----------+-----------------+ Left calf veins, especially the peroneal veins, were not well-visualized due to body habitus and edema. They appear patent via color Doppler.   Summary: Technically difficult study. RIGHT: - No evidence of deep vein thrombosis in the lower extremity. No indirect evidence of obstruction proximal to the inguinal ligament. - No cystic structure found in the popliteal fossa.  LEFT: - No evidence of deep vein  thrombosis in the lower extremity. No indirect evidence of obstruction proximal to the inguinal ligament. - No cystic structure found in the popliteal fossa.  *See table(s) above for measurements and observations. Electronically signed by Ida Rogue MD on 12/22/2019 at 4:46:52 PM.    Final    VAS [place] LOWER EXTREMITY VENOUS (DVT)  Result Date: 12/09/2019  Lower Venous DVTStudy Indications: Swelling, Pain, and discoloration of the lower calf area.  Limitations: Body habitus. Comparison Study: No priors. Performing Technologist: Oda Cogan RDMS, RVT  Examination Guidelines: A complete evaluation includes B-mode imaging, spectral Doppler, color Doppler, and power Doppler as needed of all accessible portions of each vessel. Bilateral testing is considered an integral part of a complete examination. Limited examinations for reoccurring indications may be performed as noted. The reflux portion of the exam is performed with the patient in reverse Trendelenburg.  +-----+---------------+---------+-----------+----------+--------------+ RIGHTCompressibilityPhasicitySpontaneityPropertiesThrombus Aging +-----+---------------+---------+-----------+----------+--------------+ CFV  Full           Yes      Yes                                 +-----+---------------+---------+-----------+----------+--------------+ SFJ  Full                                                        +-----+---------------+---------+-----------+----------+--------------+   +---------+---------------+---------+-----------+----------+--------------+ LEFT     CompressibilityPhasicitySpontaneityPropertiesThrombus Aging +---------+---------------+---------+-----------+----------+--------------+ CFV      Full           Yes      Yes                                 +---------+---------------+---------+-----------+----------+--------------+ SFJ      Full                                                         +---------+---------------+---------+-----------+----------+--------------+ FV Prox  Full                                                        +---------+---------------+---------+-----------+----------+--------------+ FV Mid   Full                                                        +---------+---------------+---------+-----------+----------+--------------+  FV DistalFull                                                        +---------+---------------+---------+-----------+----------+--------------+ PFV      Full                                                        +---------+---------------+---------+-----------+----------+--------------+ POP      Full           Yes      Yes                                 +---------+---------------+---------+-----------+----------+--------------+ PTV      Full                                                        +---------+---------------+---------+-----------+----------+--------------+ PERO     Full                                                        +---------+---------------+---------+-----------+----------+--------------+     Summary: RIGHT: - No evidence of common femoral vein obstruction.  LEFT: - There is no evidence of deep vein thrombosis in the lower extremity.  - No cystic structure found in the popliteal fossa.  *See table(s) above for measurements and observations. Electronically signed by Deitra Mayo MD on 12/09/2019 at 3:15:48 PM.    Final     PATHOLOGY: I have reviewed the pathology reports as documented in the oncologist history.

## 2019-12-22 NOTE — Patient Instructions (Signed)
Medication Instructions:  No changes *If you need a refill on your cardiac medications before your next appointment, please call your pharmacy*  Lab Work: None ordered this visit  Testing/Procedures: Your physician has requested that you have a lower extremity venous duplex at [facility] TODAY. This test is an ultrasound of the veins in the legs or arms. It looks at venous blood flow that carries blood from the heart to the legs or arms. Allow one hour for a Lower Venous exam. Allow thirty minutes for an Upper Venous exam. There are no restrictions or special instructions.  Follow-Up: At [facility], you and your health needs are our priority.  As part of our continuing mission to provide you with exceptional heart care, we have created designated Provider Care Teams.  These Care Teams include your primary Cardiologist (physician) and Advanced Practice Providers (APPs -  Physician Assistants and Nurse Practitioners) who all work together to provide you with the care you need, when you need it.  Your next appointment:   6 month(s)  You will receive a reminder letter in the mail two months in advance. If you don't receive a letter, please call our office to schedule the follow-up appointment.  The format for your next appointment:   In Person  Provider:   Minus Breeding, MD

## 2019-12-23 ENCOUNTER — Ambulatory Visit (HOSPITAL_COMMUNITY)
Admission: RE | Admit: 2019-12-23 | Discharge: 2019-12-23 | Disposition: A | Discharge status: Home / Self Care | Payer: Medicare HMO | Source: Ambulatory Visit | Attending: Gastroenterology | Admitting: Gastroenterology

## 2019-12-23 DIAGNOSIS — K625 Hemorrhage of anus and rectum: Secondary | ICD-10-CM

## 2019-12-23 DIAGNOSIS — K626 Ulcer of anus and rectum: Secondary | ICD-10-CM

## 2019-12-23 DIAGNOSIS — K6389 Other specified diseases of intestine: Secondary | ICD-10-CM

## 2019-12-23 DIAGNOSIS — K5900 Constipation, unspecified: Secondary | ICD-10-CM

## 2019-12-23 DIAGNOSIS — K635 Polyp of colon: Secondary | ICD-10-CM

## 2019-12-23 MED ORDER — IOHEXOL 300 MG/ML  SOLN
100.0000 mL | Freq: Once | INTRAMUSCULAR | Status: AC | PRN
  Administered 2019-12-23: 100 mL via INTRAVENOUS

## 2019-12-23 MED ORDER — SODIUM CHLORIDE (PF) 0.9 % IJ SOLN
INTRAMUSCULAR | Status: AC
  Filled 2019-12-23: qty 50

## 2019-12-25 ENCOUNTER — Ambulatory Visit: Payer: Medicare HMO | Admitting: Hematology & Oncology

## 2019-12-25 ENCOUNTER — Other Ambulatory Visit: Payer: Medicare HMO

## 2019-12-25 ENCOUNTER — Encounter: Payer: Self-pay | Admitting: *Deleted

## 2019-12-25 ENCOUNTER — Other Ambulatory Visit: Payer: Self-pay | Admitting: Hematology

## 2019-12-25 ENCOUNTER — Other Ambulatory Visit: Payer: Self-pay

## 2019-12-25 ENCOUNTER — Inpatient Hospital Stay (HOSPITAL_BASED_OUTPATIENT_CLINIC_OR_DEPARTMENT_OTHER): Payer: Medicare HMO | Admitting: Hematology

## 2019-12-25 ENCOUNTER — Encounter: Payer: Self-pay | Admitting: Hematology

## 2019-12-25 ENCOUNTER — Inpatient Hospital Stay: Payer: Medicare HMO | Attending: Hematology

## 2019-12-25 ENCOUNTER — Ambulatory Visit: Payer: Medicare HMO | Admitting: Hematology

## 2019-12-25 VITALS — BP 152/86 | HR 92 | Temp 97.1°F | Resp 20 | Ht 65.0 in | Wt 271.8 lb

## 2019-12-25 DIAGNOSIS — Z8049 Family history of malignant neoplasm of other genital organs: Secondary | ICD-10-CM

## 2019-12-25 DIAGNOSIS — C187 Malignant neoplasm of sigmoid colon: Secondary | ICD-10-CM

## 2019-12-25 DIAGNOSIS — Z8041 Family history of malignant neoplasm of ovary: Secondary | ICD-10-CM

## 2019-12-25 DIAGNOSIS — Z87891 Personal history of nicotine dependence: Secondary | ICD-10-CM | POA: Insufficient documentation

## 2019-12-25 DIAGNOSIS — Z803 Family history of malignant neoplasm of breast: Secondary | ICD-10-CM | POA: Diagnosis not present

## 2019-12-25 DIAGNOSIS — D509 Iron deficiency anemia, unspecified: Secondary | ICD-10-CM | POA: Diagnosis not present

## 2019-12-25 DIAGNOSIS — Z8542 Personal history of malignant neoplasm of other parts of uterus: Secondary | ICD-10-CM | POA: Diagnosis not present

## 2019-12-25 DIAGNOSIS — Z90710 Acquired absence of both cervix and uterus: Secondary | ICD-10-CM | POA: Diagnosis not present

## 2019-12-25 NOTE — Progress Notes (Signed)
Initial RN Navigator Patient Visit  Name: Regina Wolfe Date of Referral : 12/18/19 Diagnosis: Adenocarcinoma of sigmoid colon   Met with patient and her daughterTahlia, prior to their visit with MD. Hanley Seamen patient "Your Patient Navigator" handout which explains my role, areas in which I am able to help, and all the contact information for myself and the office. Also gave patient MD and Navigator business card. Reviewed with patient the general overview of expected course after initial diagnosis and time frame for all steps to be completed.  New patient packet given to patient which includes: orientation to office and staff; campus directory; education on My Chart and Advance Directives; and patient centered education on colorectal cancer.  Patient completed visit with Dr. Maylon Peppers  Patient needs an urgent referral to [facility]. Called [phone] and they will fax a records request form to initiate referral. Notified scheduler/referral coordinator of incoming fax. Per [facility], referral is to be faxed to [phone]. Records faxed. Our scjheduler/referral coordinator will follow up tomorrow.  Patient understands all follow up procedures and expectations. They have my number to reach out for any further clarification or additional needs.

## 2019-12-29 ENCOUNTER — Other Ambulatory Visit: Payer: Self-pay

## 2019-12-29 ENCOUNTER — Inpatient Hospital Stay: Payer: Medicare HMO

## 2019-12-29 VITALS — BP 147/67 | HR 94 | Temp 97.5°F | Resp 20

## 2019-12-29 DIAGNOSIS — C187 Malignant neoplasm of sigmoid colon: Secondary | ICD-10-CM | POA: Diagnosis not present

## 2019-12-29 MED ORDER — SODIUM CHLORIDE 0.9 % IV SOLN
200.0000 mg | Freq: Once | INTRAVENOUS | Status: AC
  Administered 2019-12-29: 200 mg via INTRAVENOUS
  Filled 2019-12-29: qty 200

## 2019-12-29 MED ORDER — SODIUM CHLORIDE 0.9 % IV SOLN
Freq: Once | INTRAVENOUS | Status: AC
  Filled 2019-12-29: qty 250

## 2019-12-29 NOTE — Patient Instructions (Signed)

## 2019-12-30 ENCOUNTER — Other Ambulatory Visit: Payer: Self-pay | Admitting: Family

## 2019-12-31 ENCOUNTER — Other Ambulatory Visit: Payer: Self-pay

## 2020-01-01 ENCOUNTER — Inpatient Hospital Stay: Payer: Medicare HMO

## 2020-01-02 ENCOUNTER — Telehealth: Payer: Self-pay

## 2020-01-02 LAB — CUP PACEART REMOTE DEVICE CHECK
Date Time Interrogation Session: 20210429195052
Implantable Pulse Generator Implant Date: 20210217

## 2020-01-02 NOTE — Telephone Encounter (Signed)
Linq alert for Tachy episodes that appears to be noise.    Spoke with pt daughter, at time of episodes pt was undergoing an MRI.

## 2020-01-05 ENCOUNTER — Other Ambulatory Visit: Payer: Self-pay | Admitting: *Deleted

## 2020-01-05 ENCOUNTER — Telehealth: Payer: Self-pay | Admitting: *Deleted

## 2020-01-05 ENCOUNTER — Ambulatory Visit (INDEPENDENT_AMBULATORY_CARE_PROVIDER_SITE_OTHER): Payer: Medicare HMO | Admitting: *Deleted

## 2020-01-05 ENCOUNTER — Inpatient Hospital Stay: Payer: Medicare HMO

## 2020-01-05 ENCOUNTER — Ambulatory Visit: Payer: Medicare HMO

## 2020-01-05 DIAGNOSIS — I6300 Cerebral infarction due to thrombosis of unspecified precerebral artery: Secondary | ICD-10-CM

## 2020-01-05 MED ORDER — PROCHLORPERAZINE MALEATE 10 MG PO TABS: 10.0000 mg | ORAL_TABLET | Freq: Three times a day (TID) | ORAL | 3 refills | Status: AC | PRN

## 2020-01-05 NOTE — Telephone Encounter (Signed)
Call received from patient's daughter Regina Wolfe, stating that patient is having slight nausea and is need of an anti-emetic.  Dr. Maylon Peppers notified and order received for pt to take Compazine 10 mg PO every eights hours as needed for nausea.  Call placed back to [facility] and notified her of MD orders.  Regina Wolfe has no other questions or concerns at this time.

## 2020-01-06 NOTE — Progress Notes (Signed)
Carelink Summary Report / Loop Recorder 

## 2020-01-07 ENCOUNTER — Telehealth: Payer: Self-pay

## 2020-01-07 ENCOUNTER — Encounter: Payer: Self-pay | Admitting: Gastroenterology

## 2020-01-07 ENCOUNTER — Ambulatory Visit: Payer: Medicare HMO | Admitting: Gastroenterology

## 2020-01-07 VITALS — BP 106/60 | HR 120 | Temp 97.6°F | Ht 65.0 in

## 2020-01-07 DIAGNOSIS — R112 Nausea with vomiting, unspecified: Secondary | ICD-10-CM

## 2020-01-07 DIAGNOSIS — R197 Diarrhea, unspecified: Secondary | ICD-10-CM

## 2020-01-07 DIAGNOSIS — K6389 Other specified diseases of intestine: Secondary | ICD-10-CM | POA: Diagnosis not present

## 2020-01-07 DIAGNOSIS — R1110 Vomiting, unspecified: Secondary | ICD-10-CM | POA: Insufficient documentation

## 2020-01-07 MED ORDER — PROMETHAZINE HCL 12.5 MG PO TABS: 12.5000 mg | ORAL_TABLET | Freq: Four times a day (QID) | ORAL | 1 refills | Status: AC | PRN

## 2020-01-07 NOTE — Patient Instructions (Addendum)
If you are age 65 or older, your body mass index should be between 23-30. Your Body mass index is 45.23 kg/m. If this is out of the aforementioned range listed, please consider follow up with your Primary Care Provider.  If you are age 45 or younger, your body mass index should be between 19-25. Your Body mass index is 45.23 kg/m. If this is out of the aformentioned range listed, please consider follow up with your Primary Care Provider.   We have sent the following medications to your pharmacy for you to pick up at your convenience: Phenergan 12.5 mg: Take every 6 hours as needed  You can use Pepto bismol and Imodium over-the-counter as needed.  Thank you for entrusting me with your care and for choosing [employer], Regina Wolfe, P.A. - C.

## 2020-01-07 NOTE — Progress Notes (Signed)
01/07/2020 Regina Wolfe [id number] [birth year]   HISTORY OF PRESENT ILLNESS:  This is a 64 year old female recently diagnosed with adenocarcinoma of the distal sigmoid colon.  She has had CT scan of the chest, abdomen, and pelvis that does not show any overt sign of metastatic disease at this time.  She is tied in with oncology and has seen them in the office and is scheduled to undergo surgery on June 15 with Dr. Sheryn Bison at [facility].  Is here today with complaints of diarrhea and nausea/vomiting.  The diarrhea has been an ongoing issue and was initially thought to be due to overflow, although they do not feel that that is the issue.  The nausea and vomiting has just been present for the past few days.  She was told by her PCP that it could possibly be a GI bug of some sort and was prescribed compazine, but that has not helped so she is asking for something else for nausea and vomiting.  No abdominal pain.  Past Medical History:  Diagnosis Date  . Colon polyp   . Cryptogenic stroke ([place])   . Diabetes mellitus without complication ([place])   . Neuropathy   . Uterine cancer [facility])    Past Surgical History:  Procedure Laterality Date  . ABDOMINAL HYSTERECTOMY     Total  . BIOPSY  12/15/2019   Procedure: BIOPSY;  Surgeon: Lavena Bullion, DO;  Location: [facility] ENDOSCOPY;  Service: Gastroenterology;;  . Kathleen Argue STUDY  10/22/2019   Procedure: BUBBLE STUDY;  Surgeon: Sanda Klein, MD;  Location: [facility];  Service: Cardiovascular;;  . [place]  . COLONOSCOPY WITH PROPOFOL N/A 12/15/2019   Procedure: COLONOSCOPY WITH PROPOFOL;  Surgeon: Lavena Bullion, DO;  Location: [facility] ENDOSCOPY;  Service: Gastroenterology;  Laterality: N/A;  . HEMOSTASIS CLIP PLACEMENT  12/15/2019   Procedure: HEMOSTASIS CLIP PLACEMENT;  Surgeon: Lavena Bullion, DO;  Location: [facility] ENDOSCOPY;  Service: Gastroenterology;;  . HERNIA REPAIR     abdomin with mesh  . LOOP RECORDER INSERTION N/A 10/22/2019   Procedure: LOOP RECORDER INSERTION;  Surgeon: Constance Haw, MD;  Location: [facility] CV LAB;  Service: Cardiovascular;  Laterality: N/A;  . Right and Left Eye Surgery for Detached Retinas    . SCLEROTHERAPY  12/15/2019   Procedure: SCLEROTHERAPY;  Surgeon: Lavena Bullion, DO;  Location: [facility] ENDOSCOPY;  Service: Gastroenterology;;  . TEE WITHOUT CARDIOVERSION N/A 10/22/2019   Procedure: TRANSESOPHAGEAL ECHOCARDIOGRAM (TEE);  Surgeon: Sanda Klein, MD;  Location: [facility] ENDOSCOPY;  Service: Cardiovascular;  Laterality: N/A;    reports that she has never smoked. She has never used smokeless tobacco. She reports that she does not drink alcohol or use drugs. family history includes Breast cancer in her maternal aunt; Cancer in her sister; Colon polyps in her daughter; Diabetes in her daughter, mother, and sister; Heart attack in her mother; Heart disease in her nephew; Leukemia in her sister; Ovarian cancer in her mother. Allergies  Allergen Reactions  . Latex Swelling    "Hands turned black with extreme swelling"  . Morphine And Related Hives      Outpatient Encounter Medications as of 01/07/2020  Medication Sig  . amitriptyline (ELAVIL) 75 MG tablet Take 1 tablet (75 mg total) by mouth at bedtime.  Marland Kitchen aspirin 81 MG EC tablet Take 1 tablet by mouth daily.  Marland Kitchen atorvastatin (LIPITOR) 40 MG tablet Take 1 tablet (40 mg total) by mouth at bedtime.  . carvedilol (COREG) 25 MG tablet  Take 1 tablet (25 mg total) by mouth 2 (two) times daily.  . clopidogrel (PLAVIX) 75 MG tablet TAKE 1 TABLET BY MOUTH EVERY DAY  . DULoxetine (CYMBALTA) 60 MG capsule Take 60 mg by mouth daily.  Marland Kitchen gabapentin (NEURONTIN) 600 MG tablet Take 600 mg by mouth at bedtime.  Marland Kitchen glyBURIDE-metformin (GLUCOVANCE) 5-500 MG tablet Take 1 tablet by mouth 2 (two) times daily with a meal.  . oxyCODONE-acetaminophen (PERCOCET) 10-325 MG tablet Take 1 tablet by mouth 4 (four) times daily as needed for pain.   Marland Kitchen prochlorperazine  (COMPAZINE) 10 MG tablet Take 1 tablet (10 mg total) by mouth every 8 (eight) hours as needed for nausea or vomiting.  . vitamin B-12 (CYANOCOBALAMIN) 100 MCG tablet Take 100 mcg by mouth daily.  . Vitamin D, Ergocalciferol, (DRISDOL) 1.25 MG (50000 UNIT) CAPS capsule Take 50,000 Units by mouth every Sunday.   . [DISCONTINUED] docusate sodium (COLACE) 100 MG capsule Take 1 capsule (100 mg total) by mouth 3 (three) times daily.  . [DISCONTINUED] Na Sulfate-K Sulfate-Mg Sulf 17.5-3.13-1.6 GM/177ML SOLN Take as directed  . [DISCONTINUED] polyethylene glycol (MIRALAX / GLYCOLAX) 17 g packet Take 17 g by mouth daily.  . [DISCONTINUED] senna (SENOKOT) 8.6 MG TABS tablet Take 2 tablets (17.2 mg total) by mouth at bedtime.   No facility-administered encounter medications on file as of 01/07/2020.     REVIEW OF SYSTEMS  : All other systems reviewed and negative except where noted in the History of Present Illness.   PHYSICAL EXAM: BP 106/60   Pulse (!) 120   Temp 97.6 F (36.4 C)   Ht 5\' 5"  (1.651 m)   BMI 45.23 kg/m  General: Well developed AA female in no acute distress; in wheelchair Head: Normocephalic and atraumatic Eyes:  Sclerae anicteric, conjunctiva pink. Ears: Normal auditory acuity Lungs: Clear throughout to auscultation; no increased WOB. Heart: Regular rate and rhythm; no M/R/G. Abdomen: Soft, non-distended.  BS present.  Non-tender. Musculoskeletal: Symmetrical with no gross deformities  Skin: No lesions on visible extremities Extremities: No edema  Neurological: Alert oriented x 4, grossly non-focal Psychological:  Alert and cooperative. Normal mood and affect  ASSESSMENT AND PLAN: *64 year old female recently diagnosed with adenocarcinoma of the distal sigmoid colon.  She has had CT scan of the chest, abdomen, and pelvis that does not show any overt sign of metastatic disease at this time.  She is tied in with oncology and has seen them in the office and is scheduled to  undergo surgery on June 15 with Dr. Sheryn Bison at [facility].  Is here today with complaints of diarrhea and nausea/vomiting.  The diarrhea has been an ongoing issue and was initially thought to be due to overflow, although they do not feel that that is the issue.  Advised that this is likely related to the malignancy and for now she can use Pepto-Bismol or Imodium as needed cautiously.  Nausea and vomiting has just been present for the past few days.  She is not obstructed, unsure what these symptoms represent (she was told possibly a GI bug of some sort), but is asking for something else for nausea and vomiting.  Phenergan was prescribed.  She was comfortable and not acutely ill-appearing at her visit today.   CC:  Joseph Art, MD

## 2020-01-07 NOTE — Telephone Encounter (Signed)
Likely a 1:1 tachycardia. Start diltiazem 120 if symptomatic or if she does not wish to start meds can wait and see if another episode occurs.

## 2020-01-07 NOTE — Progress Notes (Signed)
Agree with assessment with the following thoughts. Agree that malignancy is the likely cause of her symptoms. Would avoid using immodium for this, would rather her have loose stools than become obstructed - she has a large mass that was already causing partial obstruction a few weeks ago. She needs to proceed with her surgery as soon as she can.

## 2020-01-07 NOTE — Telephone Encounter (Signed)
Linq alert received- tachy episodes occurring last night.  Patient medications include ASA81mg , Carvedilol 25mg  BID.    Spoke with pt and her daughter.  Pt states hs feels fine today, however she was not feeling well last night, nauseous and dizzy. Per her daughter, pt was very anxious last night and fell around 0230 am, no injuries were sustained.    Pt was seen by GI MD this mornign to address GI symptoms associated with cancer treatment.    Pt is compliant with meds as ordered.

## 2020-01-08 ENCOUNTER — Telehealth: Payer: Self-pay

## 2020-01-08 ENCOUNTER — Inpatient Hospital Stay: Payer: Medicare HMO | Attending: Hematology

## 2020-01-08 ENCOUNTER — Other Ambulatory Visit: Payer: Self-pay

## 2020-01-08 VITALS — BP 110/67 | HR 109 | Resp 20

## 2020-01-08 DIAGNOSIS — K626 Ulcer of anus and rectum: Secondary | ICD-10-CM | POA: Insufficient documentation

## 2020-01-08 DIAGNOSIS — K921 Melena: Secondary | ICD-10-CM | POA: Insufficient documentation

## 2020-01-08 DIAGNOSIS — Z86010 Personal history of colonic polyps: Secondary | ICD-10-CM | POA: Insufficient documentation

## 2020-01-08 DIAGNOSIS — K5730 Diverticulosis of large intestine without perforation or abscess without bleeding: Secondary | ICD-10-CM | POA: Insufficient documentation

## 2020-01-08 DIAGNOSIS — C187 Malignant neoplasm of sigmoid colon: Secondary | ICD-10-CM | POA: Insufficient documentation

## 2020-01-08 DIAGNOSIS — R918 Other nonspecific abnormal finding of lung field: Secondary | ICD-10-CM | POA: Insufficient documentation

## 2020-01-08 DIAGNOSIS — R58 Hemorrhage, not elsewhere classified: Secondary | ICD-10-CM | POA: Insufficient documentation

## 2020-01-08 DIAGNOSIS — K8020 Calculus of gallbladder without cholecystitis without obstruction: Secondary | ICD-10-CM | POA: Insufficient documentation

## 2020-01-08 DIAGNOSIS — K449 Diaphragmatic hernia without obstruction or gangrene: Secondary | ICD-10-CM | POA: Insufficient documentation

## 2020-01-08 DIAGNOSIS — D508 Other iron deficiency anemias: Secondary | ICD-10-CM | POA: Insufficient documentation

## 2020-01-08 DIAGNOSIS — I700 Atherosclerosis of aorta: Secondary | ICD-10-CM | POA: Insufficient documentation

## 2020-01-08 MED ORDER — SODIUM CHLORIDE 0.9 % IV SOLN
200.0000 mg | Freq: Once | INTRAVENOUS | Status: AC
  Administered 2020-01-08: 200 mg via INTRAVENOUS
  Filled 2020-01-08: qty 10

## 2020-01-08 MED ORDER — SODIUM CHLORIDE 0.9 % IV SOLN
Freq: Once | INTRAVENOUS | Status: AC
  Filled 2020-01-08: qty 250

## 2020-01-08 NOTE — Telephone Encounter (Signed)
Zehr, Laban Emperor, PA-C  Timothy Lasso, RN  Please let the patient know that I spoke with Dr. Havery Moros and he would prefer that she not use Imodium for her diarrhea. Could still try pepto-bismol, however.

## 2020-01-08 NOTE — Patient Instructions (Signed)

## 2020-01-08 NOTE — Telephone Encounter (Signed)
The patient has been notified of this information and all questions answered.

## 2020-01-08 NOTE — Telephone Encounter (Signed)
-----   Message from Loralie Champagne, PA-C sent at 01/08/2020  9:37 AM EDT ----- Please let the patient know that I spoke with Dr. Havery Moros and he would prefer that she not use Imodium for her diarrhea.  Could still try pepto-bismol, however. ----- Message ----- From: Yetta Flock, MD Sent: 01/07/2020   6:45 PM EDT To: Loralie Champagne, PA-C    ----- Message ----- From: Loralie Champagne, PA-C Sent: 01/07/2020   1:21 PM EDT To: Yetta Flock, MD

## 2020-01-09 ENCOUNTER — Telehealth: Payer: Self-pay

## 2020-01-09 ENCOUNTER — Ambulatory Visit: Payer: Medicare HMO | Admitting: Gastroenterology

## 2020-01-09 NOTE — Telephone Encounter (Signed)
Received LINQ alert received for One tachy detection 01/07/20, 194 bpm max ventricular rate, 13 minutes duration. Presenting rhythm this transmission approx. 150 bpm. HR histogram shifted to the right. Called patient to assess, states she feels fine and has no complaints.   Carelink monitor has not transmission today, states they will check on it when they get home and make sure it is working. Offered assistance and phone number if needed.   Per Dr. Curt Bears note on 01/07/20, "Start diltiazem 120 if symptomatic or if she does not wish to start meds can wait and see if another episode occurs". Patients daughter Stephan [facility]), reports patients blood pressure runs low (104/60, last blood pressure) d/t cancer in blood. Patient and daughter agree to continue to monitor for another episode and wait on taking diltiazem. Daughter educated that if patient complains of chest pain, shortness of breath, dizziness or other acute symptoms, to go to the clos[facility] emergency department, verbalizes understanding.    PRESENTING

## 2020-01-13 ENCOUNTER — Telehealth: Payer: Self-pay | Admitting: Hematology

## 2020-01-13 ENCOUNTER — Inpatient Hospital Stay: Payer: Medicare HMO

## 2020-01-13 ENCOUNTER — Inpatient Hospital Stay (HOSPITAL_BASED_OUTPATIENT_CLINIC_OR_DEPARTMENT_OTHER): Payer: Medicare HMO | Admitting: Hematology

## 2020-01-13 ENCOUNTER — Encounter: Payer: Self-pay | Admitting: Hematology

## 2020-01-13 ENCOUNTER — Telehealth: Payer: Self-pay | Admitting: Cardiology

## 2020-01-13 ENCOUNTER — Other Ambulatory Visit: Payer: Self-pay

## 2020-01-13 ENCOUNTER — Encounter: Payer: Medicare HMO | Admitting: Physical Medicine and Rehabilitation

## 2020-01-13 VITALS — HR 59 | Temp 97.9°F | Resp 17 | Wt 262.1 lb

## 2020-01-13 VITALS — BP 116/60 | HR 94 | Resp 17

## 2020-01-13 DIAGNOSIS — D509 Iron deficiency anemia, unspecified: Secondary | ICD-10-CM | POA: Diagnosis not present

## 2020-01-13 DIAGNOSIS — C187 Malignant neoplasm of sigmoid colon: Secondary | ICD-10-CM

## 2020-01-13 LAB — CMP (CANCER CENTER ONLY)
ALT: 11 U/L (ref 0–44)
AST: 12 U/L — ABNORMAL LOW (ref 15–41)
Albumin: 3.4 g/dL — ABNORMAL LOW (ref 3.5–5.0)
Alkaline Phosphatase: 49 U/L (ref 38–126)
Anion gap: 9 (ref 5–15)
BUN: 16 mg/dL (ref 8–23)
CO2: 31 mmol/L (ref 22–32)
Calcium: 8.7 mg/dL — ABNORMAL LOW (ref 8.9–10.3)
Chloride: 101 mmol/L (ref 98–111)
Creatinine: 0.79 mg/dL (ref 0.44–1.00)
GFR, Est AFR Am: >60 mL/min (ref >60)
GFR, Estimated: >60 mL/min (ref >60)
Glucose, Bld: 302 mg/dL — ABNORMAL HIGH (ref 70–99)
Potassium: 3.8 mmol/L (ref 3.5–5.1)
Sodium: 141 mmol/L (ref 135–145)
Total Bilirubin: 0.4 mg/dL (ref 0.3–1.2)
Total Protein: 6.4 g/dL — ABNORMAL LOW (ref 6.5–8.1)

## 2020-01-13 LAB — CBC WITH DIFFERENTIAL (CANCER CENTER ONLY)
Abs Immature Granulocytes: 0.02 10*3/uL (ref 0.00–0.07)
Basophils Absolute: 0.1 10*3/uL (ref 0.0–0.1)
Basophils Relative: 1 %
Eosinophils Absolute: 0.4 10*3/uL (ref 0.0–0.5)
Eosinophils Relative: 5 %
HCT: 35.1 % — ABNORMAL LOW (ref 36.0–46.0)
Hemoglobin: 10.2 g/dL — ABNORMAL LOW (ref 12.0–15.0)
Immature Granulocytes: 0 %
Lymphocytes Relative: 24 %
Lymphs Abs: 2 10*3/uL (ref 0.7–4.0)
MCH: 23.6 pg — ABNORMAL LOW (ref 26.0–34.0)
MCHC: 29.1 g/dL — ABNORMAL LOW (ref 30.0–36.0)
MCV: 81.3 fL (ref 80.0–100.0)
Monocytes Absolute: 0.7 10*3/uL (ref 0.1–1.0)
Monocytes Relative: 8 %
Neutro Abs: 5.2 10*3/uL (ref 1.7–7.7)
Neutrophils Relative %: 62 %
Platelet Count: 431 10*3/uL — ABNORMAL HIGH (ref 150–400)
RBC: 4.32 MIL/uL (ref 3.87–5.11)
RDW: 16.3 % — ABNORMAL HIGH (ref 11.5–15.5)
WBC Count: 8.4 10*3/uL (ref 4.0–10.5)
nRBC: 0 % (ref 0.0–0.2)

## 2020-01-13 MED ORDER — SODIUM CHLORIDE 0.9 % IV SOLN
200.0000 mg | Freq: Once | INTRAVENOUS | Status: AC
  Administered 2020-01-13: 200 mg via INTRAVENOUS
  Filled 2020-01-13: qty 200

## 2020-01-13 MED ORDER — SODIUM CHLORIDE 0.9 % IV SOLN
Freq: Once | INTRAVENOUS | Status: AC
  Filled 2020-01-13: qty 250

## 2020-01-13 NOTE — Telephone Encounter (Signed)
° °  Pt's daughter called asking for surgical clearance for pt's upcoming procedure. Adv we need for the surgeon's office to call or fax clearance request form. She understood

## 2020-01-13 NOTE — Progress Notes (Signed)
[facility] OFFICE PROGRESS NOTE  Patient Care Team: Joseph Art, MD as PCP - General (Internal Medicine) Minus Breeding, MD as PCP - Cardiology (Cardiology)  HHEME/ONC OVERVIEW: 1. Adenocarcinoma of distal sigmoid colon, stage TBD -12/2019:  A partially obstructing mass in the distal sigmoid colon (17-22cm from the anal verge); bx adenocarcinoma   Large distal sigmoid colon malignancy with suspicious transmural extension to the adjacent rectum on CT. No definite abdominal lymphadenopathy or metastatic disease.   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 Sheryn Bison of surgical oncology at [facility], who recommended proceeding with upfront surgical resection.  However, due to the scheduling availability, she is currently scheduled for surgery on 02/17/2020. -Due to the patient's ongoing daily hematochezia, I expressed my concern that the surgery would be 5 to 6 weeks away, and she would be at high risk for developing complications, including severe bleeding and obstruction.  Therefore, I encouraged patient to contact her surgeon and see if there would be another surgeon available to perform the surgery sooner. -Patient expressed understanding, and will contact [facility] regarding their availability -Pending the surgical date, we will determine the follow-up appointment date to discuss adjuvant treatment options  Microcytic anemia -Likely multifactorial, including iron deficiency from chronic blood loss and anemia of chronic disease -Hgb 10.2 today, stable -Patient continues to report almost daily hematochezia of varying amount -She has received 2 doses of IV iron (Venofer) so far, and we will plan to administer additional 3 doses -See the management of local advanced adenocarcinoma of distal sigmoid colon above  No  orders of the defined types were placed in this encounter.  The total time spent in the encounter was 40 minutes, including face-to-face time with the patient, review of various tests results, order additional studies/medications, documentation, and coordination of care plan.   All questions were answered. The patient knows to call the clinic with any problems, questions or concerns. No barriers to learning was detected.  Return to clinic appointment to be determined, pending a date of surgery.  Tish Men, MD 5/11/202112:51 PM  CHIEF COMPLAINT: "I am okay"  INTERVAL HISTORY: Ms Schmoker returns clinic for follow-up of multiple advanced adenocarcinoma of distal sigmoid colon.  She recently met with Dr. Sheryn Bison of surgical oncology at [facility], who recommended proceeding with upfront surgical resection.  However, due to scheduling availability, her surgery is currently scheduled on 02/17/2020.  The patient reports that she still has almost daily hematochezia of varying amount, as well as limited appetite.  She tolerated the first 2 doses of IV iron infusion relatively well.  She denies any other complaint today.  REVIEW OF SYSTEMS:   Constitutional: ( - ) fevers, ( - )  chills , ( - ) night sweats Eyes: ( - ) blurriness of vision, ( - ) double vision, ( - ) watery eyes Ears, nose, mouth, throat, and face: ( - ) mucositis, ( - ) sore throat Respiratory: ( - ) cough, ( - ) dyspnea, ( - ) wheezes Cardiovascular: ( - ) palpitation, ( - ) chest discomfort, ( - ) lower extremity swelling Gastrointestinal:  ( - ) nausea, ( - ) heartburn, ( + ) change in bowel habits Skin: ( - ) abnormal skin rashes Lymphatics: ( - ) new lymphadenopathy, ( - ) easy bruising Neurological: ( - ) numbness, ( - )  tingling, ( - ) new weaknesses Behavioral/Psych: ( - ) mood change, ( - ) new changes  All other systems were reviewed with the patient and are negative.  SUMMARY OF ONCOLOGIC HISTORY: Oncology History   Adenocarcinoma of sigmoid colon ([place])  12/15/2019 Procedure   Colonoscopy: - Preparation of the colon was fair. - One 5 mm polyp in the cecum, removed with a cold snare. Resected and retrieved. Clip was placed. - One 5 mm polyp in the sigmoid colon, removed with a cold snare. Resected and retrieved. Clip was placed. - Malignant partially obstructing tumor in the distal sigmoid colon. Biopsied. Tattooed. - A single (solitary) ulcer in the proximal rectum. Location and appearance seems most consistent with stercoral ulcer. Clip was placed. - There was significant looping of the colon. - Diverticulosis in the sigmoid colon. - Of note, the tissue was somewhat friable, including some friability with placement of each of the clips.   12/22/2019 Initial Diagnosis   Adenocarcinoma of sigmoid colon ([place])   12/23/2019 Imaging   CT CAP:  IMPRESSION: 1. 5.3 x 4.0 cm soft tissue mass distal sigmoid colon consistent with the patient's known primary malignancy. There is a band of soft tissue extending from this mass posteriorly towards the rectum where a focal area of soft tissue thickening is evident in the anterior rectal wall. Imaging features raise concern for transmural extension with involvement of the adjacent rectum. 2. No evidence for metastatic disease in the liver. No lymphadenopathy in the chest, abdomen, or pelvis. 3. Tiny 2-3 mm pulmonary nodules in the periphery of the right lower lobe. These are likely benign but attention on follow-up recommended. 4. Small hiatal hernia. 5. Cholelithiasis. 6. Aortic Atherosclerosis (ICD10-I70.0).     I have reviewed the past medical history, past surgical history, social history and family history with the patient and they are unchanged from previous note.  ALLERGIES:  is allergic to latex and morphine and related.  MEDICATIONS:  Current Outpatient Medications  Medication Sig Dispense Refill  . amitriptyline (ELAVIL) 75 MG tablet Take  1 tablet (75 mg total) by mouth at bedtime. 30 tablet 0  . aspirin 81 MG EC tablet Take 1 tablet by mouth daily.    Marland Kitchen atorvastatin (LIPITOR) 40 MG tablet Take 1 tablet (40 mg total) by mouth at bedtime. 30 tablet 0  . carvedilol (COREG) 25 MG tablet Take 1 tablet (25 mg total) by mouth 2 (two) times daily. 60 tablet 0  . cephALEXin (KEFLEX) 250 MG capsule Take by mouth.    . clopidogrel (PLAVIX) 75 MG tablet TAKE 1 TABLET BY MOUTH EVERY DAY 30 tablet 0  . DULoxetine (CYMBALTA) 60 MG capsule Take 60 mg by mouth daily.    . furosemide (LASIX) 40 MG tablet Take 40 mg by mouth daily as needed.    . gabapentin (NEURONTIN) 600 MG tablet Take 600 mg by mouth at bedtime.    Marland Kitchen glipiZIDE (GLUCOTROL XL) 10 MG 24 hr tablet Take 10 mg by mouth 2 (two) times daily.    Marland Kitchen glyBURIDE-metformin (GLUCOVANCE) 5-500 MG tablet Take 1 tablet by mouth 2 (two) times daily with a meal.    . losartan (COZAAR) 100 MG tablet Take 100 mg by mouth daily.    . metFORMIN (GLUCOPHAGE-XR) 500 MG 24 hr tablet Take 1,000 mg by mouth 2 (two) times daily.    . metroNIDAZOLE (FLAGYL) 500 MG tablet Take one tablet at 3pm, 4pm & 10pm, the day before surgery.    . metroNIDAZOLE (  FLAGYL) 500 MG tablet Take 500 mg by mouth 3 (three) times daily.    Marland Kitchen neomycin (MYCIFRADIN) 500 MG tablet Take two tablets at 3pm, 4pm & 10pm, the day before surgery.    Marland Kitchen neomycin (MYCIFRADIN) 500 MG tablet Take 1,000 mg by mouth 3 (three) times daily.    . ondansetron (ZOFRAN) 4 MG tablet Take 4 mg by mouth every 8 (eight) hours as needed.    Marland Kitchen oxyCODONE-acetaminophen (PERCOCET) 10-325 MG tablet Take 1 tablet by mouth 4 (four) times daily as needed for pain.     Marland Kitchen prochlorperazine (COMPAZINE) 10 MG tablet Take 1 tablet (10 mg total) by mouth every 8 (eight) hours as needed for nausea or vomiting. 40 tablet 3  . promethazine (PHENERGAN) 12.5 MG tablet Take 1 tablet (12.5 mg total) by mouth every 6 (six) hours as needed for nausea or vomiting. 30 tablet 1  .  vitamin B-12 (CYANOCOBALAMIN) 100 MCG tablet Take 100 mcg by mouth daily.    . Vitamin D, Ergocalciferol, (DRISDOL) 1.25 MG (50000 UNIT) CAPS capsule Take 50,000 Units by mouth every Sunday.      No current facility-administered medications for this visit.    PHYSICAL EXAMINATION: ECOG PERFORMANCE STATUS: 1-2  Today's Vitals   01/13/20 1112 01/13/20 1123  Pulse: (!) 59   Resp: 17   Temp: 97.9 F (36.6 C)   SpO2: 96%   Weight: 262 lb 1.3 oz (118.9 kg)   PainSc: 0-No pain 0-No pain   Body mass index is 43.61 kg/m.  Filed Weights   01/13/20 1112  Weight: 262 lb 1.3 oz (118.9 kg)    GENERAL: alert, no distress, appear older than stated age  SKIN: skin color, texture, turgor are normal, no rashes or significant lesions EYES: conjunctiva are pink and non-injected, sclera clear OROPHARYNX: no exudate, no erythema; lips, buccal mucosa, and tongue normal  NECK: supple, non-tender LUNGS: clear to auscultation with normal breathing effort HEART: regular rate & rhythm and no murmurs and 1+ bilateral lower extremity edema ABDOMEN: soft, non-tender, non-distended, normal bowel sounds Musculoskeletal: no cyanosis of digits and no clubbing  PSYCH: alert & oriented x 3, fluent speech  LABORATORY DATA:  I have reviewed the data as listed    Component Value Date/Time   NA 141 01/13/2020 1048   NA 137 12/09/2019 1055   K 3.8 01/13/2020 1048   CL 101 01/13/2020 1048   CO2 31 01/13/2020 1048   GLUCOSE 302 (H) 01/13/2020 1048   BUN 16 01/13/2020 1048   BUN 13 12/09/2019 1055   CREATININE 0.79 01/13/2020 1048   CALCIUM 8.7 (L) 01/13/2020 1048   PROT 6.4 (L) 01/13/2020 1048   ALBUMIN 3.4 (L) 01/13/2020 1048   AST 12 (L) 01/13/2020 1048   ALT 11 01/13/2020 1048   ALKPHOS 49 01/13/2020 1048   BILITOT 0.4 01/13/2020 1048   GFRNONAA >60 01/13/2020 1048   GFRAA >60 01/13/2020 1048    No results found for: SPEP, UPEP  Lab Results  Component Value Date   WBC 8.4 01/13/2020    NEUTROABS 5.2 01/13/2020   HGB 10.2 (L) 01/13/2020   HCT 35.1 (L) 01/13/2020   MCV 81.3 01/13/2020   PLT 431 (H) 01/13/2020      Chemistry      Component Value Date/Time   NA 141 01/13/2020 1048   NA 137 12/09/2019 1055   K 3.8 01/13/2020 1048   CL 101 01/13/2020 1048   CO2 31 01/13/2020 1048   BUN 16 01/13/2020  1048   BUN 13 12/09/2019 1055   CREATININE 0.79 01/13/2020 1048      Component Value Date/Time   CALCIUM 8.7 (L) 01/13/2020 1048   ALKPHOS 49 01/13/2020 1048   AST 12 (L) 01/13/2020 1048   ALT 11 01/13/2020 1048   BILITOT 0.4 01/13/2020 1048       RADIOGRAPHIC STUDIES: I have personally reviewed the radiological images as listed below and agreed with the findings in the report. CT CHEST W CONTRAST  Result Date: 12/24/2019 CLINICAL DATA:  Distal sigmoid colon cancer. Staging. EXAM: CT CHEST, ABDOMEN, AND PELVIS WITH CONTRAST TECHNIQUE: Multidetector CT imaging of the chest, abdomen and pelvis was performed following the standard protocol during bolus administration of intravenous contrast. CONTRAST:  131m OMNIPAQUE IOHEXOL 300 MG/ML  SOLN COMPARISON:  None. FINDINGS: CT CHEST FINDINGS Cardiovascular: Heart size upper normal. Trace pericardial fluid evident. Atherosclerotic calcification is noted in the wall of the thoracic aorta. Mediastinum/Nodes: No mediastinal lymphadenopathy. There is no hilar lymphadenopathy. Small hiatal hernia. The esophagus has normal imaging features. There is no axillary lymphadenopathy. Lungs/Pleura: Tiny 2-3 mm pulmonary nodules are seen in the periphery of the right lower lobe on images 85 and 112 of series 6. Otherwise no suspicious pulmonary nodule or mass. No focal airspace consolidation. No pleural effusion. Musculoskeletal: No worrisome lytic or sclerotic osseous abnormality. CT ABDOMEN PELVIS FINDINGS Hepatobiliary: No suspicious focal abnormality within the liver parenchyma. 17 mm gallstone evident with layering sludge or tiny stones in  the gallbladder fundus. No intrahepatic or extrahepatic biliary dilation. Pancreas: No focal mass lesion. No dilatation of the main duct. No intraparenchymal cyst. No peripancreatic edema. Spleen: No splenomegaly. No focal mass lesion. Adrenals/Urinary Tract: No adrenal nodule or mass. Kidneys unremarkable. No evidence for hydroureter. The urinary bladder appears normal for the degree of distention. Stomach/Bowel: Small hiatal hernia. Stomach is unremarkable. No gastric wall thickening. No evidence of outlet obstruction. Duodenum is normally positioned as is the ligament of Treitz. No small bowel wall thickening. No small bowel dilatation. The terminal ileum is normal. The appendix is not visualized, but there is no edema or inflammation in the region of the cecum. Hemostatic clip noted in the region of the splenic flexure. 5.3 x 4.0 cm soft tissue mass identified distal sigmoid colon (axial 101/series 2). Lobular projection from the lesion posteriorly on the left may be related to colonic fold although transmural extension at this location not excluded. There is a band of soft tissue extending from this mass posteriorly towards the rectum where a 2.5 x 2.0 cm focal area of soft tissue wall thickening is evident (see image 100/series 2). This apparent extension of soft tissue between the sigmoid colon and rectum is visual on sagittal image 92 of series 5. Vascular/Lymphatic: There is abdominal aortic atherosclerosis without aneurysm. There is no gastrohepatic or hepatoduodenal ligament lymphadenopathy. No retroperitoneal or mesenteric lymphadenopathy. No lymphadenopathy along the IMA distribution. No evidence for lymphadenopathy in the sigmoid mesocolon Reproductive: Uterus surgically absent.  There is no adnexal mass. Other: No intraperitoneal free fluid. Musculoskeletal: No worrisome lytic or sclerotic osseous abnormality. Degenerative changes noted in the lumbar spine and both hips. IMPRESSION: 1. 5.3 x 4.0 cm  soft tissue mass distal sigmoid colon consistent with the patient's known primary malignancy. There is a band of soft tissue extending from this mass posteriorly towards the rectum where a focal area of soft tissue thickening is evident in the anterior rectal wall. Imaging features raise concern for transmural extension with involvement of the  adjacent rectum. 2. No evidence for metastatic disease in the liver. No lymphadenopathy in the chest, abdomen, or pelvis. 3. Tiny 2-3 mm pulmonary nodules in the periphery of the right lower lobe. These are likely benign but attention on follow-up recommended. 4. Small hiatal hernia. 5. Cholelithiasis. 6. Aortic Atherosclerosis (ICD10-I70.0). Electronically Signed   By: Misty Stanley M.D.   On: 12/24/2019 10:40   CT Abdomen Pelvis W Contrast  Result Date: 12/24/2019 CLINICAL DATA:  Distal sigmoid colon cancer. Staging. EXAM: CT CHEST, ABDOMEN, AND PELVIS WITH CONTRAST TECHNIQUE: Multidetector CT imaging of the chest, abdomen and pelvis was performed following the standard protocol during bolus administration of intravenous contrast. CONTRAST:  116m OMNIPAQUE IOHEXOL 300 MG/ML  SOLN COMPARISON:  None. FINDINGS: CT CHEST FINDINGS Cardiovascular: Heart size upper normal. Trace pericardial fluid evident. Atherosclerotic calcification is noted in the wall of the thoracic aorta. Mediastinum/Nodes: No mediastinal lymphadenopathy. There is no hilar lymphadenopathy. Small hiatal hernia. The esophagus has normal imaging features. There is no axillary lymphadenopathy. Lungs/Pleura: Tiny 2-3 mm pulmonary nodules are seen in the periphery of the right lower lobe on images 85 and 112 of series 6. Otherwise no suspicious pulmonary nodule or mass. No focal airspace consolidation. No pleural effusion. Musculoskeletal: No worrisome lytic or sclerotic osseous abnormality. CT ABDOMEN PELVIS FINDINGS Hepatobiliary: No suspicious focal abnormality within the liver parenchyma. 17 mm gallstone  evident with layering sludge or tiny stones in the gallbladder fundus. No intrahepatic or extrahepatic biliary dilation. Pancreas: No focal mass lesion. No dilatation of the main duct. No intraparenchymal cyst. No peripancreatic edema. Spleen: No splenomegaly. No focal mass lesion. Adrenals/Urinary Tract: No adrenal nodule or mass. Kidneys unremarkable. No evidence for hydroureter. The urinary bladder appears normal for the degree of distention. Stomach/Bowel: Small hiatal hernia. Stomach is unremarkable. No gastric wall thickening. No evidence of outlet obstruction. Duodenum is normally positioned as is the ligament of Treitz. No small bowel wall thickening. No small bowel dilatation. The terminal ileum is normal. The appendix is not visualized, but there is no edema or inflammation in the region of the cecum. Hemostatic clip noted in the region of the splenic flexure. 5.3 x 4.0 cm soft tissue mass identified distal sigmoid colon (axial 101/series 2). Lobular projection from the lesion posteriorly on the left may be related to colonic fold although transmural extension at this location not excluded. There is a band of soft tissue extending from this mass posteriorly towards the rectum where a 2.5 x 2.0 cm focal area of soft tissue wall thickening is evident (see image 100/series 2). This apparent extension of soft tissue between the sigmoid colon and rectum is visual on sagittal image 92 of series 5. Vascular/Lymphatic: There is abdominal aortic atherosclerosis without aneurysm. There is no gastrohepatic or hepatoduodenal ligament lymphadenopathy. No retroperitoneal or mesenteric lymphadenopathy. No lymphadenopathy along the IMA distribution. No evidence for lymphadenopathy in the sigmoid mesocolon Reproductive: Uterus surgically absent.  There is no adnexal mass. Other: No intraperitoneal free fluid. Musculoskeletal: No worrisome lytic or sclerotic osseous abnormality. Degenerative changes noted in the lumbar  spine and both hips. IMPRESSION: 1. 5.3 x 4.0 cm soft tissue mass distal sigmoid colon consistent with the patient's known primary malignancy. There is a band of soft tissue extending from this mass posteriorly towards the rectum where a focal area of soft tissue thickening is evident in the anterior rectal wall. Imaging features raise concern for transmural extension with involvement of the adjacent rectum. 2. No evidence for metastatic disease in the  liver. No lymphadenopathy in the chest, abdomen, or pelvis. 3. Tiny 2-3 mm pulmonary nodules in the periphery of the right lower lobe. These are likely benign but attention on follow-up recommended. 4. Small hiatal hernia. 5. Cholelithiasis. 6. Aortic Atherosclerosis (ICD10-I70.0). Electronically Signed   By: Misty Stanley M.D.   On: 12/24/2019 10:40   CUP PACEART REMOTE DEVICE CHECK  Result Date: 01/02/2020 [facility] summary report received. Battery status OK. Normal device function. One tachy detection, appears ST with T-wave oversensing. No new symptom episodes, brady, or pause episodes. No new AF episodes. Monthly summary reports and ROV/PRN. Felisa Bonier, RN, MSN  VAS [place] LOWER EXTREMITY VENOUS (DVT)  Result Date: 12/22/2019  Lower Venous DVTStudy Indications: Left foot and ankle edema.  Risk Factors: Cancer (colon), obesity. Limitations: Body habitus. Technically difficult study. Comparison Study: 12/09/19 LLE venous duplex at [facility] showed no evidence of                   DVT in the left leg Performing Technologist: Mariane Masters RVT  Examination Guidelines: A complete evaluation includes B-mode imaging, spectral Doppler, color Doppler, and power Doppler as needed of all accessible portions of each vessel. Bilateral testing is considered an integral part of a complete examination. Limited examinations for reoccurring indications may be performed as noted. The reflux portion of the exam is performed with the patient in reverse Trendelenburg.   +---------+---------------+---------+-----------+----------+--------------+ RIGHT    CompressibilityPhasicitySpontaneityPropertiesThrombus Aging +---------+---------------+---------+-----------+----------+--------------+ CFV      Full           Yes      Yes                                 +---------+---------------+---------+-----------+----------+--------------+ SFJ      Full           Yes      Yes                                 +---------+---------------+---------+-----------+----------+--------------+ FV Prox  Full           Yes      Yes                                 +---------+---------------+---------+-----------+----------+--------------+ FV Mid   Full           Yes      Yes                                 +---------+---------------+---------+-----------+----------+--------------+ FV DistalFull           Yes      Yes                                 +---------+---------------+---------+-----------+----------+--------------+ PFV      Full                                                        +---------+---------------+---------+-----------+----------+--------------+ POP      Full  Yes      Yes                                 +---------+---------------+---------+-----------+----------+--------------+ PTV      Full                                                        +---------+---------------+---------+-----------+----------+--------------+ PERO     Full                                                        +---------+---------------+---------+-----------+----------+--------------+ Gastroc  Full                                                        +---------+---------------+---------+-----------+----------+--------------+   +---------+---------------+---------+-----------+----------+-----------------+ LEFT     CompressibilityPhasicitySpontaneityPropertiesThrombus Aging     +---------+---------------+---------+-----------+----------+-----------------+ CFV      Full           Yes      Yes                                    +---------+---------------+---------+-----------+----------+-----------------+ SFJ      Full           Yes      Yes                                    +---------+---------------+---------+-----------+----------+-----------------+ FV Prox  Full           Yes      Yes                                    +---------+---------------+---------+-----------+----------+-----------------+ FV Mid   Full           Yes      Yes                                    +---------+---------------+---------+-----------+----------+-----------------+ FV DistalFull           Yes      Yes                                    +---------+---------------+---------+-----------+----------+-----------------+ PFV      Full                                                           +---------+---------------+---------+-----------+----------+-----------------+ POP  Full           Yes      Yes                                    +---------+---------------+---------+-----------+----------+-----------------+ PTV      Full                                                           +---------+---------------+---------+-----------+----------+-----------------+ PERO                                                  poorly visualized +---------+---------------+---------+-----------+----------+-----------------+ Gastroc  Full                                                           +---------+---------------+---------+-----------+----------+-----------------+ GSV      Full           Yes      Yes                                    +---------+---------------+---------+-----------+----------+-----------------+ Left calf veins, especially the peroneal veins, were not well-visualized due to body habitus and edema. They appear patent via color  Doppler.   Summary: Technically difficult study. RIGHT: - No evidence of deep vein thrombosis in the lower extremity. No indirect evidence of obstruction proximal to the inguinal ligament. - No cystic structure found in the popliteal fossa.  LEFT: - No evidence of deep vein thrombosis in the lower extremity. No indirect evidence of obstruction proximal to the inguinal ligament. - No cystic structure found in the popliteal fossa.  *See table(s) above for measurements and observations. Electronically signed by Ida Rogue MD on 12/22/2019 at 6:46:52 PM.    Final

## 2020-01-13 NOTE — Telephone Encounter (Signed)
Appointments scheduled calendar printed patient to get contrast for CT from imaging department here at [facility]

## 2020-01-13 NOTE — Patient Instructions (Signed)

## 2020-01-14 ENCOUNTER — Encounter: Payer: Self-pay | Admitting: *Deleted

## 2020-01-14 LAB — CEA (IN HOUSE-CHCC): CEA (CHCC-In House): 1.28 ng/mL (ref 0.00–5.00)

## 2020-01-15 ENCOUNTER — Telehealth: Payer: Self-pay

## 2020-01-15 NOTE — Telephone Encounter (Signed)
   [facility] Pre-operative Risk Assessment    Request for surgical clearance:  1. What type of surgery is being performed? LAPAROSCOPIC SIGMOID COLECTOMY, PARTIAL PROCTECTOMY, POTENTIAL DOME OF BLADDER RESECTION, AND END  COLOSTOMY   2. When is this surgery scheduled? 02-17-2020   3. What type of clearance is required (medical clearance vs. Pharmacy clearance to hold med vs. Both)? BOTH  4. Are there any medications that need to be held prior to surgery and how long? PLAVIX 5-7 DAYS PRIOR   5. Practice name and name of physician performing surgery? [facility]   6. What is the office phone number? [phone]   7.   What is the office fax number? [facility] [phone]  8.   Anesthesia type (None, local, MAC, general) ? NOT LISTED

## 2020-01-16 ENCOUNTER — Telehealth: Payer: Self-pay | Admitting: *Deleted

## 2020-01-16 ENCOUNTER — Other Ambulatory Visit: Payer: Self-pay

## 2020-01-16 ENCOUNTER — Inpatient Hospital Stay: Payer: Medicare HMO

## 2020-01-16 VITALS — BP 108/44 | HR 88 | Temp 97.3°F | Resp 17

## 2020-01-16 DIAGNOSIS — C187 Malignant neoplasm of sigmoid colon: Secondary | ICD-10-CM

## 2020-01-16 MED ORDER — SODIUM CHLORIDE 0.9 % IV SOLN
200.0000 mg | Freq: Once | INTRAVENOUS | Status: AC
  Administered 2020-01-16: 200 mg via INTRAVENOUS
  Filled 2020-01-16: qty 200

## 2020-01-16 MED ORDER — SODIUM CHLORIDE 0.9 % IV SOLN
Freq: Once | INTRAVENOUS | Status: AC
  Filled 2020-01-16: qty 250

## 2020-01-16 NOTE — Patient Instructions (Signed)

## 2020-01-16 NOTE — Telephone Encounter (Signed)
Patient requested that I call her daughter, Stephan Minister, to discuss appointments at [facility]. Patient is meeting next Tuesday, 5/18, with the surgeon. Surgery is tentatively scheduled for June 4th. I told the daughter I would give this message to dr. Maylon Peppers.

## 2020-01-16 NOTE — Progress Notes (Signed)
Pt declined to stay for 30 minute post infusion observation period. Pt states she has tolerated medication prior without issue. Pt aware to call clinic with any questions or concerns. Pt verbalized understanding and had no further questions.

## 2020-01-19 NOTE — Telephone Encounter (Signed)
   Primary Cardiologist: Minus Breeding, MD  Chart reviewed as part of pre-operative protocol coverage. Patient with hx of cryptogenic stroke in 10/2019 >> seen by Dr. Curt Bears and underwent implantable loop recorder placement. Most recently established care with Dr. Percival Spanish 12/22/19. 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  I would then discuss risk benefits of stopping Plavix and her preoperative risk as requested by the surgeons.  I do not think that her risk of surgery would be absolutely prohibitive he is resection is necessary".   Dr. Percival Spanish, Please give your recommendation regarding clearance and Plavix. Please forward your response to [facility].   Thank you       Leanor Kail, PA 01/19/2020, 3:23 PM

## 2020-01-19 NOTE — Telephone Encounter (Signed)
The patient had no unstable signs or symptoms.  She has a limited functional capacity but can ambulate without symptoms.  According to the [facility] Criteria she is at moderate risk (6.6%) for cardiovascular complications with the planned surgery.  This would not be prohibitive and Plavix could be held as needed 5 days prior.  It sounds like this is a curative and necessary surgery.  The patient should discuss this with the surgical team.

## 2020-01-22 ENCOUNTER — Inpatient Hospital Stay: Payer: Medicare HMO

## 2020-01-22 ENCOUNTER — Other Ambulatory Visit: Payer: Self-pay

## 2020-01-22 VITALS — BP 106/52 | HR 88 | Temp 97.3°F | Resp 19

## 2020-01-22 DIAGNOSIS — C187 Malignant neoplasm of sigmoid colon: Secondary | ICD-10-CM

## 2020-01-22 MED ORDER — SODIUM CHLORIDE 0.9 % IV SOLN
Freq: Once | INTRAVENOUS | Status: AC
  Filled 2020-01-22: qty 250

## 2020-01-22 MED ORDER — SODIUM CHLORIDE 0.9 % IV SOLN
200.0000 mg | Freq: Once | INTRAVENOUS | Status: AC
  Administered 2020-01-22: 200 mg via INTRAVENOUS
  Filled 2020-01-22: qty 200

## 2020-01-22 NOTE — Patient Instructions (Signed)

## 2020-01-28 ENCOUNTER — Encounter: Payer: Medicare HMO | Admitting: Physical Medicine and Rehabilitation

## 2020-01-29 ENCOUNTER — Encounter: Payer: Self-pay | Admitting: *Deleted

## 2020-01-29 NOTE — Progress Notes (Signed)
Received call back from [place]. Patient is transferring care to [facility]. Will discontinue follow up.  Stephan Minister has my number and is encouraged to call if there is anything we can do for her mom.

## 2020-01-29 NOTE — Progress Notes (Signed)
Reached out to patient's daughter Stephan Minister. Patient had been seen at [facility] for surgical consult. On review of [facility] records, patient has also seen a medical and radiation oncologist. Currently she has not further follow up appointments at this office.   Message left on [name]'s voice mail to determine if patient was transferring her care to [facility], or if we should arrange for follow up here.

## 2020-02-03 ENCOUNTER — Ambulatory Visit (INDEPENDENT_AMBULATORY_CARE_PROVIDER_SITE_OTHER): Payer: Medicare HMO | Admitting: *Deleted

## 2020-02-03 DIAGNOSIS — I6300 Cerebral infarction due to thrombosis of unspecified precerebral artery: Secondary | ICD-10-CM

## 2020-02-03 LAB — CUP PACEART REMOTE DEVICE CHECK
Date Time Interrogation Session: 20210530195227
Implantable Pulse Generator Implant Date: 20210217

## 2020-02-04 NOTE — Progress Notes (Signed)
Carelink Summary Report / Loop Recorder 

## 2020-03-05 ENCOUNTER — Ambulatory Visit (INDEPENDENT_AMBULATORY_CARE_PROVIDER_SITE_OTHER): Payer: Medicare HMO | Admitting: *Deleted

## 2020-03-05 DIAGNOSIS — I6300 Cerebral infarction due to thrombosis of unspecified precerebral artery: Secondary | ICD-10-CM | POA: Diagnosis not present

## 2020-03-06 LAB — CUP PACEART REMOTE DEVICE CHECK
Date Time Interrogation Session: 20210702195359
Implantable Pulse Generator Implant Date: 20210217

## 2020-03-09 NOTE — Progress Notes (Signed)
Carelink Summary Report / Loop Recorder 

## 2020-04-10 LAB — CUP PACEART REMOTE DEVICE CHECK
Date Time Interrogation Session: 20210804194923
Implantable Pulse Generator Implant Date: 20210217

## 2020-04-12 ENCOUNTER — Ambulatory Visit (INDEPENDENT_AMBULATORY_CARE_PROVIDER_SITE_OTHER): Payer: Medicare HMO | Admitting: *Deleted

## 2020-04-12 DIAGNOSIS — I6300 Cerebral infarction due to thrombosis of unspecified precerebral artery: Secondary | ICD-10-CM | POA: Diagnosis not present

## 2020-04-13 NOTE — Progress Notes (Signed)
Carelink Summary Report / Loop Recorder 

## 2020-04-22 IMAGING — CT CT ANGIO HEAD
2 of 11 series · 7 of 33 positions shown · IV contrast (omnipaque)
Comparison: MRI 10/19/2019

CLINICAL DATA: Confusion. Incontinence. Left MCA strokes seen by
MRI.

EXAM:
CT ANGIOGRAPHY HEAD AND NECK
TECHNIQUE: Multidetector CT imaging of the head and neck was performed using
the standard protocol during bolus administration of intravenous
contrast. Multiplanar CT image reconstructions and MIPs were
obtained to evaluate the vascular anatomy. Carotid stenosis
measurements (when applicable) are obtained utilizing NASCET
criteria, using the distal internal carotid diameter as the
denominator.
CONTRAST:  100mL OMNIPAQUE IOHEXOL 350 MG/ML SOLN

[Series 12: cta head neck thins · axial · 0.43mm/px · z∈[-362,-151]mm · 5 of 635 slices shown]
[im 106/635  soft-tissue]
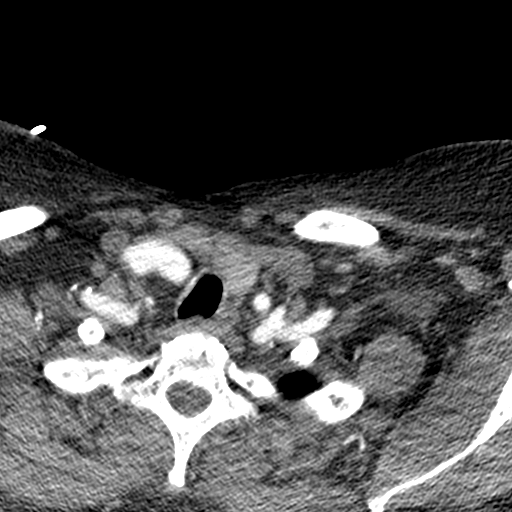
[im 212/635  bone]
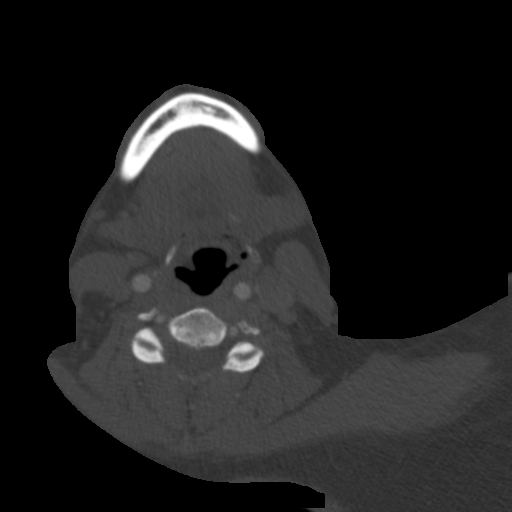
[im 318/635  soft-tissue]
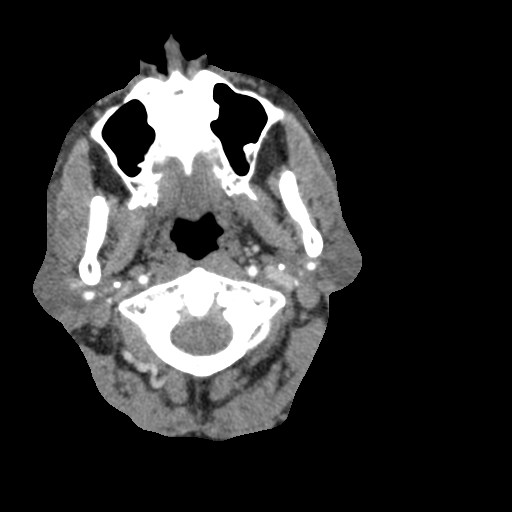
[im 423/635  bone]
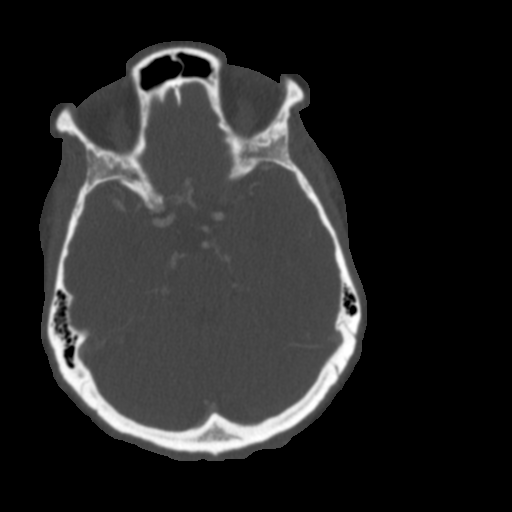
[im 529/635  soft-tissue]
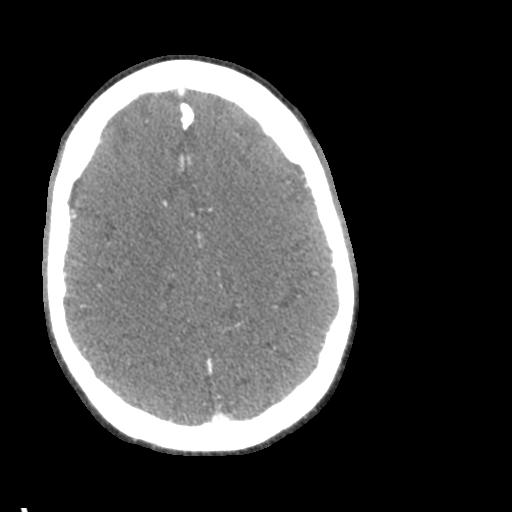

[Series 13: ax thin · axial · 0.39mm/px · z∈[-309,-204]mm · 2 of 316 slices shown]
[im 106/316  soft-tissue]
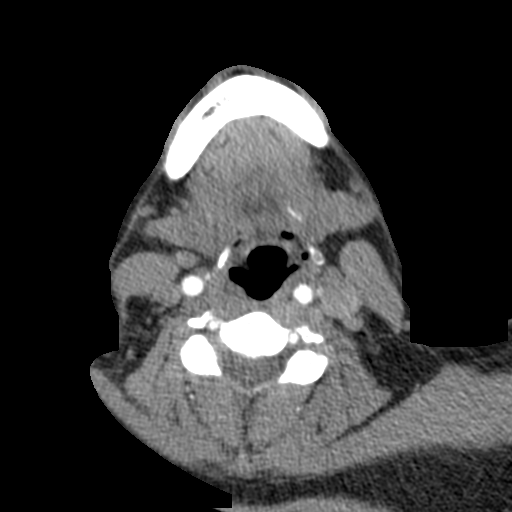
[im 211/316  soft-tissue]
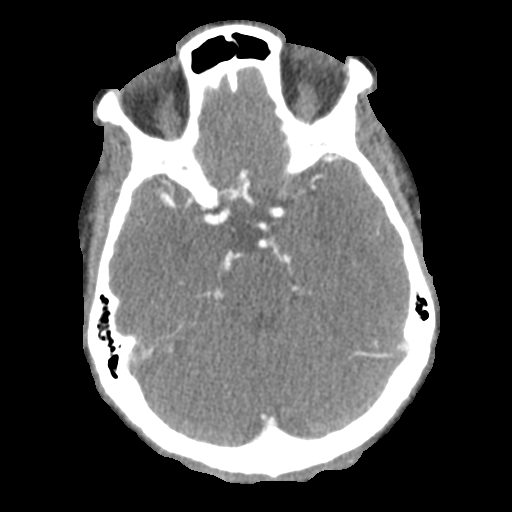

[7 of 33 positions shown; findings below may reference images not displayed]

FINDINGS: CT HEAD FINDINGS

Brain: Old infarction in the pons. No focal cerebellar finding.
Cerebral hemispheres show old lacunar infarctions of the thalami,
right basal ganglia and affecting the cerebral hemispheric white
matter. No large vessel territory infarction. No mass lesion,
hemorrhage, hydrocephalus or extra-axial collection.

Vascular: There is atherosclerotic calcification of the major
vessels at the base of the brain.

Skull: Negative

Sinuses: Clear

Orbits: Normal

Review of the MIP images confirms the above findings

CTA NECK FINDINGS

Aortic arch: Normal

Right carotid system: Common carotid artery widely patent the
bifurcation. Calcified plaque at the carotid bifurcation and ICA
bulb but no stenosis. Cervical ICA widely patent.

Left carotid system: Common carotid artery widely patent to the
bifurcation. Calcified plaque at the carotid bifurcation and ICA
bulb but no stenosis. Cervical ICA widely patent.

Vertebral arteries: Both vertebral artery origins widely patent.
Both vertebral arteries widely patent through the cervical region to
the foramen magnum.

Skeleton: Mild cervical spondylosis.

Other neck: No soft tissue mass or lymphadenopathy.

Upper chest: Normal

Review of the MIP images confirms the above findings

CTA HEAD FINDINGS

Anterior circulation: Both internal carotid arteries are patent
through the skull base and siphon regions. There is calcified plaque
in both carotid siphon regions but no stenosis greater than 50%
suspected. The anterior and middle cerebral vessels are patent. No
large or medium vessel occlusion is identified. No correctable
proximal stenosis. No aneurysm or vascular malformation.

Posterior circulation: Both vertebral arteries are patent at the
foramen magnum. There is calcified plaque in both vertebral V4
segments but no stenosis greater than 30%. Both vertebral arteries
reach the basilar. No basilar stenosis. Posterior circulation branch
vessels are patent.

Venous sinuses: Patent and normal.

Anatomic variants: None significant.

Review of the MIP images confirms the above findings
IMPRESSION: No large or medium vessel intracranial occlusion.

Atherosclerotic disease at both carotid bifurcations but no stenosis
or significant irregularity.

Atherosclerotic change in both carotid siphon regions and both
vertebral artery V4 segments, but no flow limiting stenosis.

## 2020-05-12 LAB — CUP PACEART REMOTE DEVICE CHECK
Date Time Interrogation Session: 20210906112719
Implantable Pulse Generator Implant Date: 20210217

## 2020-05-14 ENCOUNTER — Telehealth: Payer: Self-pay

## 2020-05-14 NOTE — Telephone Encounter (Signed)
The pt daughter Stephan Minister [phone], states the pt passed away on 05-30-2020. She also states the pt passed out on 05-08-2020. She wants to know if anything showed up on the last transmission. She wanted to know if her passing out was heart related. I told her the nurse will give her a call back.

## 2020-05-14 NOTE — Telephone Encounter (Signed)
Stephan Minister( daughter) is listed on DPR. She was informed that Dr Curt Bears had reviewed the last transmission received on 05/11/20 and noted that she had no arrhythmias. No events on transmissions that correlated with the patient's syncopal episode on 05/08/20. Stephan Minister expressed her appreciation for the care her mother received from [facility].

## 2020-06-04 DEATH — deceased

## 2020-06-05 IMAGING — DX DG ABDOMEN 1V
2 series · 2 of 2 positions shown · non-contrast
Comparison: Radiographs 11/04/2019. No other relevant comparison
studies.

CLINICAL DATA: Chronic constipation with blood in stool and
abdominal pain for years.

EXAM:
ABDOMEN - 1 VIEW

[abdomen kub (1 of 2)]
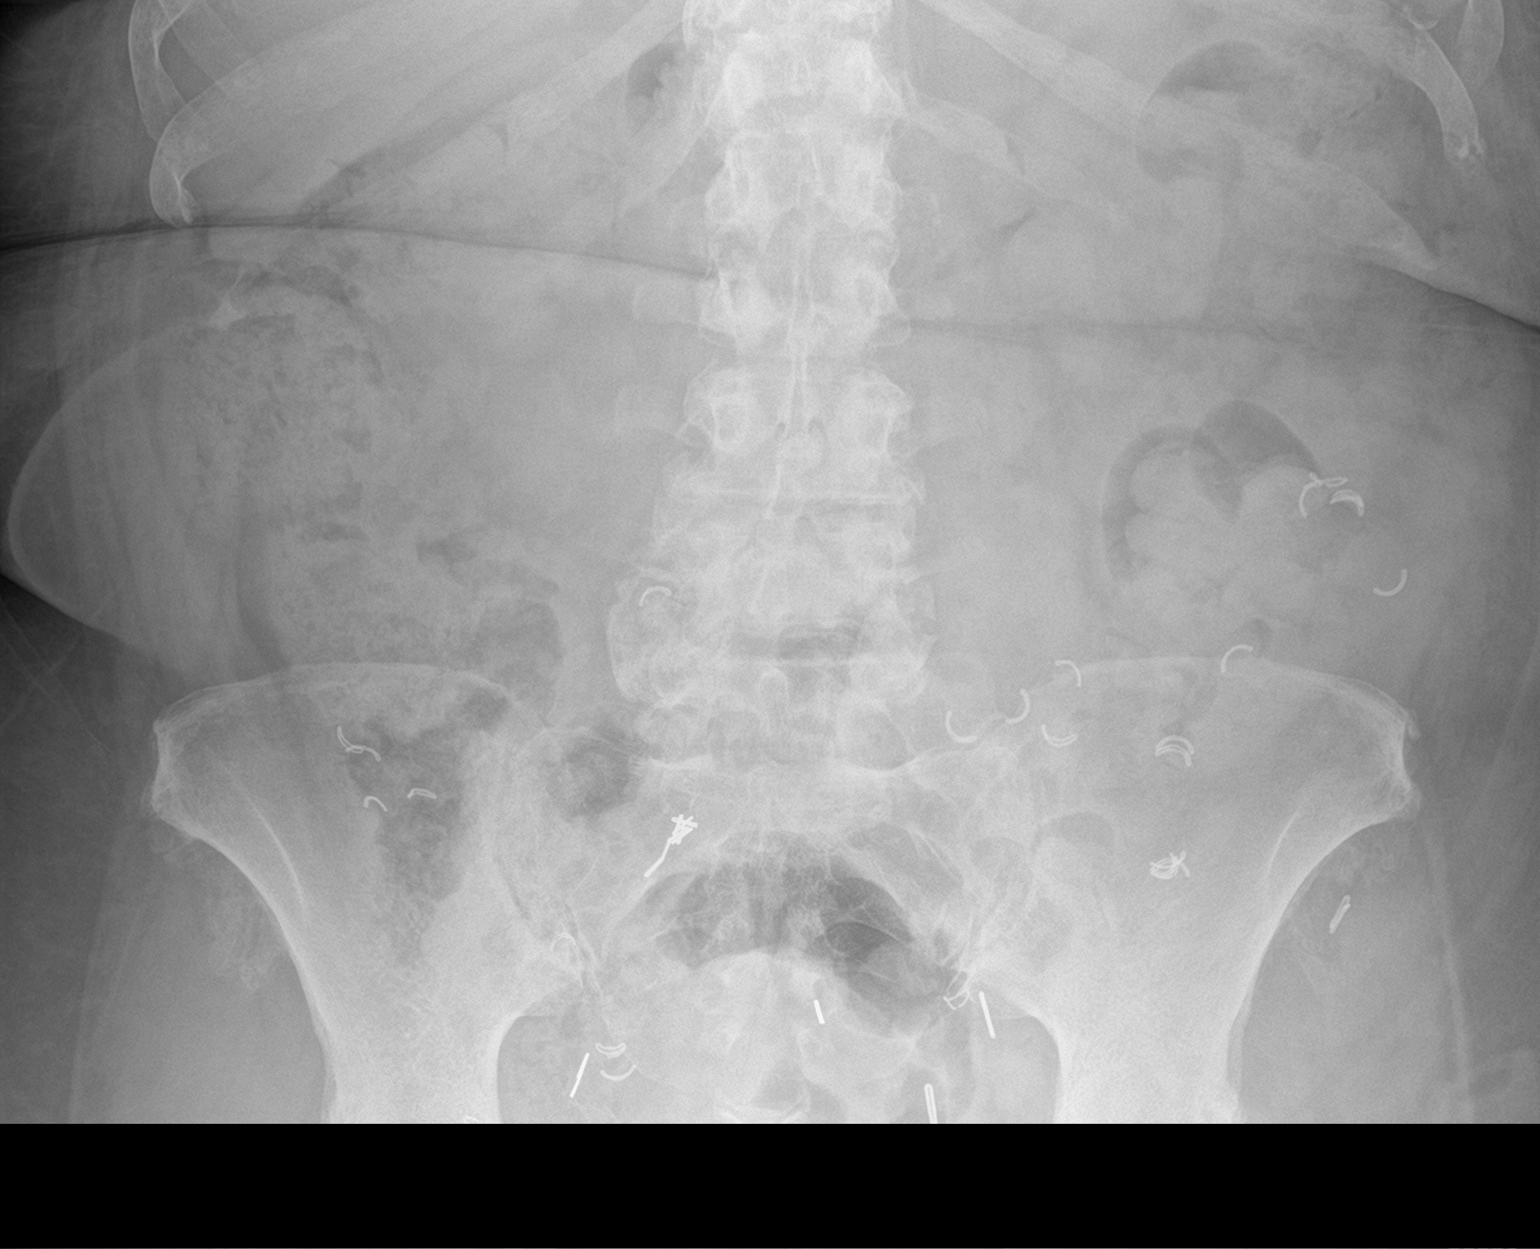

[abdomen kub (2 of 2)]
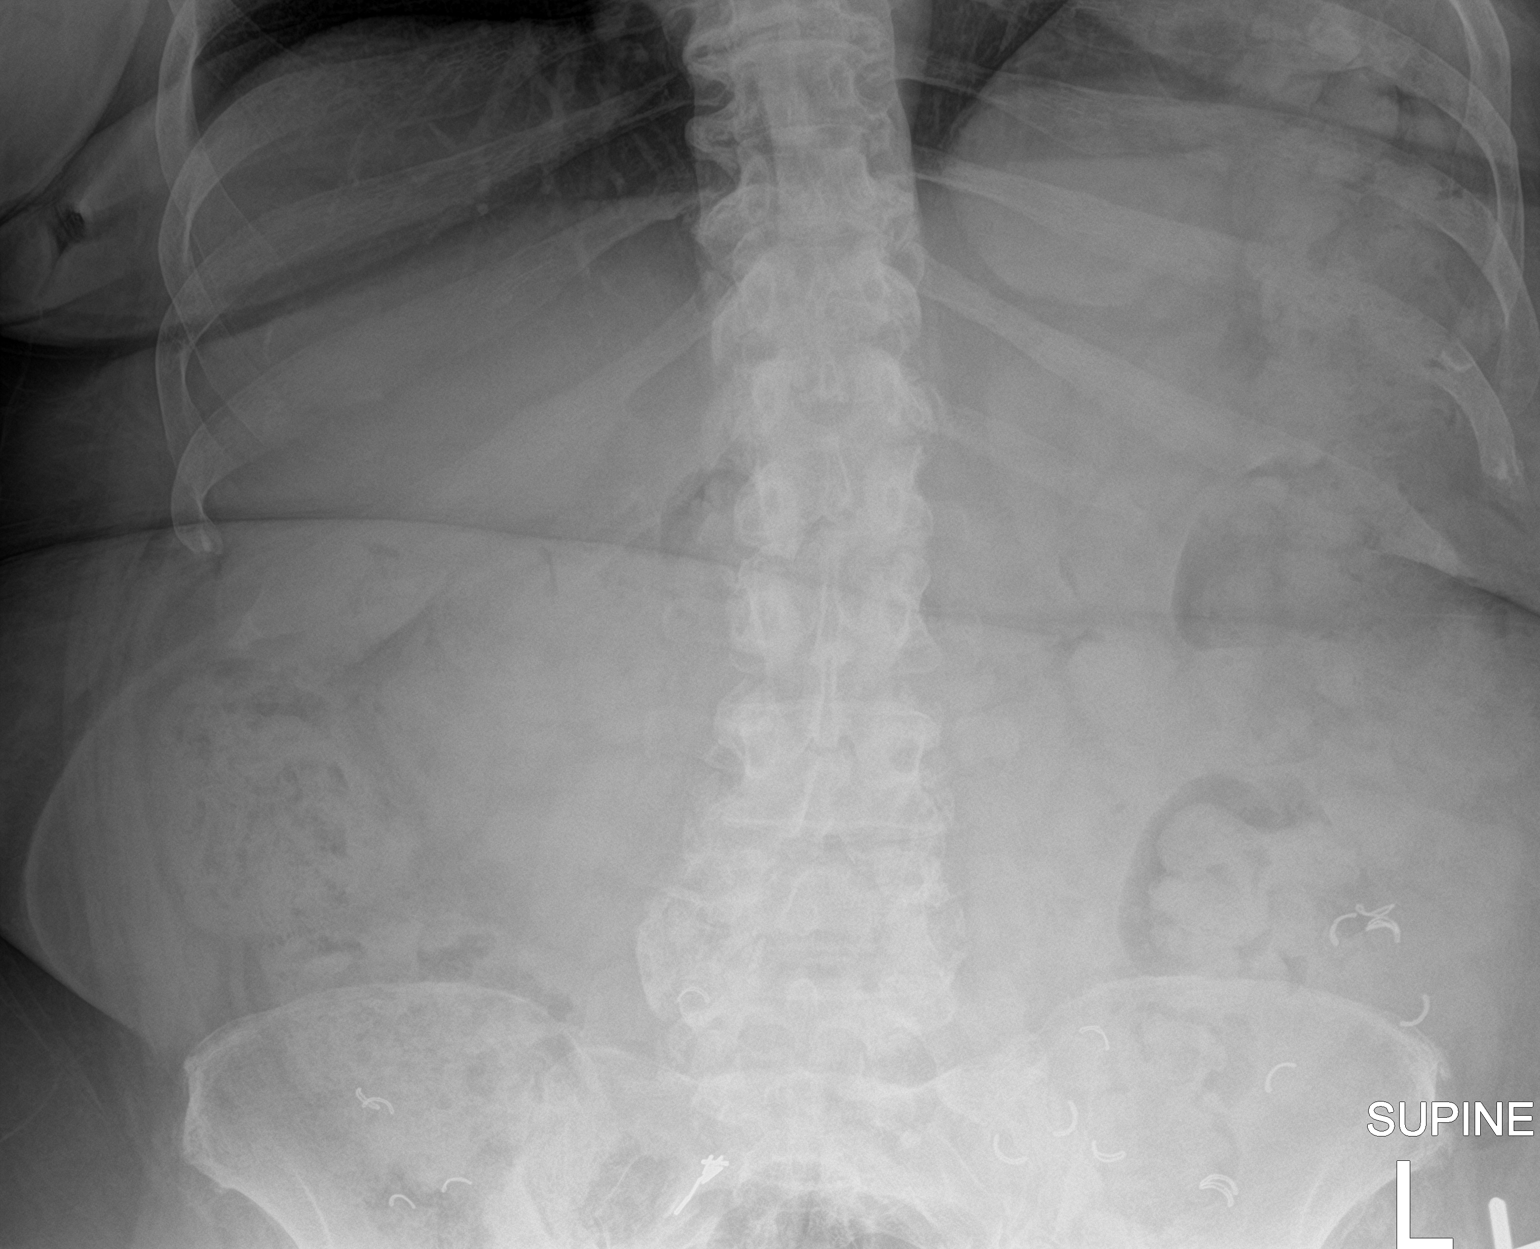

[2 of 2 positions shown; findings below may reference images not displayed]

FINDINGS: The bowel gas pattern is nonobstructive. There is no evidence of
bowel wall thickening. A moderate to large amount of stool is again
noted throughout the colon. There are multiple postsurgical clips
within the lower abdomen and pelvis. Degenerative changes are
present throughout the lumbar spine.
IMPRESSION: No acute abdominal findings. Moderate to large amount of colonic
stool, similar to previous study.
